# Patient Record
Sex: Male | Born: 1950 | Race: White | Hispanic: No | Smoking: Never smoker
Health system: Southern US, Community
[De-identification: ages and names within clinical notes are randomized; demographics above are authoritative.]

## PROBLEM LIST (undated history)

## (undated) DIAGNOSIS — F329 Major depressive disorder, single episode, unspecified: Secondary | ICD-10-CM

## (undated) DIAGNOSIS — F419 Anxiety disorder, unspecified: Secondary | ICD-10-CM

## (undated) DIAGNOSIS — F32A Depression, unspecified: Secondary | ICD-10-CM

## (undated) DIAGNOSIS — F29 Unspecified psychosis not due to a substance or known physiological condition: Secondary | ICD-10-CM

## (undated) DIAGNOSIS — I10 Essential (primary) hypertension: Secondary | ICD-10-CM

## (undated) DIAGNOSIS — K219 Gastro-esophageal reflux disease without esophagitis: Secondary | ICD-10-CM

## (undated) DIAGNOSIS — M6281 Muscle weakness (generalized): Secondary | ICD-10-CM

## (undated) DIAGNOSIS — E669 Obesity, unspecified: Secondary | ICD-10-CM

## (undated) DIAGNOSIS — G20A1 Parkinson's disease without dyskinesia, without mention of fluctuations: Secondary | ICD-10-CM

## (undated) DIAGNOSIS — Z87442 Personal history of urinary calculi: Secondary | ICD-10-CM

## (undated) DIAGNOSIS — G8929 Other chronic pain: Secondary | ICD-10-CM

## (undated) DIAGNOSIS — M1990 Unspecified osteoarthritis, unspecified site: Secondary | ICD-10-CM

## (undated) DIAGNOSIS — G4730 Sleep apnea, unspecified: Secondary | ICD-10-CM

## (undated) DIAGNOSIS — G20 Parkinson's disease: Secondary | ICD-10-CM

## (undated) DIAGNOSIS — R7303 Prediabetes: Secondary | ICD-10-CM

## (undated) HISTORY — PX: CYSTOSCOPY: SHX5120

## (undated) HISTORY — PX: HERNIA REPAIR: SHX51

---

## 1980-04-04 HISTORY — PX: APPENDECTOMY: SHX54

## 1990-04-04 DIAGNOSIS — Z87442 Personal history of urinary calculi: Secondary | ICD-10-CM

## 1990-04-04 HISTORY — DX: Personal history of urinary calculi: Z87.442

## 2010-11-29 ENCOUNTER — Encounter (HOSPITAL_COMMUNITY)
Admission: RE | Admit: 2010-11-29 | Discharge: 2010-11-29 | Disposition: A | Discharge status: Home / Self Care | Payer: Medicare Other | Source: Ambulatory Visit | Attending: Ophthalmology | Admitting: Ophthalmology

## 2010-11-29 ENCOUNTER — Encounter (HOSPITAL_COMMUNITY): Payer: Self-pay

## 2010-11-29 HISTORY — DX: Anxiety disorder, unspecified: F41.9

## 2010-11-29 HISTORY — DX: Unspecified osteoarthritis, unspecified site: M19.90

## 2010-11-29 LAB — BASIC METABOLIC PANEL
BUN: 14 mg/dL (ref 6–23)
Chloride: 101 mEq/L (ref 96–112)
Creatinine, Ser: 0.83 mg/dL (ref 0.50–1.35)
GFR calc Af Amer: >60 mL/min (ref >60)
Glucose, Bld: 75 mg/dL (ref 70–99)

## 2010-11-29 LAB — CBC
HCT: 43.7 % (ref 39.0–52.0)
MCHC: 33.4 g/dL (ref 30.0–36.0)
MCV: 94.4 fL (ref 78.0–100.0)
RDW: 13.5 % (ref 11.5–15.5)
WBC: 8.3 10*3/uL (ref 4.0–10.5)

## 2010-11-29 NOTE — Patient Instructions (Addendum)
19 Joe [name]  11/29/2010   Your procedure is scheduled on:  12/09/2010  Report to [facility] at 1130  AM.  Call this number if you have problems the morning of surgery: [phone]   Remember:   Do not eat food:After Midnight.  Do not drink clear liquids: After Midnight.  Take these medicines the morning of surgery with A SIP OF WATER:none   Do not wear jewelry, make-up or nail polish.  Do not wear lotions, powders, or perfumes. You may wear deodorant.  Do not shave 48 hours prior to surgery.  Do not bring valuables to the hospital.  Contacts, dentures or bridgework may not be worn into surgery.  Leave suitcase in the car. After surgery it may be brought to your room.  For patients admitted to the hospital, checkout time is 11:00 AM the day of discharge.   Patients discharged the day of surgery will not be allowed to drive home.  Name and phone number of your driver: family  Special Instructions: N/A   Please read over the following fact sheets that you were given: Pain Booklet, Surgical Site Infection Prevention, Anesthesia Post-op Instructions and Care and Recovery After Surgery PATIENT INSTRUCTIONS POST-ANESTHESIA  IMMEDIATELY FOLLOWING SURGERY:  Do not drive or operate machinery for the first twenty four hours after surgery.  Do not make any important decisions for twenty four hours after surgery or while taking narcotic pain medications or sedatives.  If you develop intractable nausea and vomiting or a severe headache please notify your doctor immediately.  FOLLOW-UP:  Please make an appointment with your surgeon as instructed. You do not need to follow up with anesthesia unless specifically instructed to do so.  WOUND CARE INSTRUCTIONS (if applicable):  Keep a dry clean dressing on the anesthesia/puncture wound site if there is drainage.  Once the wound has quit draining you may leave it open to air.  Generally you should leave the bandage intact for twenty four hours unless there  is drainage.  If the epidural site drains for more than 36-48 hours please call the anesthesia department.  QUESTIONS?:  Please feel free to call your physician or the hospital operator if you have any questions, and they will be happy to assist you.     [facility] Anesthesia Department [address] [place] [phone]

## 2010-12-03 ENCOUNTER — Emergency Department (HOSPITAL_COMMUNITY)
Admission: EM | Admit: 2010-12-03 | Discharge: 2010-12-03 | Disposition: A | Discharge status: Home / Self Care | Payer: Medicare Other | Attending: Emergency Medicine | Admitting: Emergency Medicine

## 2010-12-03 ENCOUNTER — Encounter (HOSPITAL_COMMUNITY): Payer: Self-pay | Admitting: *Deleted

## 2010-12-03 DIAGNOSIS — F3289 Other specified depressive episodes: Secondary | ICD-10-CM | POA: Insufficient documentation

## 2010-12-03 DIAGNOSIS — F329 Major depressive disorder, single episode, unspecified: Secondary | ICD-10-CM | POA: Insufficient documentation

## 2010-12-03 NOTE — ED Provider Notes (Signed)
History     CSN: [id number] Arrival date & time: 12/03/2010  7:48 PM  Chief Complaint  Patient presents with  . Depression   HPI Comments: Patient admits to having chronic depression for the last 20 years since his wife left him. He endorses ongoing depression related to the fact that he does not see his children very often. He denies suicidal ideation, hallucinations, overdose, other self-harm. He denies any physical abnormalities either. Symptoms are constant, nothing makes her worse. Currently taking piroxicam which she states does help. He is not scheduled for a followup with a psychiatrist.  The history is provided by the patient.    Past Medical History  Diagnosis Date  . Anxiety   . Arthritis     osteoarthritis  . DJD (degenerative joint disease)     Past Surgical History  Procedure Date  . Cystoscopy     with kidney stone removal-mmh  . Appendectomy 81     mmh  . Hernia repair     umbilical hernia repair-mmh    Family History  Problem Relation Age of Onset  . Anesthesia problems Neg Hx   . Hypotension Neg Hx   . Malignant hyperthermia Neg Hx   . Pseudochol deficiency Neg Hx     History  Substance Use Topics  . Smoking status: Never Smoker   . Smokeless tobacco: Not on file  . Alcohol Use: No      Review of Systems  All other systems reviewed and are negative.    Physical Exam  BP 142/78  Pulse 79  Temp(Src) 98.1 F (36.7 C) (Oral)  Resp 20  Ht 5\' 7"  (1.702 m)  Wt 250 lb (113.399 kg)  BMI 39.16 kg/m2  SpO2 94%  Physical Exam  Nursing note and vitals reviewed. Constitutional: He appears well-developed and well-nourished. No distress.  HENT:  Head: Normocephalic and atraumatic.  Mouth/Throat: Oropharynx is clear and moist. No oropharyngeal exudate.  Eyes: Conjunctivae and EOM are normal. Pupils are equal, round, and reactive to light. Right eye exhibits no discharge. Left eye exhibits no discharge. No scleral icterus.  Neck: Normal range of  motion. Neck supple. No JVD present. No thyromegaly present.  Cardiovascular: Normal rate, regular rhythm, normal heart sounds and intact distal pulses.  Exam reveals no gallop and no friction rub.   No murmur heard. Pulmonary/Chest: Effort normal and breath sounds normal. No respiratory distress. He has no wheezes. He has no rales.  Abdominal: Soft. Bowel sounds are normal. He exhibits no distension and no mass. There is no tenderness.  Musculoskeletal: Normal range of motion. He exhibits no edema and no tenderness.  Lymphadenopathy:    He has no cervical adenopathy.  Neurological: He is alert. Coordination normal.  Skin: Skin is warm and dry. No rash noted. No erythema.  Psychiatric: His behavior is normal. Thought content normal.       Mild depression, no suicidal thoughts, no hallucinations.    ED Course  Procedures  MDM Patient overall is well appearing though he has ongoing mild depression.  I have asked the behavioral health consult service to evaluate him and they agree that he can followup as an outpatient. He agrees to followup on Tuesday with a psychiatric followup surface. There is no risk for suicide at this time.      Vida Roller, MD 12/03/10 2118

## 2010-12-03 NOTE — ED Notes (Signed)
PT SELF AMBULATED OUT WITH A STEADY GAITA STATING NO NEEDS

## 2010-12-03 NOTE — ED Notes (Signed)
C/o depression and feeling nauseated

## 2010-12-09 ENCOUNTER — Encounter (HOSPITAL_COMMUNITY): Payer: Self-pay | Admitting: *Deleted

## 2010-12-09 ENCOUNTER — Encounter (HOSPITAL_COMMUNITY): Payer: Self-pay | Admitting: Anesthesiology

## 2010-12-09 ENCOUNTER — Ambulatory Visit (HOSPITAL_COMMUNITY)
Admission: RE | Admit: 2010-12-09 | Discharge: 2010-12-09 | Disposition: A | Discharge status: Home / Self Care | Payer: Medicare Other | Source: Ambulatory Visit | Attending: Ophthalmology | Admitting: Ophthalmology

## 2010-12-09 ENCOUNTER — Ambulatory Visit (HOSPITAL_COMMUNITY): Payer: Medicare Other | Admitting: Anesthesiology

## 2010-12-09 ENCOUNTER — Encounter (HOSPITAL_COMMUNITY)
Admission: RE | Disposition: A | Discharge status: Home / Self Care | Payer: Self-pay | Source: Ambulatory Visit | Attending: Ophthalmology

## 2010-12-09 DIAGNOSIS — Z01810 Encounter for preprocedural cardiovascular examination: Secondary | ICD-10-CM | POA: Insufficient documentation

## 2010-12-09 DIAGNOSIS — Z01812 Encounter for preprocedural laboratory examination: Secondary | ICD-10-CM | POA: Insufficient documentation

## 2010-12-09 DIAGNOSIS — IMO0002 Reserved for concepts with insufficient information to code with codable children: Secondary | ICD-10-CM | POA: Insufficient documentation

## 2010-12-09 HISTORY — PX: CATARACT EXTRACTION W/PHACO: SHX586

## 2010-12-09 SURGERY — PHACOEMULSIFICATION, CATARACT, WITH IOL INSERTION
Anesthesia: Monitor Anesthesia Care | Site: Eye | Laterality: Right | Wound class: Clean

## 2010-12-09 MED ORDER — MIDAZOLAM HCL 2 MG/2ML IJ SOLN
1.0000 mg | INTRAMUSCULAR | Status: DC | PRN
  Administered 2010-12-09 (×2): 2 mg via INTRAVENOUS

## 2010-12-09 MED ORDER — LIDOCAINE HCL (PF) 1 % IJ SOLN
INTRAMUSCULAR | Status: DC | PRN
  Administered 2010-12-09: .5 mL

## 2010-12-09 MED ORDER — LACTATED RINGERS IV SOLN
INTRAVENOUS | Status: DC
  Administered 2010-12-09: 1000 mL via INTRAVENOUS

## 2010-12-09 MED ORDER — POVIDONE-IODINE 5 % OP SOLN
OPHTHALMIC | Status: DC | PRN
  Administered 2010-12-09: 1 via OPHTHALMIC

## 2010-12-09 MED ORDER — EPINEPHRINE HCL 1 MG/ML IJ SOLN
INTRAOCULAR | Status: DC | PRN
  Administered 2010-12-09: 13:00:00

## 2010-12-09 MED ORDER — PHENYLEPHRINE HCL 2.5 % OP SOLN
1.0000 [drp] | OPHTHALMIC | Status: AC
  Administered 2010-12-09 (×3): 1 [drp] via OPHTHALMIC

## 2010-12-09 MED ORDER — NEOMYCIN-POLYMYXIN-DEXAMETH 0.1 % OP OINT
TOPICAL_OINTMENT | OPHTHALMIC | Status: DC | PRN
  Administered 2010-12-09: 1 via OPHTHALMIC

## 2010-12-09 MED ORDER — FENTANYL CITRATE 0.05 MG/ML IJ SOLN
INTRAMUSCULAR | Status: AC
  Filled 2010-12-09: qty 2

## 2010-12-09 MED ORDER — MIDAZOLAM HCL 5 MG/5ML IJ SOLN
INTRAMUSCULAR | Status: AC
  Administered 2010-12-09: 2 mg via INTRAVENOUS
  Filled 2010-12-09: qty 5

## 2010-12-09 MED ORDER — CYCLOPENTOLATE-PHENYLEPHRINE 0.2-1 % OP SOLN
OPHTHALMIC | Status: AC
  Administered 2010-12-09: 1 [drp] via OPHTHALMIC
  Filled 2010-12-09: qty 2

## 2010-12-09 MED ORDER — LACTATED RINGERS IV SOLN: INTRAVENOUS | Status: DC

## 2010-12-09 MED ORDER — PHENYLEPHRINE HCL 2.5 % OP SOLN
OPHTHALMIC | Status: AC
  Administered 2010-12-09: 1 [drp] via OPHTHALMIC
  Filled 2010-12-09: qty 2

## 2010-12-09 MED ORDER — LIDOCAINE HCL (PF) 1 % IJ SOLN
INTRAMUSCULAR | Status: AC
  Filled 2010-12-09: qty 2

## 2010-12-09 MED ORDER — BSS IO SOLN
INTRAOCULAR | Status: DC | PRN
  Administered 2010-12-09: 15 mL via OPHTHALMIC

## 2010-12-09 MED ORDER — TETRACAINE HCL 0.5 % OP SOLN
1.0000 [drp] | OPHTHALMIC | Status: AC
  Administered 2010-12-09 (×3): 1 [drp] via OPHTHALMIC

## 2010-12-09 MED ORDER — FENTANYL CITRATE 0.05 MG/ML IJ SOLN
INTRAMUSCULAR | Status: DC | PRN
  Administered 2010-12-09 (×2): 50 ug via INTRAVENOUS

## 2010-12-09 MED ORDER — LIDOCAINE HCL 3.5 % OP GEL
OPHTHALMIC | Status: AC
  Filled 2010-12-09: qty 5

## 2010-12-09 MED ORDER — LIDOCAINE HCL 3.5 % OP GEL
OPHTHALMIC | Status: DC | PRN
  Administered 2010-12-09: 1 via OPHTHALMIC

## 2010-12-09 MED ORDER — FENTANYL CITRATE 0.05 MG/ML IJ SOLN: 25.0000 ug | INTRAMUSCULAR | Status: DC | PRN

## 2010-12-09 MED ORDER — TRYPAN BLUE 0.06 % OP SOLN
OPHTHALMIC | Status: DC | PRN
  Administered 2010-12-09: 1 via INTRAOCULAR

## 2010-12-09 MED ORDER — TETRACAINE HCL 0.5 % OP SOLN
OPHTHALMIC | Status: AC
  Administered 2010-12-09: 1 [drp] via OPHTHALMIC
  Filled 2010-12-09: qty 2

## 2010-12-09 MED ORDER — NEOMYCIN-POLYMYXIN-DEXAMETH 3.5-10000-0.1 OP OINT
TOPICAL_OINTMENT | OPHTHALMIC | Status: AC
  Filled 2010-12-09: qty 3.5

## 2010-12-09 MED ORDER — ONDANSETRON HCL 4 MG/2ML IJ SOLN: 4.0000 mg | Freq: Once | INTRAMUSCULAR | Status: DC | PRN

## 2010-12-09 MED ORDER — PROVISC 10 MG/ML IO SOLN
INTRAOCULAR | Status: DC | PRN
  Administered 2010-12-09: 8.5 mg via INTRAOCULAR

## 2010-12-09 MED ORDER — TRYPAN BLUE 0.06 % OP SOLN
OPHTHALMIC | Status: AC
  Filled 2010-12-09: qty 0.5

## 2010-12-09 MED ORDER — EPINEPHRINE HCL 1 MG/ML IJ SOLN
INTRAMUSCULAR | Status: AC
  Filled 2010-12-09: qty 1

## 2010-12-09 MED ORDER — CYCLOPENTOLATE-PHENYLEPHRINE 0.2-1 % OP SOLN
1.0000 [drp] | OPHTHALMIC | Status: AC
  Administered 2010-12-09 (×3): 1 [drp] via OPHTHALMIC

## 2010-12-09 MED ORDER — LIDOCAINE HCL 3.5 % OP GEL: 1.0000 "application " | Freq: Once | OPHTHALMIC | Status: DC

## 2010-12-09 SURGICAL SUPPLY — 34 items
CAPSULAR TENSION RING-AMO (OPHTHALMIC RELATED) IMPLANT
CLOTH BEACON ORANGE TIMEOUT ST (SAFETY) ×1 IMPLANT
DUOVISC SYSTEM (INTRAOCULAR LENS)
EYE SHIELD UNIVERSAL CLEAR (GAUZE/BANDAGES/DRESSINGS) ×1 IMPLANT
GLOVE BIO SURGEON STRL SZ 6.5 (GLOVE) IMPLANT
GLOVE BIOGEL PI IND STRL 6.5 (GLOVE) IMPLANT
GLOVE BIOGEL PI IND STRL 7.0 (GLOVE) IMPLANT
GLOVE BIOGEL PI IND STRL 7.5 (GLOVE) IMPLANT
GLOVE BIOGEL PI INDICATOR 6.5 (GLOVE)
GLOVE BIOGEL PI INDICATOR 7.0 (GLOVE)
GLOVE BIOGEL PI INDICATOR 7.5 (GLOVE) ×1
GLOVE ECLIPSE 6.5 STRL STRAW (GLOVE) IMPLANT
GLOVE ECLIPSE 7.0 STRL STRAW (GLOVE) ×1 IMPLANT
GLOVE ECLIPSE 7.5 STRL STRAW (GLOVE) IMPLANT
GLOVE EXAM NITRILE LRG STRL (GLOVE) ×1 IMPLANT
GLOVE EXAM NITRILE MD LF STRL (GLOVE) IMPLANT
GLOVE SKINSENSE NS SZ6.5 (GLOVE)
GLOVE SKINSENSE NS SZ7.0 (GLOVE)
GLOVE SKINSENSE STRL SZ6.5 (GLOVE) IMPLANT
GLOVE SKINSENSE STRL SZ7.0 (GLOVE) IMPLANT
KIT VITRECTOMY (OPHTHALMIC RELATED) IMPLANT
PAD ARMBOARD 7.5X6 YLW CONV (MISCELLANEOUS) ×1 IMPLANT
PROC W NO LENS (INTRAOCULAR LENS)
PROC W SPEC LENS (INTRAOCULAR LENS)
PROCESS W NO LENS (INTRAOCULAR LENS) IMPLANT
PROCESS W SPEC LENS (INTRAOCULAR LENS) IMPLANT
RING MALYGIN (MISCELLANEOUS) IMPLANT
SIGHTPATH CAT PROC W REG LENS (Ophthalmic Related) ×2 IMPLANT
SYR TB 1ML LL NO SAFETY (SYRINGE) ×1 IMPLANT
SYSTEM DUOVISC (INTRAOCULAR LENS) IMPLANT
TAPE SURG TRANSPORE 1 IN (GAUZE/BANDAGES/DRESSINGS) IMPLANT
TAPE SURGICAL TRANSPORE 1 IN (GAUZE/BANDAGES/DRESSINGS) ×1
VISCOELASTIC ADDITIONAL (OPHTHALMIC RELATED) IMPLANT
WATER STERILE IRR 250ML POUR (IV SOLUTION) ×1 IMPLANT

## 2010-12-09 NOTE — Anesthesia Postprocedure Evaluation (Signed)
  Anesthesia Post-op Note  Patient: Joe [name]  Procedure(s) Performed:  CATARACT EXTRACTION PHACO AND INTRAOCULAR LENS PLACEMENT (IOC) - CDE: 36.21  Patient Location:  Short Stay  Anesthesia Type: MAC  Level of Consciousness: awake  Airway and Oxygen Therapy: Patient Spontanous Breathing  Post-op Pain: none  Post-op Assessment: Post-op Vital signs reviewed, Patient's Cardiovascular Status Stable, Respiratory Function Stable, Patent Airway, No signs of Nausea or vomiting and Pain level controlled  Post-op Vital Signs: Reviewed and stable  Complications: No apparent anesthesia complications

## 2010-12-09 NOTE — H&P (Signed)
I have evaluated the patient preoperatively, and have identified no interval changes in medical condition and plan of care since the history and physical of record 

## 2010-12-09 NOTE — Brief Op Note (Signed)
Pre-Op Dx: Mature Cataract OD Post-Op Dx: Mature Cataract OD Surgeon: Gemma Payor Anesthesia: Topical with MAC Implant: Lenstec, Model Softec HD Blood Loss: None Specimen: None Complications: None

## 2010-12-09 NOTE — Anesthesia Procedure Notes (Signed)
Procedure Name: MAC Date/Time: 12/09/2010 12:31 PM Performed by: Minerva Areola Pre-anesthesia Checklist: Suction available, Emergency Drugs available, Patient identified, Patient being monitored and Timeout performed Oxygen Delivery Method: Nasal Cannula

## 2010-12-09 NOTE — Transfer of Care (Signed)
Immediate Anesthesia Transfer of Care Note  Patient: Joe [name]  Procedure(s) Performed:  CATARACT EXTRACTION PHACO AND INTRAOCULAR LENS PLACEMENT (IOC) - CDE: 36.21  Patient Location: Shortstay  Anesthesia Type: MAC  Level of Consciousness: awake  Airway & Oxygen Therapy: Patient Spontanous Breathing   Post-op Assessment: Report given to PACU RN, Post -op Vital signs reviewed and stable and Patient moving all extremities  Post vital signs: Reviewed and stable  Complications: No apparent anesthesia complications

## 2010-12-09 NOTE — Op Note (Signed)
NAME:  Joe [name], Joe [name] NO.:  [id number]  MEDICAL RECORD NO.:  [id number]  LOCATION:  [facility]                          FACILITY:  [facility]  PHYSICIAN:  Susanne Greenhouse, MD       DATE OF BIRTH:  [birth year]  DATE OF PROCEDURE:  12/09/2010 DATE OF DISCHARGE:  12/09/2010                              OPERATIVE REPORT   PREOPERATIVE DIAGNOSIS:  Mature cataract, right eye.  POSTOPERATIVE DIAGNOSIS:  Mature cataract, right eye.  DIAGNOSIS CODE:  366.17.  SURGEON:  Susanne Greenhouse, MD.  ANESTHESIA:  Topical with monitored anesthesia care.  DESCRIPTION OF THE OPERATION:  In the preoperative holding area, dilating drops and viscous lidocaine were placed into the right eye. The patient was then brought to the operating room where he was prepped and draped.  Beginning with 75 blade, a paracentesis port was made at the surgeon's 2 o'clock position.  The anterior chamber was then filled with a 1% nonpreserved lidocaine solution.  This was followed by small air bubble and vision blue.  The vision blue was then displaced from the anterior chamber with balanced salt solution.  The anterior chamber was then filled with Provisc.  A 2.4-mm keratome blade was then used to make a clear corneal incision at the temporal limbus.  A bent cystotome needle and Utrata forceps were used to create a continuous tear capsulotomy.  The lens nucleus was then removed using phacoemulsification and a phaco flip technique.  After removal of the nucleus, there really was no cortex to be removed and so the capsular bag and anterior chamber were refilled with Provisc.  The posterior chamber interocular lens was placed into the capsular bag without difficulty using a lens injecting system.  The Provisc was then removed from the capsular bag and anterior chamber with irrigation and aspiration.  Stromal hydration of the main incision and paracentesis ports was performed with balanced salt solution on a fine cannula.   The wounds were tested for leak which were negative.  The patient tolerated the procedure well.  There were no operative complications and he was returned to the recovery area in satisfactory condition.  No surgical specimens.  Prosthetic device used is a Lenstec posterior chamber lens, model Softec HD, power of 18.0, serial number is [id number].         ______________________________ Susanne Greenhouse, MD    KEH/MEDQ  D:  12/09/2010  T:  12/09/2010  Job:  [id number]

## 2010-12-09 NOTE — Anesthesia Preprocedure Evaluation (Addendum)
Anesthesia Evaluation  Name, MR# and DOB Patient awake  General Assessment Comment  Reviewed: Allergy & Precautions, H&P , NPO status , Patient's Chart, lab work & pertinent test results  Airway Mallampati: III    Mouth opening: Limited Mouth Opening  Dental  (+) Edentulous Upper and Edentulous Lower   Pulmonary      Cardiovascular Regular Normal    Neuro/Psych    (+) Anxiety,    GI/Hepatic/Renal        GERD    Renal disease (stones)  Endo/Other    Abdominal   Musculoskeletal  (+) Arthritis -, Osteoarthritis,    Hematology   Peds  Reproductive/Obstetrics    Anesthesia Other Findings             Anesthesia Physical Anesthesia Plan  ASA: III  Anesthesia Plan: MAC   Post-op Pain Management:    Induction: Intravenous  Airway Management Planned: Nasal Cannula  Additional Equipment:   Intra-op Plan:   Post-operative Plan:   Informed Consent: I have reviewed the patients History and Physical, chart, labs and discussed the procedure including the risks, benefits and alternatives for the proposed anesthesia with the patient or authorized representative who has indicated his/her understanding and acceptance.     Plan Discussed with:   Anesthesia Plan Comments:         Anesthesia Quick Evaluation

## 2010-12-10 NOTE — Progress Notes (Signed)
No answer at provided number.

## 2010-12-15 ENCOUNTER — Encounter (HOSPITAL_COMMUNITY): Payer: Self-pay | Admitting: Ophthalmology

## 2011-08-19 ENCOUNTER — Ambulatory Visit (HOSPITAL_COMMUNITY)
Admission: RE | Admit: 2011-08-19 | Discharge: 2011-08-19 | Disposition: A | Discharge status: Home / Self Care | Payer: Medicare Other | Source: Ambulatory Visit | Attending: Physician Assistant | Admitting: Physician Assistant

## 2011-08-19 ENCOUNTER — Other Ambulatory Visit (HOSPITAL_COMMUNITY): Payer: Self-pay | Admitting: Physician Assistant

## 2011-08-19 DIAGNOSIS — M159 Polyosteoarthritis, unspecified: Secondary | ICD-10-CM

## 2011-08-19 DIAGNOSIS — M25569 Pain in unspecified knee: Secondary | ICD-10-CM | POA: Insufficient documentation

## 2011-08-19 DIAGNOSIS — M25469 Effusion, unspecified knee: Secondary | ICD-10-CM | POA: Insufficient documentation

## 2011-08-25 NOTE — H&P (Signed)
  NTS SOAP Note  Vital Signs:  Vitals as of: 08/25/2011: Systolic 134: Diastolic 80: Heart Rate 89: Temp 98.78F: Height 88ft 7in: Weight 273Lbs 0 Ounces: BMI 43  BMI : 42.76 kg/m2  Subjective: This 61 Years 70 Months old Male presents for screening TCS.  Denies any GI complaints.  No family h/o colon cancer.  Never has had a TCS.   Review of Symptoms:  Constitutional:unremarkable   Head:unremarkable    Eyes:unremarkable   Nose/Mouth/Throat:unremarkable Cardiovascular:  unremarkable   Respiratory:unremarkable   Gastrointestinal:  unremarkable   Genitourinary:unremarkable       joint and back pain Skin:unremarkable Hematolgic/Lymphatic:unremarkable     Allergic/Immunologic:unremarkable     Past Medical History:    Reviewed   Past Medical History  Surgical History: appy, herniorrhaphy Psychiatric History:  Anxiety, Depression Allergies: nkda Medications: xanax, paxil, abilify, abumetone   Social History:Reviewed  Social History  Preferred Language: English ([place]) Race:  White Ethnicity: Not Hispanic / Latino Age: 61 Years 0 Months Marital Status:  S Alcohol:  No Recreational drug(s):  No   Smoking Status: Never smoker reviewed on 08/25/2011  Family History:  Reviewed   Family History  Is there a family history of:No family h/o colon cancer             Mother:  Cancer-breast             Sibling:  Cancer-breast    Objective Information: General:  Well appearing, well nourished in no distress. Head:Atraumatic; no masses; no abnormalities Neck:  Supple without lymphadenopathy.  Heart:  RRR, no murmur Lungs:    CTA bilaterally, no wheezes, rhonchi, rales.  Breathing unlabored. Abdomen:Soft, NT/ND, no HSM, no masses.   deferred to procedure  Assessment:Need for screening TCS  Diagnosis &amp; Procedure: DiagnosisCode: V76.51, ProcedureCode: 40981,    Plan:Scheduled for TCS  on 09/20/11.   Patient Education:Alternative treatments to surgery were discussed with patient (and family).  Risks and benefits  of procedure were fully explained to the patient (and family) who gave informed consent. Patient/family questions were addressed.  Follow-up:Pending Surgery

## 2011-09-13 ENCOUNTER — Encounter (HOSPITAL_COMMUNITY): Payer: Self-pay

## 2011-09-19 MED ORDER — SODIUM CHLORIDE 0.45 % IV SOLN
Freq: Once | INTRAVENOUS | Status: AC
  Administered 2011-09-20: 08:00:00 via INTRAVENOUS

## 2011-09-20 ENCOUNTER — Encounter (HOSPITAL_COMMUNITY): Payer: Self-pay | Admitting: *Deleted

## 2011-09-20 ENCOUNTER — Ambulatory Visit (HOSPITAL_COMMUNITY)
Admission: RE | Admit: 2011-09-20 | Discharge: 2011-09-20 | Disposition: A | Discharge status: Home / Self Care | Payer: Medicare Other | Source: Ambulatory Visit | Attending: General Surgery | Admitting: General Surgery

## 2011-09-20 ENCOUNTER — Encounter (HOSPITAL_COMMUNITY)
Admission: RE | Disposition: A | Discharge status: Home / Self Care | Payer: Self-pay | Source: Ambulatory Visit | Attending: General Surgery

## 2011-09-20 DIAGNOSIS — D126 Benign neoplasm of colon, unspecified: Secondary | ICD-10-CM | POA: Insufficient documentation

## 2011-09-20 DIAGNOSIS — Z1211 Encounter for screening for malignant neoplasm of colon: Secondary | ICD-10-CM | POA: Insufficient documentation

## 2011-09-20 HISTORY — PX: COLONOSCOPY: SHX5424

## 2011-09-20 SURGERY — COLONOSCOPY
Anesthesia: Moderate Sedation

## 2011-09-20 MED ORDER — STERILE WATER FOR IRRIGATION IR SOLN
Status: DC | PRN
  Administered 2011-09-20: 09:00:00

## 2011-09-20 MED ORDER — MIDAZOLAM HCL 5 MG/5ML IJ SOLN
INTRAMUSCULAR | Status: AC
  Filled 2011-09-20: qty 10

## 2011-09-20 MED ORDER — MEPERIDINE HCL 25 MG/ML IJ SOLN
INTRAMUSCULAR | Status: DC | PRN
  Administered 2011-09-20: 50 mg via INTRAVENOUS

## 2011-09-20 MED ORDER — MIDAZOLAM HCL 5 MG/5ML IJ SOLN
INTRAMUSCULAR | Status: DC | PRN
  Administered 2011-09-20: 4 mg via INTRAVENOUS
  Administered 2011-09-20: 1 mg via INTRAVENOUS

## 2011-09-20 MED ORDER — MEPERIDINE HCL 100 MG/ML IJ SOLN
INTRAMUSCULAR | Status: AC
  Filled 2011-09-20: qty 1

## 2011-09-20 NOTE — Discharge Instructions (Signed)
Colonoscopy Care After Read the instructions outlined below and refer to this sheet in the next few weeks. These discharge instructions provide you with general information on caring for yourself after you leave the hospital. Your doctor may also give you specific instructions. While your treatment has been planned according to the most current medical practices available, unavoidable complications occasionally occur. If you have any problems or questions after discharge, call your doctor. HOME CARE INSTRUCTIONS ACTIVITY:  You may resume your regular activity, but move at a slower pace for the next 24 hours.   Take frequent rest periods for the next 24 hours.   Walking will help get rid of the air and reduce the bloated feeling in your belly (abdomen).   No driving for 24 hours (because of the medicine (anesthesia) used during the test).   You may shower.   Do not sign any important legal documents or operate any machinery for 24 hours (because of the anesthesia used during the test).  NUTRITION:  Drink plenty of fluids.   You may resume your normal diet as instructed by your doctor.   Begin with a light meal and progress to your normal diet. Heavy or fried foods are harder to digest and may make you feel sick to your stomach (nauseated).   Avoid alcoholic beverages for 24 hours or as instructed.  MEDICATIONS:  You may resume your normal medications unless your doctor tells you otherwise.  WHAT TO EXPECT TODAY:  Some feelings of bloating in the abdomen.   Passage of more gas than usual.   Spotting of blood in your stool or on the toilet paper.  IF YOU HAD POLYPS REMOVED DURING THE COLONOSCOPY:  No aspirin products for 7 days or as instructed.   No alcohol for 7 days or as instructed.   Eat a soft diet for the next 24 hours.  FINDING OUT THE RESULTS OF YOUR TEST Not all test results are available during your visit. If your test results are not back during the visit, make an  appointment with your caregiver to find out the results. Do not assume everything is normal if you have not heard from your caregiver or the medical facility. It is important for you to follow up on all of your test results.  SEEK IMMEDIATE MEDICAL CARE IF:  You have more than a spotting of blood in your stool.   Your belly is swollen (abdominal distention).   You are nauseated or vomiting.   You have a fever.   You have abdominal pain or discomfort that is severe or gets worse throughout the day.  Document Released: 11/03/2003 Document Revised: 03/10/2011 Document Reviewed: 11/01/2007 Exit[employer] Patient Information 2012 [employer]. 

## 2011-09-20 NOTE — Interval H&P Note (Signed)
History and Physical Interval Note:  09/20/2011 9:00 AM  Joe [name]  has presented today for surgery, with the diagnosis of Special screening for malignant neoplasms, colon  The various methods of treatment have been discussed with the patient and family. After consideration of risks, benefits and other options for treatment, the patient has consented to  Procedure(s) (LRB): COLONOSCOPY (N/A) as a surgical intervention .  The patient's history has been reviewed, patient examined, no change in status, stable for surgery.  I have reviewed the patients' chart and labs.  Questions were answered to the patient's satisfaction.     Franky Macho A

## 2011-09-20 NOTE — Op Note (Signed)
[facility] [address] [place], [place]  [address]  COLONOSCOPY PROCEDURE REPORT  PATIENT:  Joe [name], Joe [name]  MR#:  [id number] BIRTHDATE:  14-Jun-1950, 61 yrs. old  GENDER:  male ENDOSCOPIST:  Franky Macho, MD REF. BY:  Karleen Hampshire, M.D. PROCEDURE DATE:  09/20/2011 PROCEDURE:  Colonoscopy with snare polypectomy ASA CLASS:  Class II INDICATIONS:  Screening MEDICATIONS:   Versed 5 mg IV, demerol 50 mg IV  DESCRIPTION OF PROCEDURE:   After the risks benefits and alternatives of the procedure were thoroughly explained, informed consent was obtained.  Digital rectal exam was performed and revealed no abnormalities.   The EC-[id number] ([id number]) endoscope was introduced through the anus and advanced to the cecum, which was identified by both the appendix and ileocecal valve, without limitations.  The quality of the prep was adequate..  The instrument was then slowly withdrawn as the colon was fully examined. <<PROCEDUREIMAGES>>  FINDINGS:  A sessile polyp was found in the distal transverse colon (see image001 and image002).   Retroflexed views in the rectum revealed no abnormalities.   The scope was then withdrawn from the cecum and the procedure completed. COMPLICATIONS:  None ENDOSCOPIC IMPRESSION: 1) Sessile polyp in the distal transverse colon 2) Normal colonoscopy otherwise RECOMMENDATIONS: 1) Await pathology results REPEAT EXAM:  In 3 year(s) for Colonoscopy.  ______________________________ Franky Macho, MD  CC:  Karleen Hampshire, MD  n. Rosalie DoctorFranky Macho at 09/20/2011 09:21 AM  Edward Qualia, [phone]

## 2011-09-26 ENCOUNTER — Encounter (HOSPITAL_COMMUNITY): Payer: Self-pay | Admitting: General Surgery

## 2011-10-17 ENCOUNTER — Encounter (HOSPITAL_COMMUNITY): Payer: Self-pay | Admitting: Pharmacy Technician

## 2011-10-19 ENCOUNTER — Other Ambulatory Visit: Payer: Self-pay

## 2011-10-19 ENCOUNTER — Encounter (HOSPITAL_COMMUNITY): Payer: Self-pay

## 2011-10-19 ENCOUNTER — Encounter (HOSPITAL_COMMUNITY)
Admission: RE | Admit: 2011-10-19 | Discharge: 2011-10-19 | Disposition: A | Discharge status: Home / Self Care | Payer: Medicare Other | Source: Ambulatory Visit | Attending: Ophthalmology | Admitting: Ophthalmology

## 2011-10-19 HISTORY — DX: Obesity, unspecified: E66.9

## 2011-10-19 HISTORY — DX: Sleep apnea, unspecified: G47.30

## 2011-10-19 HISTORY — DX: Essential (primary) hypertension: I10

## 2011-10-19 HISTORY — DX: Personal history of urinary calculi: Z87.442

## 2011-10-19 HISTORY — DX: Prediabetes: R73.03

## 2011-10-19 LAB — BASIC METABOLIC PANEL
BUN: 13 mg/dL (ref 6–23)
CO2: 24 mEq/L (ref 19–32)
Chloride: 106 mEq/L (ref 96–112)
GFR calc non Af Amer: 77 mL/min — ABNORMAL LOW (ref >90)
Glucose, Bld: 111 mg/dL — ABNORMAL HIGH (ref 70–99)
Potassium: 4.1 mEq/L (ref 3.5–5.1)

## 2011-10-19 NOTE — Progress Notes (Signed)
10/19/11 1110  OBSTRUCTIVE SLEEP APNEA  Have you ever been diagnosed with sleep apnea through a sleep study? No  Do you snore loudly (loud enough to be heard through closed doors)?  1  Do you often feel tired, fatigued, or sleepy during the daytime? 1  Has anyone observed you stop breathing during your sleep? 0  Do you have, or are you being treated for high blood pressure? 1  BMI more than 35 kg/m2? 1  Age over 62 years old? 1  Neck circumference greater than 40 cm/18 inches? 1  Gender: 1  Obstructive Sleep Apnea Score 7   Score 4 or greater  Updated health history;Results sent to PCP

## 2011-10-19 NOTE — Patient Instructions (Signed)
Your procedure is scheduled on:  Monday, 10/24/11  Report to [facility] at   0930     AM.  Call this number if you have problems the morning of surgery: [phone]   Remember:   Do not eat or drink   After Midnight.  Take these medicines the morning of surgery with A SIP OF WATER: paxil, abilify, and xanax if needed   Do not wear jewelry, make-up or nail polish.  Do not wear lotions, powders, or perfumes. You may wear deodorant.  Do not bring valuables to the hospital.  Contacts, dentures or bridgework may not be worn into surgery.     Patients discharged the day of surgery will not be allowed to drive home.  Name and phone number of your driver: family  Special Instructions: Use eye drops as directed.   Please read over the following fact sheets that you were given: Pain Booklet, Anesthesia Post-op Instructions and Care and Recovery After Surgery    Cataract Surgery  A cataract is a clouding of the lens of the eye. When a lens becomes cloudy, vision is reduced based on the degree and nature of the clouding. Surgery may be needed to improve vision. Surgery removes the cloudy lens and usually replaces it with a substitute lens (intraocular lens, IOL). LET YOUR EYE DOCTOR KNOW ABOUT:  Allergies to food or medicine.   Medicines taken including herbs, eyedrops, over-the-counter medicines, and creams.   Use of steroids (by mouth or creams).   Previous problems with anesthetics or numbing medicine.   History of bleeding problems or blood clots.   Previous surgery.   Other health problems, including diabetes and kidney problems.   Possibility of pregnancy, if this applies.  RISKS AND COMPLICATIONS  Infection.   Inflammation of the eyeball (endophthalmitis) that can spread to both eyes (sympathetic ophthalmia).   Poor wound healing.   If an IOL is inserted, it can later fall out of proper position. This is very uncommon.   Clouding of the part of your eye that holds an IOL in  place. This is called an "after-cataract." These are uncommon, but easily treated.  BEFORE THE PROCEDURE  Do not eat or drink anything except small amounts of water for 8 to 12 before your surgery, or as directed by your caregiver.   Unless you are told otherwise, continue any eyedrops you have been prescribed.   Talk to your primary caregiver about all other medicines that you take (both prescription and non-prescription). In some cases, you may need to stop or change medicines near the time of your surgery. This is most important if you are taking blood-thinning medicine.Do not stop medicines unless you are told to do so.   Arrange for someone to drive you to and from the procedure.   Do not put contact lenses in either eye on the day of your surgery.  PROCEDURE There is more than one method for safely removing a cataract. Your doctor can explain the differences and help determine which is best for you. Phacoemulsification surgery is the most common form of cataract surgery.  An injection is given behind the eye or eyedrops are given to make this a painless procedure.   A small cut (incision) is made on the edge of the clear, dome-shaped surface that covers the front of the eye (cornea).   A tiny probe is painlessly inserted into the eye. This device gives off ultrasound waves that soften and break up the cloudy center of  the lens. This makes it easier for the cloudy lens to be removed by suction.   An IOL may be implanted.   The normal lens of the eye is covered by a clear capsule. Part of that capsule is intentionally left in the eye to support the IOL.   Your surgeon may or may not use stitches to close the incision.  There are other forms of cataract surgery that require a larger incision and stiches to close the eye. This approach is taken in cases where the doctor feels that the cataract cannot be easily removed using phacoemulsification. AFTER THE PROCEDURE  When an IOL is  implanted, it does not need care. It becomes a permanent part of your eye and cannot be seen or felt.   Your doctor will schedule follow-up exams to check on your progress.   Review your other medicines with your doctor to see which can be resumed after surgery.   Use eyedrops or take medicine as prescribed by your doctor.  Document Released: 03/10/2011 Document Reviewed: 03/07/2011 [facility] Patient Information 2012 [place], [place].  PATIENT INSTRUCTIONS POST-ANESTHESIA  IMMEDIATELY FOLLOWING SURGERY:  Do not drive or operate machinery for the first twenty four hours after surgery.  Do not make any important decisions for twenty four hours after surgery or while taking narcotic pain medications or sedatives.  If you develop intractable nausea and vomiting or a severe headache please notify your doctor immediately.  FOLLOW-UP:  Please make an appointment with your surgeon as instructed. You do not need to follow up with anesthesia unless specifically instructed to do so.  WOUND CARE INSTRUCTIONS (if applicable):  Keep a dry clean dressing on the anesthesia/puncture wound site if there is drainage.  Once the wound has quit draining you may leave it open to air.  Generally you should leave the bandage intact for twenty four hours unless there is drainage.  If the epidural site drains for more than 36-48 hours please call the anesthesia department.  QUESTIONS?:  Please feel free to call your physician or the hospital operator if you have any questions, and they will be happy to assist you.

## 2011-10-21 MED ORDER — PHENYLEPHRINE HCL 2.5 % OP SOLN
OPHTHALMIC | Status: AC
  Filled 2011-10-21: qty 2

## 2011-10-21 MED ORDER — LIDOCAINE HCL (PF) 1 % IJ SOLN
INTRAMUSCULAR | Status: AC
  Filled 2011-10-21: qty 2

## 2011-10-21 MED ORDER — NEOMYCIN-POLYMYXIN-DEXAMETH 3.5-10000-0.1 OP OINT
TOPICAL_OINTMENT | OPHTHALMIC | Status: AC
  Filled 2011-10-21: qty 3.5

## 2011-10-21 MED ORDER — CYCLOPENTOLATE-PHENYLEPHRINE 0.2-1 % OP SOLN
OPHTHALMIC | Status: AC
  Filled 2011-10-21: qty 2

## 2011-10-21 MED ORDER — LIDOCAINE HCL 3.5 % OP GEL
OPHTHALMIC | Status: AC
  Filled 2011-10-21: qty 5

## 2011-10-21 MED ORDER — TETRACAINE HCL 0.5 % OP SOLN
OPHTHALMIC | Status: AC
  Filled 2011-10-21: qty 2

## 2011-10-24 ENCOUNTER — Ambulatory Visit (HOSPITAL_COMMUNITY)
Admission: RE | Admit: 2011-10-24 | Discharge: 2011-10-24 | Disposition: A | Discharge status: Home / Self Care | Payer: Medicare Other | Source: Ambulatory Visit | Attending: Ophthalmology | Admitting: Ophthalmology

## 2011-10-24 ENCOUNTER — Ambulatory Visit (HOSPITAL_COMMUNITY): Payer: Medicare Other | Admitting: Anesthesiology

## 2011-10-24 ENCOUNTER — Encounter (HOSPITAL_COMMUNITY)
Admission: RE | Disposition: A | Discharge status: Home / Self Care | Payer: Self-pay | Source: Ambulatory Visit | Attending: Ophthalmology

## 2011-10-24 ENCOUNTER — Encounter (HOSPITAL_COMMUNITY): Payer: Self-pay | Admitting: Anesthesiology

## 2011-10-24 ENCOUNTER — Encounter (HOSPITAL_COMMUNITY): Payer: Self-pay | Admitting: *Deleted

## 2011-10-24 DIAGNOSIS — Z01810 Encounter for preprocedural cardiovascular examination: Secondary | ICD-10-CM | POA: Insufficient documentation

## 2011-10-24 DIAGNOSIS — H2589 Other age-related cataract: Secondary | ICD-10-CM | POA: Insufficient documentation

## 2011-10-24 DIAGNOSIS — Z01812 Encounter for preprocedural laboratory examination: Secondary | ICD-10-CM | POA: Insufficient documentation

## 2011-10-24 DIAGNOSIS — I10 Essential (primary) hypertension: Secondary | ICD-10-CM | POA: Insufficient documentation

## 2011-10-24 DIAGNOSIS — Z79899 Other long term (current) drug therapy: Secondary | ICD-10-CM | POA: Insufficient documentation

## 2011-10-24 HISTORY — PX: CATARACT EXTRACTION W/PHACO: SHX586

## 2011-10-24 SURGERY — PHACOEMULSIFICATION, CATARACT, WITH IOL INSERTION
Anesthesia: Monitor Anesthesia Care | Site: Eye | Laterality: Left | Wound class: Clean

## 2011-10-24 MED ORDER — BSS IO SOLN
INTRAOCULAR | Status: DC | PRN
  Administered 2011-10-24: 15 mL via INTRAOCULAR

## 2011-10-24 MED ORDER — LACTATED RINGERS IV SOLN
INTRAVENOUS | Status: DC
  Administered 2011-10-24: 1000 mL via INTRAVENOUS

## 2011-10-24 MED ORDER — MIDAZOLAM HCL 2 MG/2ML IJ SOLN
1.0000 mg | INTRAMUSCULAR | Status: DC | PRN
  Administered 2011-10-24: 2 mg via INTRAVENOUS

## 2011-10-24 MED ORDER — POVIDONE-IODINE 5 % OP SOLN
OPHTHALMIC | Status: DC | PRN
  Administered 2011-10-24: 1 via OPHTHALMIC

## 2011-10-24 MED ORDER — LIDOCAINE 3.5 % OP GEL OPTIME - NO CHARGE
OPHTHALMIC | Status: DC | PRN
  Administered 2011-10-24: 2 [drp] via OPHTHALMIC

## 2011-10-24 MED ORDER — NEOMYCIN-POLYMYXIN-DEXAMETH 0.1 % OP OINT
TOPICAL_OINTMENT | OPHTHALMIC | Status: DC | PRN
  Administered 2011-10-24: 1 via OPHTHALMIC

## 2011-10-24 MED ORDER — TETRACAINE HCL 0.5 % OP SOLN
1.0000 [drp] | OPHTHALMIC | Status: AC
  Administered 2011-10-24 (×3): 1 [drp] via OPHTHALMIC

## 2011-10-24 MED ORDER — PROVISC 10 MG/ML IO SOLN
INTRAOCULAR | Status: DC | PRN
  Administered 2011-10-24: 8.5 mg via INTRAOCULAR

## 2011-10-24 MED ORDER — LIDOCAINE HCL 3.5 % OP GEL
1.0000 "application " | Freq: Once | OPHTHALMIC | Status: AC
  Administered 2011-10-24: 1 via OPHTHALMIC

## 2011-10-24 MED ORDER — EPINEPHRINE HCL 1 MG/ML IJ SOLN
INTRAOCULAR | Status: DC | PRN
  Administered 2011-10-24: 11:00:00

## 2011-10-24 MED ORDER — PHENYLEPHRINE HCL 2.5 % OP SOLN
1.0000 [drp] | OPHTHALMIC | Status: AC
  Administered 2011-10-24 (×3): 1 [drp] via OPHTHALMIC

## 2011-10-24 MED ORDER — MIDAZOLAM HCL 2 MG/2ML IJ SOLN
INTRAMUSCULAR | Status: AC
  Filled 2011-10-24: qty 2

## 2011-10-24 MED ORDER — LIDOCAINE HCL (PF) 1 % IJ SOLN
INTRAMUSCULAR | Status: DC | PRN
  Administered 2011-10-24: .6 mL

## 2011-10-24 MED ORDER — FENTANYL CITRATE 0.05 MG/ML IJ SOLN: 25.0000 ug | INTRAMUSCULAR | Status: DC | PRN

## 2011-10-24 MED ORDER — CYCLOPENTOLATE-PHENYLEPHRINE 0.2-1 % OP SOLN
1.0000 [drp] | OPHTHALMIC | Status: AC
  Administered 2011-10-24 (×3): 1 [drp] via OPHTHALMIC

## 2011-10-24 MED ORDER — ONDANSETRON HCL 4 MG/2ML IJ SOLN: 4.0000 mg | Freq: Once | INTRAMUSCULAR | Status: DC | PRN

## 2011-10-24 SURGICAL SUPPLY — 32 items

## 2011-10-24 NOTE — Anesthesia Procedure Notes (Signed)
Procedure Name: MAC Date/Time: 10/24/2011 10:39 AM Performed by: Franco Nones Pre-anesthesia Checklist: Patient identified, Emergency Drugs available, Suction available, Timeout performed and Patient being monitored Patient Re-evaluated:Patient Re-evaluated prior to inductionOxygen Delivery Method: Nasal Cannula

## 2011-10-24 NOTE — H&P (Signed)
I have reviewed the H&P, the patient was re-examined, and I have identified no interval changes in medical condition and plan of care since the history and physical of record  

## 2011-10-24 NOTE — Op Note (Signed)
[name], Joe [name] NO.:  [id number]  MEDICAL RECORD NO.:  [id number]  LOCATION:  [facility]                          FACILITY:  [facility]  PHYSICIAN:  Susanne Greenhouse, MD       DATE OF BIRTH:  Jul 21, 1950  DATE OF PROCEDURE:  10/24/2011 DATE OF DISCHARGE:  10/24/2011                              OPERATIVE REPORT   PREOPERATIVE DIAGNOSIS:  Combined cataract, left eye.  POSTOPERATIVE DIAGNOSIS:  Combined cataract, left eye.  DIAGNOSIS CODE:  366.19.  OPERATION PERFORMED:  Phacoemulsification with posterior chamber intraocular lens implantation, left eye.  SURGEON:  Bonne Dolores. Ednamae Schiano, MD.  OPERATIVE SUMMARY:  In the preoperative area, dilating drops were placed into the left eye.  The patient was then brought into the operating room where he was placed under general anesthesia.  The eye was then prepped and draped.  Beginning with a 75 blade, a paracentesis port was made at the surgeon's 2 o'clock position.  The anterior chamber was then filled with a 1% nonpreserved lidocaine solution with epinephrine.  This was followed by Viscoat to deepen the chamber.  A small fornix-based peritomy was performed superiorly.  Next, a single iris hook was placed through the limbus superiorly.  A 2.4-mm keratome blade was then used to make a clear corneal incision over the iris hook.  A bent cystotome needle and Utrata forceps were used to create a continuous tear capsulotomy.  Hydrodissection was performed using balanced salt solution on a fine cannula.  The lens nucleus was then removed using phacoemulsification in a quadrant cracking technique.  The cortical material was then removed with irrigation and aspiration.  The capsular bag and anterior chamber were refilled with Provisc.  The wound was widened to approximately 3 mm and a posterior chamber intraocular lens was placed into the capsular bag without difficulty using an [employer] lens injecting system.  A single 10-0 nylon  suture was then used to close the incision as well as stromal hydration.  The Provisc was removed from the anterior chamber and capsular bag with irrigation and aspiration.  At this point, the wounds were tested for leak, which were negative.  The anterior chamber remained deep and stable.  The patient tolerated the procedure well.  There were no operative complications, and he awoke from general anesthesia without problem.  No surgical specimens.  Prosthetic device used is a Lenstec posterior chamber lens, model Softec HD, power of 18, serial number is [id number].          ______________________________ Susanne Greenhouse, MD     KEH/MEDQ  D:  10/24/2011  T:  10/24/2011  Job:  [id number]

## 2011-10-24 NOTE — Anesthesia Preprocedure Evaluation (Signed)
Anesthesia Evaluation  Patient identified by MRN, date of birth, ID band Patient awake    Reviewed: Allergy & Precautions, H&P , NPO status , Patient's Chart, lab work & pertinent test results  History of Anesthesia Complications Negative for: history of anesthetic complications  Airway Mallampati: III TM Distance: >3 FB Neck ROM: Full  Mouth opening: Limited Mouth Opening  Dental  (+) Edentulous Upper and Edentulous Lower   Pulmonary  breath sounds clear to auscultation        Cardiovascular hypertension, Pt. on medications Rhythm:Regular     Neuro/Psych PSYCHIATRIC DISORDERS Anxiety    GI/Hepatic GERD-  ,  Endo/Other    Renal/GU Renal disease (stones)     Musculoskeletal   Abdominal   Peds  Hematology   Anesthesia Other Findings   Reproductive/Obstetrics                           Anesthesia Physical Anesthesia Plan  ASA: III  Anesthesia Plan: MAC   Post-op Pain Management:    Induction: Intravenous  Airway Management Planned: Nasal Cannula  Additional Equipment:   Intra-op Plan:   Post-operative Plan:   Informed Consent: I have reviewed the patients History and Physical, chart, labs and discussed the procedure including the risks, benefits and alternatives for the proposed anesthesia with the patient or authorized representative who has indicated his/her understanding and acceptance.     Plan Discussed with:   Anesthesia Plan Comments:         Anesthesia Quick Evaluation

## 2011-10-24 NOTE — Transfer of Care (Signed)
Immediate Anesthesia Transfer of Care Note  Patient: Joe [name]  Procedure(s) Performed: Procedure(s) (LRB): CATARACT EXTRACTION PHACO AND INTRAOCULAR LENS PLACEMENT (IOC) (Left)  Patient Location: Shortstay  Anesthesia Type: MAC  Level of Consciousness: awake  Airway & Oxygen Therapy: Patient Spontanous Breathing   Post-op Assessment: Report given to PACU RN, Post -op Vital signs reviewed and stable and Patient moving all extremities  Post vital signs: Reviewed and stable  Complications: No apparent anesthesia complications

## 2011-10-24 NOTE — Brief Op Note (Signed)
Pre-Op Dx: Cataract OS Post-Op Dx: Cataract OS Surgeon: Tannisha Kennington Anesthesia: Topical with MAC Surgery: Cataract Extraction with Intraocular lens Implant OS Implant: Lenstec, Model Softec HD Specimen: None Complications: None 

## 2011-10-24 NOTE — Anesthesia Postprocedure Evaluation (Signed)
  Anesthesia Post-op Note  Patient: Joe [name]  Procedure(s) Performed: Procedure(s) (LRB): CATARACT EXTRACTION PHACO AND INTRAOCULAR LENS PLACEMENT (IOC) (Left)  Patient Location:  Short Stay  Anesthesia Type: MAC  Level of Consciousness: awake  Airway and Oxygen Therapy: Patient Spontanous Breathing  Post-op Pain: none  Post-op Assessment: Post-op Vital signs reviewed, Patient's Cardiovascular Status Stable, Respiratory Function Stable, Patent Airway, No signs of Nausea or vomiting and Pain level controlled  Post-op Vital Signs: Reviewed and stable  Complications: No apparent anesthesia complications

## 2011-10-27 ENCOUNTER — Encounter (HOSPITAL_COMMUNITY): Payer: Self-pay | Admitting: Ophthalmology

## 2011-12-29 ENCOUNTER — Ambulatory Visit (HOSPITAL_COMMUNITY)
Admission: RE | Admit: 2011-12-29 | Discharge: 2011-12-29 | Disposition: A | Discharge status: Home / Self Care | Payer: Medicare Other | Source: Ambulatory Visit | Attending: Physician Assistant | Admitting: Physician Assistant

## 2011-12-29 ENCOUNTER — Other Ambulatory Visit (HOSPITAL_COMMUNITY): Payer: Self-pay | Admitting: Physician Assistant

## 2011-12-29 DIAGNOSIS — M79609 Pain in unspecified limb: Secondary | ICD-10-CM | POA: Insufficient documentation

## 2011-12-29 DIAGNOSIS — M25849 Other specified joint disorders, unspecified hand: Secondary | ICD-10-CM

## 2013-11-28 ENCOUNTER — Encounter: Payer: Self-pay | Admitting: Neurology

## 2013-11-29 ENCOUNTER — Encounter: Payer: Self-pay | Admitting: Neurology

## 2013-11-29 ENCOUNTER — Ambulatory Visit (INDEPENDENT_AMBULATORY_CARE_PROVIDER_SITE_OTHER): Payer: Medicare Other | Admitting: Neurology

## 2013-11-29 VITALS — BP 135/76 | HR 100 | Ht 65.0 in | Wt 243.0 lb

## 2013-11-29 DIAGNOSIS — G20A1 Parkinson's disease without dyskinesia, without mention of fluctuations: Secondary | ICD-10-CM

## 2013-11-29 DIAGNOSIS — G20 Parkinson's disease: Secondary | ICD-10-CM

## 2013-11-29 MED ORDER — CARBIDOPA-LEVODOPA 25-100 MG PO TABS: 1.0000 | ORAL_TABLET | Freq: Three times a day (TID) | ORAL | Status: DC

## 2013-11-29 NOTE — Patient Instructions (Signed)
Overall you are doing fairly well but I do want to suggest a few things today:   Remember to drink plenty of fluid, eat healthy meals and do not skip any meals. Try to eat protein with a every meal and eat a healthy snack such as fruit or nuts in between meals. Try to keep a regular sleep-wake schedule and try to exercise daily, particularly in the form of walking, 20-30 minutes a day, if you can.   As far as your medications are concerned, I would like to suggest you increase the Sinemet to 25-100 one tablet three times a day. Please take at 8am, noon and 4pm.   Please continue to work with physical therapy at your rehab center.   Please follow up in 4 months with Dr. Star Age. Please call us with any interim questions, concerns, problems, updates or refill requests.   My clinical assistant and will answer any of your questions and relay your messages to me and also relay most of my messages to you.   Our phone number is [phone]. We also have an after hours call service for urgent matters and there is a physician on-call for urgent questions. For any emergencies you know to call 911 or go to the nearest emergency room

## 2013-11-29 NOTE — Progress Notes (Signed)
[facility]    Provider:  Dr Janann Colonel Referring Provider: Elsie Lincoln, MD Primary Care Physician:  Leonides Grills, MD  CC:  Gait instability  HPI:  Joe [name] is a 63 y.o. male here as a referral from Dr. Orson Ape for question of possible parkinsonism  Currently in rehab after having a fall, states he slipped on the floor. Unsure when symptoms started. His sister thinks his hands started shaking a few months ago. Hand shaking described as both a rest and postural/action tremor. Notes difficulty with fine motor tasks. Has more difficulty walking, it has slowed down, feels very unsteady on his feet. Patient and sister note that his walking started getting bad around 1 year ago. Has had multiple falls. No muscle aching or cramps. Has some difficulty with sleep, unsure if any REM behavior disorder. Sister notes his voice has gotten softer. They deny any difficulty with memory. Notes micrographia and messy handwriting. Notes normal sense of smell.   Has been on Abilify in the past for severe depression. He notes being on this recently, he thinks that the symptoms started after he took abilify but he is not sure.  He stopped it around 2-3 weeks ago. He was recently started on Sinemet (unsure of dose). States he is taking it twice a day. States he feels better with this medication.   No family history of parkinsonism.   Review of Systems: Out of a complete 14 system review, the patient complains of only the following symptoms, and all other reviewed systems are negative. + tremor, fatigue, snoring, anxiety, racing thoughts  History   Social History  . Marital Status: Divorced    Spouse Name: N/A    Number of Children: 2  . Years of Education: 12   Occupational History  .    [employer] Disabled     Social History Main Topics  . Smoking status: Never Smoker   . Smokeless tobacco: Never Used  . Alcohol Use: No  . Drug Use: No  . Sexual Activity: Not on file   Other  Topics Concern  . Not on file   Social History Narrative   Patient lives at a nursing home.    Patient is disabled.    Patient has a high school education.    Patient is divorced.    Patient has 2 children.    Patient is right handed.    Family History  Problem Relation Age of Onset  . Anesthesia problems Neg Hx   . Hypotension Neg Hx   . Malignant hyperthermia Neg Hx   . Pseudochol deficiency Neg Hx   . Heart disease    . Cancer    . Hypertension      Past Medical History  Diagnosis Date  . Anxiety   . Arthritis     osteoarthritis  . DJD (degenerative joint disease)   . Hypertension   . Obesity   . Borderline diabetes   . H/O renal calculi 1992  . Sleep apnea     STOP BANG; 6    Past Surgical History  Procedure Laterality Date  . Cystoscopy      with kidney stone removal-mmh  . Appendectomy  81     mmh  . Hernia repair      umbilical hernia repair-mmh  . Cataract extraction w/phaco  12/09/2010    Procedure: CATARACT EXTRACTION PHACO AND INTRAOCULAR LENS PLACEMENT (IOC);  Surgeon: Tonny Branch;  Location: [facility];  Service: Ophthalmology;  Laterality: Right;  CDE: 36.21  . Colonoscopy  09/20/2011    Procedure: COLONOSCOPY;  Surgeon: Jamesetta So, MD;  Location: [facility];  Service: Gastroenterology;  Laterality: N/A;  . Cataract extraction w/phaco  10/24/2011    Procedure: CATARACT EXTRACTION PHACO AND INTRAOCULAR LENS PLACEMENT (IOC);  Surgeon: Tonny Branch, MD;  Location: [facility];  Service: Ophthalmology;  Laterality: Left;  CDE 10.35    Current Outpatient Prescriptions  Medication Sig Dispense Refill  . ALPRAZolam (XANAX) 1 MG tablet Take 1 mg by mouth 3 (three) times daily as needed. For anxiety      . Carbidopa-Levodopa (SINEMET PO) Take by mouth.      Marland Kitchen FLUoxetine (PROZAC) 20 MG capsule Take 20 mg by mouth daily.      Marland Kitchen lisinopril (PRINIVIL,ZESTRIL) 5 MG tablet Take 5 mg by mouth daily.       No current facility-administered medications for this visit.     Allergies as of 11/29/2013  . (No Known Allergies)    Vitals: BP 135/76  Pulse 100  Ht 5\' 5"  (1.651 m)  Wt 243 lb (110.224 kg)  BMI 40.44 kg/m2 Last Weight:  Wt Readings from Last 1 Encounters:  11/29/13 243 lb (110.224 kg)   Last Height:   Ht Readings from Last 1 Encounters:  11/29/13 5\' 5"  (1.651 m)     Physical exam: Exam: Gen: NAD, conversant Eyes: anicteric sclerae, moist conjunctivae, decreased blink rate HENT: Atraumatic, oropharynx clear Neck: Trachea midline; supple,  Lungs: CTA, no wheezing, rales, rhonic                          CV: RRR, no MRG Abdomen: Soft, non-tender;  Extremities: No peripheral edema  Skin: Normal temperature, no rash,  Psych: Appropriate affect, pleasant  Neuro: MS: AA&Ox3, appropriately interactive, normal affect   Speech: fluent w/o paraphasic error  Memory: good recent and remote recall  CN: Mild masked facies,PERRL, EOMI no nystagmus, no ptosis, sensation intact to LT V1-V3 bilat, face symmetric, no weakness, hearing grossly intact, palate elevates symmetrically, shoulder shrug 5/5 bilat,  tongue protrudes midline, no fasiculations noted.  Motor: normal bulk and tone Strength: 5/5  In all extremities  Coord: mild rest tremor bilat R>L, more pronounced with distraction. Bilateral postural and intention tremor L>R. Mild to moderate bradykinesia with finger taps, hand opening, RAM and foot tapping. Bilateral cogwheel rigidity  Small irregular handwriting. Copies small irregular Archimedes spiral  Reflexes: symmetrical, bilat downgoing toes  Sens: LT intact in all extremities  Gait: stands slowly pushing off without assistance. Slightly stooped small steps, decreased arm swing bilaterally, Re-emergent hand tremor on the left   Assessment:  After physical and neurologic examination, review of laboratory studies, imaging, neurophysiology testing and pre-existing records, assessment will be reviewed on the problem  list.  Plan:  Treatment plan and additional workup will be reviewed under Problem List.  1)Parkinsonism  63y/o gentleman presenting for initial evaluation of tremor and gait instability. History and exam is most consistent with a diagnosis of parkinsonism though he does appear to have some features of essential tremor on exam. 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 He does feel that he has gotten a good benefit from L-dopa. Will plan to increase Sinemet to 25-100 TID at 8am, noon and 4pm. In the future can consider addition of dopamine agonist or Azilect. Follow up in 4 months with Dr Rexene Alberts.  Jim Like, DO  [facility] [address] [place], [place] [address]  Phone [phone] Fax [phone]

## 2014-03-26 ENCOUNTER — Ambulatory Visit: Payer: Medicare Other | Admitting: Neurology

## 2014-03-26 ENCOUNTER — Telehealth: Payer: Self-pay | Admitting: Neurology

## 2014-03-26 NOTE — Telephone Encounter (Signed)
Patient is a no show for today's appointment 03/26/14

## 2014-04-01 ENCOUNTER — Ambulatory Visit: Payer: Medicare Other | Admitting: Neurology

## 2014-04-01 ENCOUNTER — Encounter: Payer: Self-pay | Admitting: Neurology

## 2014-04-09 DIAGNOSIS — Z6839 Body mass index (BMI) 39.0-39.9, adult: Secondary | ICD-10-CM | POA: Diagnosis not present

## 2014-04-09 DIAGNOSIS — R05 Cough: Secondary | ICD-10-CM | POA: Diagnosis not present

## 2014-04-09 DIAGNOSIS — J189 Pneumonia, unspecified organism: Secondary | ICD-10-CM | POA: Diagnosis not present

## 2014-05-26 ENCOUNTER — Ambulatory Visit: Payer: Medicare Other | Admitting: Neurology

## 2014-06-06 ENCOUNTER — Ambulatory Visit (INDEPENDENT_AMBULATORY_CARE_PROVIDER_SITE_OTHER): Payer: 59 | Admitting: Neurology

## 2014-06-06 ENCOUNTER — Encounter: Payer: Self-pay | Admitting: Neurology

## 2014-06-06 VITALS — BP 135/82 | HR 82 | Temp 98.5°F | Resp 22 | Ht 65.0 in | Wt 275.0 lb

## 2014-06-06 DIAGNOSIS — E669 Obesity, unspecified: Secondary | ICD-10-CM | POA: Diagnosis not present

## 2014-06-06 DIAGNOSIS — G20 Parkinson's disease: Secondary | ICD-10-CM | POA: Diagnosis not present

## 2014-06-06 DIAGNOSIS — R351 Nocturia: Secondary | ICD-10-CM

## 2014-06-06 DIAGNOSIS — R0683 Snoring: Secondary | ICD-10-CM

## 2014-06-06 DIAGNOSIS — G479 Sleep disorder, unspecified: Secondary | ICD-10-CM | POA: Diagnosis not present

## 2014-06-06 NOTE — Patient Instructions (Signed)
Based on your symptoms and your exam I believe you are at risk for obstructive sleep apnea or OSA, and I think we should proceed with a sleep study to determine whether you do or do not have OSA and how severe it is. If you have more than mild OSA, I want you to consider treatment with CPAP. Please remember, the risks and ramifications of moderate to severe obstructive sleep apnea or OSA are: Cardiovascular disease, including congestive heart failure, stroke, difficult to control hypertension, arrhythmias, and even type 2 diabetes has been linked to untreated OSA. Sleep apnea causes disruption of sleep and sleep deprivation in most cases, which, in turn, can cause recurrent headaches, problems with memory, mood, concentration, focus, and vigilance. Most people with untreated sleep apnea report excessive daytime sleepiness, which can affect their ability to drive. Please do not drive if you feel sleepy.  I will see you back after your sleep study to go over the test results and where to go from there. We will call you after your sleep study and to set up an appointment at the time.   We will keep your medication the same.   I will see you back in 6 months.

## 2014-06-06 NOTE — Progress Notes (Signed)
Subjective:    Patient ID: Joe [name] is a 64 y.o. male.  HPI     Interim history:   Joe [name] is a 64 year old right-handed gentleman with an underlying medical history of degenerative joint disease, hypertension, obesity, anxiety, arthritis, borderline diabetes and renal calculi, who presents for follow-up consultation of his gait disorder and parkinsonism. The patient is unaccompanied today and came via transportation service from [facility], where he resides in assisted living. This is his first visit with me and he previously had seen Dr. Jim Like and was evaluated by him on 11/29/2013, at which time Dr. 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 The patient was on Sinemet treatment and had benefited from it and Dr. Janann Colonel continued his Sinemet 25-100 milligrams strength one pill 3 times a day.   Today, the patient reports feeling stable. He participates in exercise at his assisted living facility. He has not fallen recently. He tries to drink enough water. He has no lower extremity swelling and denies hallucinations. He is significantly overweight and has gained weight. In the past he was able to lose weight and was down to 170 pounds he says. He is reported to snore. His ex-wife had told him that he snores. He has trouble maintaining sleep at night and takes Xanax 1 mg 3 times a day as well as Ambien at night. His other medications include fluoxetine 20 mg each morning, lisinopril 2.5 mg each morning, meloxicam 15 mg daily, vitamin D, Ultracet twice daily, carbidopa-levodopa 25-100 milligrams strength one tablet 3 times a day, at 8, 12, and 4 PM. Symptoms go back to last year when he started falling. He was admitted to [facility]. He was diagnosed with parkinsonism during the hospital stay as I understand. He's not able to give a concise history. He had noted tremor in his hands. He felt  Sinemet was helpful. He has no side effects from it. He denies lightheadedness. One time in the past he fell out of bed because he had a vivid dream. He has 2 daughters, one is an Therapist, sports at [employer] and the other is an Therapist, sports in [place].  His Past Medical History Is Significant For: Past Medical History  Diagnosis Date  . Anxiety   . Arthritis     osteoarthritis  . DJD (degenerative joint disease)   . Hypertension   . Obesity   . Borderline diabetes   . H/O renal calculi 1992  . Sleep apnea     STOP BANG; 7    Her Past Surgical History Is Significant For: Past Surgical History  Procedure Laterality Date  . Cystoscopy      with kidney stone removal-mmh  . Appendectomy  82     mmh  . Hernia repair      umbilical hernia repair-mmh  . Cataract extraction w/phaco  12/09/2010    Procedure: CATARACT EXTRACTION PHACO AND INTRAOCULAR LENS PLACEMENT (IOC);  Surgeon: Tonny Branch;  Location: [facility];  Service: Ophthalmology;  Laterality: Right;  CDE: 36.21  . Colonoscopy  09/20/2011    Procedure: COLONOSCOPY;  Surgeon: Jamesetta So, MD;  Location: [facility];  Service: Gastroenterology;  Laterality: N/A;  . Cataract extraction w/phaco  10/24/2011    Procedure: CATARACT EXTRACTION PHACO AND INTRAOCULAR LENS PLACEMENT (IOC);  Surgeon: Tonny Branch, MD;  Location: [facility];  Service: Ophthalmology;  Laterality: Left;  CDE 10.35    His Family  History Is Significant For: Family History  Problem Relation Age of Onset  . Anesthesia problems Neg Hx   . Hypotension Neg Hx   . Malignant hyperthermia Neg Hx   . Pseudochol deficiency Neg Hx   . Heart disease    . Cancer    . Hypertension      His Social History Is Significant For: History   Social History  . Marital Status: Divorced    Spouse Name: N/A  . Number of Children: 2  . Years of Education: 12   Occupational History  .    [employer] Disabled     Social History Main Topics  . Smoking status: Never Smoker   . Smokeless tobacco: Never  Used  . Alcohol Use: No  . Drug Use: No  . Sexual Activity: Not on file   Other Topics Concern  . None   Social History Narrative   Patient lives at [facility].     Patient is disabled.    Patient has a high school education.    Patient is divorced.    Patient has 2 children.    Patient is right handed.    His Allergies Are:  No Known Allergies:   His Current Medications Are:  Outpatient Encounter Prescriptions as of 06/06/2014  Medication Sig  . ALPRAZolam (XANAX) 1 MG tablet Take 1 mg by mouth 3 (three) times daily as needed. For anxiety  . carbidopa-levodopa (SINEMET IR) 25-100 MG per tablet Take 1 tablet by mouth 3 (three) times daily.  . cholecalciferol (VITAMIN D) 400 UNITS TABS tablet Take 400 Units by mouth.  Marland Kitchen FLUoxetine (PROZAC) 20 MG capsule Take 20 mg by mouth daily.  Marland Kitchen lisinopril (PRINIVIL,ZESTRIL) 5 MG tablet Take 5 mg by mouth daily.  . meloxicam (MOBIC) 15 MG tablet Take 15 mg by mouth daily.  . traMADol-acetaminophen (ULTRACET) 37.5-325 MG per tablet Take 1 tablet by mouth 2 (two) times daily.  Marland Kitchen zolpidem (AMBIEN) 10 MG tablet Take 10 mg by mouth at bedtime as needed for sleep.  :  Review of Systems:  Out of a complete 14 point review of systems, all are reviewed and negative with the exception of these symptoms as listed below:    Review of Systems  All other systems reviewed and are negative.   Objective:  Neurologic Exam  Physical Exam Physical Examination:   Filed Vitals:   06/06/14 1129  BP: 135/82  Pulse: 82  Temp: 98.5 F (36.9 C)  Resp: 22    General Examination: The patient is a very pleasant 64 y.o. male in no acute distress.  HEENT: Normocephalic, atraumatic, pupils are equal, round and reactive to light and accommodation. Funduscopic exam is normal with sharp disc margins noted. Extraocular tracking shows mild saccadic breakdown without nystagmus noted. There is no limitation to his gaze. There is minimal decrease in eye blink  rate. Hearing is intact. Face is symmetric with minimal facial masking and normal facial sensation. There is no lip, neck or jaw tremor. Neck is very mildly rigid with intact passive ROM. There are no carotid bruits on auscultation. Oropharynx exam reveals mild mouth dryness, moderate airway crowding secondary to narrow airway entry and thicker soft palate. Tonsils are 1+. Neck size is enlarged at 22. He has no oropharyngeal dyskinesias and no sialorrhea.  Chest: is clear to auscultation without wheezing, rhonchi or crackles noted.  Heart: sounds are regular and normal without murmurs, rubs or gallops noted.   Abdomen: is soft, non-tender and non-distended  with normal bowel sounds appreciated on auscultation.  Extremities: There is no pitting edema in the distal lower extremities bilaterally. Pedal pulses are intact.   Skin: is warm and dry with no trophic changes noted. Age-related changes are noted on the skin.   Musculoskeletal: exam reveals no obvious joint deformities, tenderness, joint swelling or erythema.  Neurologically:  Mental status: The patient is awake and alert, paying good  attention. He is able to provide the history, but not well. He is oriented to: person, place, time/date, situation, day of week, month of year and year. His memory, attention, language and knowledge are fair. There is no aphasia, agnosia, apraxia or anomia. There is a mild degree of bradyphrenia. Speech is mildly hypophonic with no dysarthria noted. Mood is congruent and affect is constricted.   Cranial nerves are as described above under HEENT exam. In addition, shoulder shrug is normal with equal shoulder height noted.  Motor exam: Normal bulk, and strength for age is noted. There are no dyskinesias noted.  Tone is mildly rigid with absence of cogwheeling. There is overall mild bradykinesia. There is no drift or rebound.  There is there is an intermittent right upper extremity resting tremor. He has a  postural and action tremor in both upper extremities, right more than left. Fine motor skills are mildly impaired in the right upper extremity and minimally impaired in the left upper extremity. Foot agility and foot taps are mild to moderately impaired bilaterally. He stands up with mild difficulty and has to push himself up. Tandem walk is not tested. Romberg is negative except for mild sway. He walks with decreased arm swing on the right. He turns in 3 steps. Balance seems otherwise mildly impaired. He does not have a walking aid. Sensory exam is intact to light touch, pinprick, vibration, temperature sense and proprioception in the upper and lower extremities. Reflexes are 2+ in the upper extremities and trace in the lower extremities. Toes are downgoing. Finger to nose testing is unremarkable and heel to shin is difficult bilaterally.  Assessment and Plan:   In summary, Joe [name] is a very pleasant 64 y.o.-year old male with an underlying medical history of degenerative joint disease, hypertension, obesity, anxiety, arthritis, borderline diabetes and renal calculi, who presents for follow-up consultation of his gait disorder and parkinsonism. On examination he has mild parkinsonism, he has been responsive to Sinemet. 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 It is difficult to tease out at this moment. Nevertheless, he has had improvement with Sinemet and his gait and parkinsonism both have improved from last year with when he was hospitalized for recurrent falls as I understand. He has a history and physical exam concerning for underlying obstructive sleep apnea. He has a rather large neck size and a tight appearing airway. He is told that he snores and he has trouble maintaining sleep. I would like to proceed with sleep study. He is agreeable. I talked to him about OSA and treatment options including using CPAP. He would be willing to try it if the need arises. To that end, we will  schedule him for sleep study. I would like for him to continue with Sinemet at the current dose. I am somewhat worried about his dose of Xanax which is 1 mg 3 times a day and in addition he also takes a minute at night. These may be chronic medications. He indicates that he has been on Xanax for years. He has a long-standing history of anxiety and  depression. At this juncture, I did not suggest any new medications and I will see him routinely in 6 months, sooner if need be. We will be in touch after he has had his sleep study as well.

## 2014-06-26 ENCOUNTER — Telehealth: Payer: Self-pay | Admitting: *Deleted

## 2014-06-26 NOTE — Telephone Encounter (Signed)
I will print sleep study order and sign and request [facility] in [place], can you fax there? And request report to be faxed to me?

## 2014-06-26 NOTE — Telephone Encounter (Signed)
This patient's sister called our office requesting that this patient's sleep study needs to be canceled for the 31st.  He is in an assisted living facility at [facility].  She states he has no transportation to and from study.  Gilmore Laroche please remove him from schedule.  Dr. Rexene Alberts you have already had a consult with the patient.  They have requested his sleep study to be performed at [facility] in [place].  Please advise.

## 2014-06-30 ENCOUNTER — Other Ambulatory Visit (HOSPITAL_COMMUNITY): Payer: Self-pay | Admitting: Radiology

## 2014-06-30 DIAGNOSIS — G4730 Sleep apnea, unspecified: Secondary | ICD-10-CM

## 2014-07-16 ENCOUNTER — Ambulatory Visit: Payer: Medicare Other | Attending: Neurology | Admitting: Sleep Medicine

## 2014-07-16 DIAGNOSIS — G4730 Sleep apnea, unspecified: Secondary | ICD-10-CM | POA: Insufficient documentation

## 2014-10-15 ENCOUNTER — Telehealth: Payer: Self-pay | Admitting: Neurology

## 2014-10-15 NOTE — Telephone Encounter (Signed)
I called number below, I was put on hold and then cut off. I tried calling back but call went to vm. Patient's sleep study with [place] was cancelled and rescheduled at [facility] in [place]. We have never received records from that, patient or family will have to request those records to be sent to [place] if they want Dr. Rexene Alberts to be ordering provider.

## 2014-10-15 NOTE — Telephone Encounter (Signed)
Joe [name] with [facility] is calling stating patient is states he should have CPAP machine per sleep study in April 2016. Please call and advise. When calling Nanine Means may ask for Macky Lower or Santiago Glad at [phone].

## 2014-10-24 NOTE — Telephone Encounter (Signed)
Left message at [facility] about patient. Left on vm the information below. I advised them if he wants CPAP treatment, [facility] will have to fax the sleep study to [place]. I left my call back number and fax number.

## 2014-12-10 ENCOUNTER — Ambulatory Visit: Payer: 59 | Admitting: Neurology

## 2014-12-25 ENCOUNTER — Ambulatory Visit (INDEPENDENT_AMBULATORY_CARE_PROVIDER_SITE_OTHER): Payer: Medicare Other | Admitting: Neurology

## 2014-12-25 ENCOUNTER — Encounter: Payer: Self-pay | Admitting: Neurology

## 2014-12-25 VITALS — BP 132/78 | HR 70 | Resp 18 | Ht 66.0 in | Wt 270.0 lb

## 2014-12-25 DIAGNOSIS — G20 Parkinson's disease: Secondary | ICD-10-CM | POA: Diagnosis not present

## 2014-12-25 DIAGNOSIS — E669 Obesity, unspecified: Secondary | ICD-10-CM

## 2014-12-25 DIAGNOSIS — F39 Unspecified mood [affective] disorder: Secondary | ICD-10-CM

## 2014-12-25 NOTE — Progress Notes (Signed)
Subjective:    Patient ID: Joe [name] is a 64 y.o. male.  HPI     Interim history:   Joe [name] is a 64 year old right-handed gentleman with an underlying medical history of degenerative joint disease, hypertension, obesity, anxiety, arthritis, borderline diabetes and renal calculi, who presents for follow-up consultation of Joe gait disorder and parkinsonism. The patient is unaccompanied today and was dropped off by a friend, he states, as they do not provide transportation any longer. He resides at [facility], in assisted living. I first met him on 06/06/2014, at which time he reported feeling stable. He was participating in exercise at the assisted living facility. He had no recent falls. He was trying to drink enough water. He denied any hallucinations or lower extremity swelling. He had gained weight. He reported snoring. I suggested we proceed with a sleep study. He canceled the sleep study in the interim. He was taking: Xanax 1 mg 3 times a day as well as Ambien at night. Joe other medications include fluoxetine 20 mg each morning, lisinopril 2.5 mg each morning, meloxicam 15 mg daily, vitamin D, Ultracet twice daily, carbidopa-levodopa 25-100 milligrams strength one tablet 3 times a day, at 8, 12, and 4 PM. I did not change any medications. I did worry about Joe Xanax dose. It was not clear how long he had been on it. He indicated taking it for years.  Today, 12/25/2014: He reports doing ok. He has not fallen, he likes to read, fiction and non-fiction. He goes to exercise class about 5 times a week. He tries to drink enough water. He has a remote Hx of kidney stones. He had a sleep study at [facility] he states. He was told he needs CPAP, but has not received it. We will request study results.    Previously:  06/06/2014: This is Joe first visit with me and he previously had seen Dr. Jim Like and was evaluated by him on 11/29/2013, at which time Dr. 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 The patient was on Sinemet treatment and had benefited from it and Dr. Janann Colonel continued Joe Sinemet 25-100 milligrams strength one pill 3 times a day.    Symptoms go back to last year when he started falling. He was admitted to [facility]. He was diagnosed with parkinsonism during the hospital stay as I understand. He's not able to give a concise history. He had noted tremor in Joe hands. He felt Sinemet was helpful. He has no side effects from it. He denies lightheadedness. One time in the past he fell out of bed because he had a vivid dream. He has 2 daughters, one is an Therapist, sports at [employer] and the other is an Therapist, sports in [place].  Joe Past Medical History Is Significant For: Past Medical History  Diagnosis Date  . Anxiety   . Arthritis     osteoarthritis  . DJD (degenerative joint disease)   . Hypertension   . Obesity   . Borderline diabetes   . H/O renal calculi 1992  . Sleep apnea     STOP BANG; 7    Joe Past Surgical History Is Significant For: Past Surgical History  Procedure Laterality Date  . Cystoscopy      with kidney stone removal-mmh  . Appendectomy  81     mmh  . Hernia repair      umbilical hernia repair-mmh  .  Cataract extraction w/phaco  12/09/2010    Procedure: CATARACT EXTRACTION PHACO AND INTRAOCULAR LENS PLACEMENT (IOC);  Surgeon: Tonny Branch;  Location: [facility];  Service: Ophthalmology;  Laterality: Right;  CDE: 36.21  . Colonoscopy  09/20/2011    Procedure: COLONOSCOPY;  Surgeon: Jamesetta So, MD;  Location: [facility];  Service: Gastroenterology;  Laterality: N/A;  . Cataract extraction w/phaco  10/24/2011    Procedure: CATARACT EXTRACTION PHACO AND INTRAOCULAR LENS PLACEMENT (IOC);  Surgeon: Tonny Branch, MD;  Location: [facility];  Service: Ophthalmology;  Laterality: Left;  CDE 10.35    Joe Family History Is Significant For: Family History  Problem  Relation Age of Onset  . Anesthesia problems Neg Hx   . Hypotension Neg Hx   . Malignant hyperthermia Neg Hx   . Pseudochol deficiency Neg Hx   . Heart disease    . Cancer    . Hypertension      Joe Social History Is Significant For: Social History   Social History  . Marital Status: Divorced    Spouse Name: N/A  . Number of Children: 2  . Years of Education: 12   Occupational History  .    [employer] Disabled     Social History Main Topics  . Smoking status: Never Smoker   . Smokeless tobacco: Never Used  . Alcohol Use: No  . Drug Use: No  . Sexual Activity: Not Asked   Other Topics Concern  . None   Social History Narrative   Patient lives at [facility].     Patient is disabled.    Patient has a high school education.    Patient is divorced.    Patient has 2 children.    Patient is right handed.    Joe Allergies Are:  No Known Allergies:   Joe Current Medications Are:  Outpatient Encounter Prescriptions as of 12/25/2014  Medication Sig  . ALPRAZolam (XANAX) 1 MG tablet Take 1 mg by mouth 3 (three) times daily as needed. For anxiety  . carbidopa-levodopa (SINEMET IR) 25-100 MG per tablet Take 1 tablet by mouth 3 (three) times daily.  . cholecalciferol (VITAMIN D) 400 UNITS TABS tablet Take 400 Units by mouth.  Marland Kitchen FLUoxetine (PROZAC) 20 MG capsule Take 20 mg by mouth daily.  Marland Kitchen lisinopril (PRINIVIL,ZESTRIL) 2.5 MG tablet   . lisinopril (PRINIVIL,ZESTRIL) 5 MG tablet Take 5 mg by mouth daily.  . meloxicam (MOBIC) 15 MG tablet Take 15 mg by mouth daily.  [place] (OYSTER CALCIUM) 500 MG TABS tablet Take 500 mg of elemental calcium by mouth 2 (two) times daily.  . traMADol-acetaminophen (ULTRACET) 37.5-325 MG per tablet Take 1 tablet by mouth 2 (two) times daily.  . traZODone (DESYREL) 50 MG tablet   . [DISCONTINUED] zolpidem (AMBIEN) 10 MG tablet Take 10 mg by mouth at bedtime as needed for sleep.   No facility-administered encounter medications on file as of  12/25/2014.  :  Review of Systems:  Out of a complete 14 point review of systems, all are reviewed and negative with the exception of these symptoms as listed below:   Review of Systems  Neurological:       Patient states that he only sleeps a couple of hours a night. He reports having sleep study done at [facility] and we will get those records sent to [place].     Objective:  Neurologic Exam  Physical Exam Physical Examination:   Filed Vitals:   12/25/14 1405  BP:  132/78  Pulse: 70  Resp: 18    General Examination: The patient is a very pleasant 64 y.o. male in no acute distress.  HEENT: Normocephalic, atraumatic, pupils are equal, round and reactive to light and accommodation. Funduscopic exam is normal with sharp disc margins noted. Extraocular tracking shows mild saccadic breakdown without nystagmus noted. There is no limitation to Joe gaze. There is minimal decrease in eye blink rate. Hearing is intact. Face is symmetric with minimal facial masking and normal facial sensation. There is no lip, neck or jaw tremor. Neck is very mildly rigid with intact passive ROM. There are no carotid bruits on auscultation. Oropharynx exam reveals mild mouth dryness, moderate airway crowding secondary to narrow airway entry and thicker soft palate. Tonsils are 1+. He has no oropharyngeal dyskinesias and no sialorrhea.  Chest: is clear to auscultation without wheezing, rhonchi or crackles noted.  Heart: sounds are regular and normal without murmurs, rubs or gallops noted.   Abdomen: is soft, non-tender and non-distended with normal bowel sounds appreciated on auscultation.  Extremities: There is trace edema in the distal lower extremities, around the ankles bilaterally. Pedal pulses are intact.   Skin: is warm and dry with no trophic changes noted. Age-related changes are noted on the skin.   Musculoskeletal: exam reveals no obvious joint deformities, tenderness, joint swelling or  erythema.  Neurologically:  Mental status: The patient is awake and alert, paying good  attention. He is able to provide the history, but not well. He is oriented to: person, place, time/date, situation, day of week, month of year and year. Joe memory, attention, language and knowledge are fair. There is no aphasia, agnosia, apraxia or anomia. There is a mild degree of bradyphrenia. Speech is mildly hypophonic with no dysarthria noted. Mood is congruent and affect is constricted.   Cranial nerves are as described above under HEENT exam. In addition, shoulder shrug is normal with equal shoulder height noted.  Motor exam: Normal bulk, and strength for age is noted. There are no dyskinesias noted.  Tone is mildly rigid with absence of cogwheeling. There is overall mild bradykinesia. There is no drift or rebound.  There is there is an intermittent right upper extremity resting tremor. He has a postural and action tremor in both upper extremities, equal. Fine motor skills are mildly impaired in the right upper extremity and minimally impaired in the left upper extremity. Foot agility and foot taps are mild to moderately impaired bilaterally. He stands up with mild difficulty and has to push himself up. Tandem walk is not tested. Romberg is negative except for mild sway. He walks with decreased arm swing on the right. He turns in 3 steps. Balance seems otherwise mildly impaired. He does not have a walking aid. Sensory exam is intact to light touch, vibration, temperature sense in the upper and lower extremities. Reflexes are 2+ in the upper extremities and trace in the lower extremities. Toes are downgoing. Finger to nose testing is unremarkable and heel to shin is difficult bilaterally.  Assessment and Plan:   In summary, HERSHEY KNAUER is a very pleasant 63 year old male with an underlying medical history of degenerative joint disease, hypertension, obesity, anxiety, arthritis, borderline diabetes and renal  calculi, who presents for follow-up consultation of Joe gait disorder and parkinsonism. On examination he has very mild parkinsonism and he has been responsive to Sinemet. It may be difficult to tease out if he has medication induced at this moment. Nevertheless, he has had improvement  with Sinemet and Joe gait and parkinsonism both have improved from last year, when he was hospitalized for recurrent falls as I understand. He had a sleep study at [facility]. We will try to get records. He was told he needs a CPAP machine but has not received 1. Joe [name] had called in March and canceled Joe sleep study here. I suggested that he continue with Sinemet at the current dose. I will look out for Joe sleep study results. I suggested a six-month follow-up. I answered all Joe questions today and he was in agreement. We will be in touch after I reviewed Joe sleep study results. I spent 20 minutes in total face-to-face time with the patient, more than 50% of which was spent in counseling and coordination of care, reviewing test results, reviewing medication and discussing or reviewing the diagnosis of parkinsonism, its prognosis and treatment options.

## 2014-12-25 NOTE — Patient Instructions (Signed)
You can continue with Sinemet 25/100 mg 1 pill 3 times a day.  We will try to get your sleep study results and I will review it.

## 2014-12-26 ENCOUNTER — Other Ambulatory Visit (HOSPITAL_COMMUNITY): Payer: Self-pay | Admitting: Respiratory Therapy

## 2014-12-31 ENCOUNTER — Telehealth: Payer: Self-pay

## 2014-12-31 ENCOUNTER — Other Ambulatory Visit (HOSPITAL_COMMUNITY): Payer: Self-pay | Admitting: Respiratory Therapy

## 2014-12-31 NOTE — Telephone Encounter (Signed)
Patient had sleep study done on 07/16/2014 at [place] [phone]). We finally received report via fax. The report appears to not have been read or interpreted by a physician. And on the report it says that Dr. Brett Fairy is the interpreting physician, which she does not read sleep studies there. I left a message for the manager to call me back. We need the report read ASAP and the reading doctor to be corrected.

## 2014-12-31 NOTE — Telephone Encounter (Signed)
I spoke to [place] again at [facility] to check on status. She states that the sleep manager is out of the office today. She has sent this information to the sleep manager via email. I will call back again tomorrow.

## 2015-01-01 NOTE — Telephone Encounter (Signed)
Called [place] again. No answer and was unable to leave a message

## 2015-01-07 ENCOUNTER — Telehealth: Payer: Self-pay

## 2015-01-07 NOTE — Telephone Encounter (Signed)
Opened in error

## 2015-01-07 NOTE — Telephone Encounter (Signed)
I called back to the sleep lab at [facility]. I spoke again to [place] in the office. I reminded her of my phone call last week and reported that the manager has not called me back yet. Sharyn Lull checked to see if the manager [facility]) was in the office and she was not. Sharyn Lull offered to let me leave a vm.   I left vm for Patty stating that she needs to call me or Shirlean Mylar Orthoptist of sleep lab) back ASAP. I stated that we had referred this patient for sleep study back in April. The report that was faxed to [place] has not been read by a physician. I asked that (1) to make sure the right report was faxed to [place]. If a physician has read it, we need that faxed to [place] ASAP. (2) If the sleep study has not been read by a physician, this needs to be done ASAP. This should have been done in April. (3) The report that was sent to [place] had Dr. Asencion Partridge Dohmeier as the interpreting physician which she it not. And this needs to be corrected.   I asked that she call me or Robin back as soon as possible.

## 2015-01-08 NOTE — Telephone Encounter (Signed)
Spoke with the sleep Management consultant at [facility]. Tech faxed report under the assumption Dr. Brett Fairy read sleep studies from other facilites. She sent Dr. Merlene Laughter a message to read study ASAP. She will fax this report to me at my attention. I will get this to Dr. Rexene Alberts. She sent her apology for the delay.

## 2015-03-24 ENCOUNTER — Telehealth: Payer: Self-pay

## 2015-03-24 NOTE — Telephone Encounter (Signed)
Noni Saupe RN from [facility] called to follow up for this patient. She states that she is new to the facility and is trying to get the patient taken care of. He has not received his CPAP yet. I advised her that his sister cancelled sleep study with [place] and had it done at [facility]. Sleep study was never read by Dr. Merlene Laughter and we have been in contact with the sleep center manager about getting this completed. Stacy advised [place] that she will help the patient f/u with Dr. Merlene Laughter about getting on CPAP.

## 2015-05-08 DIAGNOSIS — R7309 Other abnormal glucose: Secondary | ICD-10-CM | POA: Diagnosis not present

## 2015-05-08 DIAGNOSIS — M25561 Pain in right knee: Secondary | ICD-10-CM | POA: Diagnosis not present

## 2015-05-08 DIAGNOSIS — Z1389 Encounter for screening for other disorder: Secondary | ICD-10-CM | POA: Diagnosis not present

## 2015-05-08 DIAGNOSIS — Z6841 Body Mass Index (BMI) 40.0 and over, adult: Secondary | ICD-10-CM | POA: Diagnosis not present

## 2015-05-08 DIAGNOSIS — M7041 Prepatellar bursitis, right knee: Secondary | ICD-10-CM | POA: Diagnosis not present

## 2015-05-13 DIAGNOSIS — F419 Anxiety disorder, unspecified: Secondary | ICD-10-CM | POA: Diagnosis not present

## 2015-05-13 DIAGNOSIS — M25561 Pain in right knee: Secondary | ICD-10-CM | POA: Diagnosis not present

## 2015-05-13 DIAGNOSIS — I10 Essential (primary) hypertension: Secondary | ICD-10-CM | POA: Diagnosis not present

## 2015-05-13 DIAGNOSIS — G20 Parkinson's disease: Secondary | ICD-10-CM | POA: Diagnosis not present

## 2015-05-13 DIAGNOSIS — M6281 Muscle weakness (generalized): Secondary | ICD-10-CM | POA: Diagnosis not present

## 2015-05-13 DIAGNOSIS — F329 Major depressive disorder, single episode, unspecified: Secondary | ICD-10-CM | POA: Diagnosis not present

## 2015-05-13 DIAGNOSIS — Z9181 History of falling: Secondary | ICD-10-CM | POA: Diagnosis not present

## 2015-05-13 DIAGNOSIS — M5136 Other intervertebral disc degeneration, lumbar region: Secondary | ICD-10-CM | POA: Diagnosis not present

## 2015-05-13 DIAGNOSIS — R2689 Other abnormalities of gait and mobility: Secondary | ICD-10-CM | POA: Diagnosis not present

## 2015-05-18 DIAGNOSIS — R2689 Other abnormalities of gait and mobility: Secondary | ICD-10-CM | POA: Diagnosis not present

## 2015-05-18 DIAGNOSIS — G20 Parkinson's disease: Secondary | ICD-10-CM | POA: Diagnosis not present

## 2015-05-18 DIAGNOSIS — M5136 Other intervertebral disc degeneration, lumbar region: Secondary | ICD-10-CM | POA: Diagnosis not present

## 2015-05-18 DIAGNOSIS — M25561 Pain in right knee: Secondary | ICD-10-CM | POA: Diagnosis not present

## 2015-05-18 DIAGNOSIS — F329 Major depressive disorder, single episode, unspecified: Secondary | ICD-10-CM | POA: Diagnosis not present

## 2015-05-18 DIAGNOSIS — M6281 Muscle weakness (generalized): Secondary | ICD-10-CM | POA: Diagnosis not present

## 2015-05-20 DIAGNOSIS — M5136 Other intervertebral disc degeneration, lumbar region: Secondary | ICD-10-CM | POA: Diagnosis not present

## 2015-05-20 DIAGNOSIS — M25561 Pain in right knee: Secondary | ICD-10-CM | POA: Diagnosis not present

## 2015-05-20 DIAGNOSIS — G20 Parkinson's disease: Secondary | ICD-10-CM | POA: Diagnosis not present

## 2015-05-20 DIAGNOSIS — F329 Major depressive disorder, single episode, unspecified: Secondary | ICD-10-CM | POA: Diagnosis not present

## 2015-05-20 DIAGNOSIS — M6281 Muscle weakness (generalized): Secondary | ICD-10-CM | POA: Diagnosis not present

## 2015-05-20 DIAGNOSIS — R2689 Other abnormalities of gait and mobility: Secondary | ICD-10-CM | POA: Diagnosis not present

## 2015-05-26 DIAGNOSIS — M5136 Other intervertebral disc degeneration, lumbar region: Secondary | ICD-10-CM | POA: Diagnosis not present

## 2015-05-26 DIAGNOSIS — M6281 Muscle weakness (generalized): Secondary | ICD-10-CM | POA: Diagnosis not present

## 2015-05-26 DIAGNOSIS — R2689 Other abnormalities of gait and mobility: Secondary | ICD-10-CM | POA: Diagnosis not present

## 2015-05-26 DIAGNOSIS — G20 Parkinson's disease: Secondary | ICD-10-CM | POA: Diagnosis not present

## 2015-05-26 DIAGNOSIS — M25561 Pain in right knee: Secondary | ICD-10-CM | POA: Diagnosis not present

## 2015-05-26 DIAGNOSIS — F329 Major depressive disorder, single episode, unspecified: Secondary | ICD-10-CM | POA: Diagnosis not present

## 2015-05-28 DIAGNOSIS — M6281 Muscle weakness (generalized): Secondary | ICD-10-CM | POA: Diagnosis not present

## 2015-05-28 DIAGNOSIS — R2689 Other abnormalities of gait and mobility: Secondary | ICD-10-CM | POA: Diagnosis not present

## 2015-05-28 DIAGNOSIS — M5136 Other intervertebral disc degeneration, lumbar region: Secondary | ICD-10-CM | POA: Diagnosis not present

## 2015-05-28 DIAGNOSIS — M25561 Pain in right knee: Secondary | ICD-10-CM | POA: Diagnosis not present

## 2015-05-28 DIAGNOSIS — F329 Major depressive disorder, single episode, unspecified: Secondary | ICD-10-CM | POA: Diagnosis not present

## 2015-05-28 DIAGNOSIS — G20 Parkinson's disease: Secondary | ICD-10-CM | POA: Diagnosis not present

## 2015-06-01 DIAGNOSIS — M6281 Muscle weakness (generalized): Secondary | ICD-10-CM | POA: Diagnosis not present

## 2015-06-01 DIAGNOSIS — H26492 Other secondary cataract, left eye: Secondary | ICD-10-CM | POA: Diagnosis not present

## 2015-06-01 DIAGNOSIS — G20 Parkinson's disease: Secondary | ICD-10-CM | POA: Diagnosis not present

## 2015-06-01 DIAGNOSIS — R2689 Other abnormalities of gait and mobility: Secondary | ICD-10-CM | POA: Diagnosis not present

## 2015-06-01 DIAGNOSIS — M25561 Pain in right knee: Secondary | ICD-10-CM | POA: Diagnosis not present

## 2015-06-01 DIAGNOSIS — F329 Major depressive disorder, single episode, unspecified: Secondary | ICD-10-CM | POA: Diagnosis not present

## 2015-06-01 DIAGNOSIS — M5136 Other intervertebral disc degeneration, lumbar region: Secondary | ICD-10-CM | POA: Diagnosis not present

## 2015-06-03 DIAGNOSIS — B351 Tinea unguium: Secondary | ICD-10-CM | POA: Diagnosis not present

## 2015-06-03 DIAGNOSIS — M79675 Pain in left toe(s): Secondary | ICD-10-CM | POA: Diagnosis not present

## 2015-06-03 DIAGNOSIS — M79674 Pain in right toe(s): Secondary | ICD-10-CM | POA: Diagnosis not present

## 2015-06-04 DIAGNOSIS — G20 Parkinson's disease: Secondary | ICD-10-CM | POA: Diagnosis not present

## 2015-06-04 DIAGNOSIS — M5136 Other intervertebral disc degeneration, lumbar region: Secondary | ICD-10-CM | POA: Diagnosis not present

## 2015-06-04 DIAGNOSIS — M25561 Pain in right knee: Secondary | ICD-10-CM | POA: Diagnosis not present

## 2015-06-04 DIAGNOSIS — F329 Major depressive disorder, single episode, unspecified: Secondary | ICD-10-CM | POA: Diagnosis not present

## 2015-06-04 DIAGNOSIS — M6281 Muscle weakness (generalized): Secondary | ICD-10-CM | POA: Diagnosis not present

## 2015-06-04 DIAGNOSIS — R2689 Other abnormalities of gait and mobility: Secondary | ICD-10-CM | POA: Diagnosis not present

## 2015-06-09 DIAGNOSIS — R2689 Other abnormalities of gait and mobility: Secondary | ICD-10-CM | POA: Diagnosis not present

## 2015-06-09 DIAGNOSIS — M5136 Other intervertebral disc degeneration, lumbar region: Secondary | ICD-10-CM | POA: Diagnosis not present

## 2015-06-09 DIAGNOSIS — M25561 Pain in right knee: Secondary | ICD-10-CM | POA: Diagnosis not present

## 2015-06-09 DIAGNOSIS — M6281 Muscle weakness (generalized): Secondary | ICD-10-CM | POA: Diagnosis not present

## 2015-06-09 DIAGNOSIS — G20 Parkinson's disease: Secondary | ICD-10-CM | POA: Diagnosis not present

## 2015-06-09 DIAGNOSIS — F329 Major depressive disorder, single episode, unspecified: Secondary | ICD-10-CM | POA: Diagnosis not present

## 2015-06-11 DIAGNOSIS — R2689 Other abnormalities of gait and mobility: Secondary | ICD-10-CM | POA: Diagnosis not present

## 2015-06-11 DIAGNOSIS — F329 Major depressive disorder, single episode, unspecified: Secondary | ICD-10-CM | POA: Diagnosis not present

## 2015-06-11 DIAGNOSIS — M6281 Muscle weakness (generalized): Secondary | ICD-10-CM | POA: Diagnosis not present

## 2015-06-11 DIAGNOSIS — G20 Parkinson's disease: Secondary | ICD-10-CM | POA: Diagnosis not present

## 2015-06-11 DIAGNOSIS — M5136 Other intervertebral disc degeneration, lumbar region: Secondary | ICD-10-CM | POA: Diagnosis not present

## 2015-06-11 DIAGNOSIS — M25561 Pain in right knee: Secondary | ICD-10-CM | POA: Diagnosis not present

## 2015-06-15 DIAGNOSIS — Z79899 Other long term (current) drug therapy: Secondary | ICD-10-CM | POA: Diagnosis not present

## 2015-06-15 DIAGNOSIS — F419 Anxiety disorder, unspecified: Secondary | ICD-10-CM | POA: Diagnosis not present

## 2015-06-15 DIAGNOSIS — F338 Other recurrent depressive disorders: Secondary | ICD-10-CM | POA: Diagnosis not present

## 2015-06-15 DIAGNOSIS — F23 Brief psychotic disorder: Secondary | ICD-10-CM | POA: Diagnosis not present

## 2015-06-15 DIAGNOSIS — I10 Essential (primary) hypertension: Secondary | ICD-10-CM | POA: Diagnosis not present

## 2015-06-15 DIAGNOSIS — F339 Major depressive disorder, recurrent, unspecified: Secondary | ICD-10-CM | POA: Diagnosis not present

## 2015-06-15 DIAGNOSIS — G20 Parkinson's disease: Secondary | ICD-10-CM | POA: Diagnosis not present

## 2015-06-15 DIAGNOSIS — N644 Mastodynia: Secondary | ICD-10-CM | POA: Diagnosis not present

## 2015-06-15 DIAGNOSIS — Z87442 Personal history of urinary calculi: Secondary | ICD-10-CM | POA: Diagnosis not present

## 2015-06-16 DIAGNOSIS — G20 Parkinson's disease: Secondary | ICD-10-CM | POA: Diagnosis not present

## 2015-06-16 DIAGNOSIS — R2689 Other abnormalities of gait and mobility: Secondary | ICD-10-CM | POA: Diagnosis not present

## 2015-06-16 DIAGNOSIS — M5136 Other intervertebral disc degeneration, lumbar region: Secondary | ICD-10-CM | POA: Diagnosis not present

## 2015-06-16 DIAGNOSIS — M6281 Muscle weakness (generalized): Secondary | ICD-10-CM | POA: Diagnosis not present

## 2015-06-16 DIAGNOSIS — M25561 Pain in right knee: Secondary | ICD-10-CM | POA: Diagnosis not present

## 2015-06-16 DIAGNOSIS — F329 Major depressive disorder, single episode, unspecified: Secondary | ICD-10-CM | POA: Diagnosis not present

## 2015-06-18 DIAGNOSIS — Z6841 Body Mass Index (BMI) 40.0 and over, adult: Secondary | ICD-10-CM | POA: Diagnosis not present

## 2015-06-18 DIAGNOSIS — Z1389 Encounter for screening for other disorder: Secondary | ICD-10-CM | POA: Diagnosis not present

## 2015-06-18 DIAGNOSIS — M5136 Other intervertebral disc degeneration, lumbar region: Secondary | ICD-10-CM | POA: Diagnosis not present

## 2015-06-18 DIAGNOSIS — F329 Major depressive disorder, single episode, unspecified: Secondary | ICD-10-CM | POA: Diagnosis not present

## 2015-06-18 DIAGNOSIS — Z888 Allergy status to other drugs, medicaments and biological substances status: Secondary | ICD-10-CM | POA: Diagnosis not present

## 2015-06-18 DIAGNOSIS — G20 Parkinson's disease: Secondary | ICD-10-CM | POA: Diagnosis not present

## 2015-06-18 DIAGNOSIS — R2689 Other abnormalities of gait and mobility: Secondary | ICD-10-CM | POA: Diagnosis not present

## 2015-06-18 DIAGNOSIS — M25561 Pain in right knee: Secondary | ICD-10-CM | POA: Diagnosis not present

## 2015-06-18 DIAGNOSIS — M6281 Muscle weakness (generalized): Secondary | ICD-10-CM | POA: Diagnosis not present

## 2015-06-18 DIAGNOSIS — G894 Chronic pain syndrome: Secondary | ICD-10-CM | POA: Diagnosis not present

## 2015-06-22 DIAGNOSIS — M5136 Other intervertebral disc degeneration, lumbar region: Secondary | ICD-10-CM | POA: Diagnosis not present

## 2015-06-22 DIAGNOSIS — M6281 Muscle weakness (generalized): Secondary | ICD-10-CM | POA: Diagnosis not present

## 2015-06-22 DIAGNOSIS — R2689 Other abnormalities of gait and mobility: Secondary | ICD-10-CM | POA: Diagnosis not present

## 2015-06-22 DIAGNOSIS — G20 Parkinson's disease: Secondary | ICD-10-CM | POA: Diagnosis not present

## 2015-06-22 DIAGNOSIS — F329 Major depressive disorder, single episode, unspecified: Secondary | ICD-10-CM | POA: Diagnosis not present

## 2015-06-22 DIAGNOSIS — M25561 Pain in right knee: Secondary | ICD-10-CM | POA: Diagnosis not present

## 2015-06-24 ENCOUNTER — Encounter: Payer: Self-pay | Admitting: Neurology

## 2015-06-24 ENCOUNTER — Ambulatory Visit (INDEPENDENT_AMBULATORY_CARE_PROVIDER_SITE_OTHER): Payer: Medicare Other | Admitting: Neurology

## 2015-06-24 VITALS — BP 122/78 | HR 76 | Resp 22 | Ht 66.0 in | Wt 284.0 lb

## 2015-06-24 DIAGNOSIS — G20 Parkinson's disease: Secondary | ICD-10-CM

## 2015-06-24 DIAGNOSIS — E669 Obesity, unspecified: Secondary | ICD-10-CM

## 2015-06-24 NOTE — Progress Notes (Signed)
Subjective:    Patient ID: Joe [name] is a 65 y.o. male.  HPI     Interim history:   Mr. Joe [name] is a 65 year old right-handed gentleman with an underlying medical history of degenerative joint disease, hypertension, obesity, anxiety, arthritis, borderline diabetes and renal calculi, who presents for follow-up consultation of his gait disorder and parkinsonism. The patient is unaccompanied today and was Brought by transportation. He resides at [facility], in assisted living. I last saw him on 12/25/2014, at which time he reported doing ok. He had not fallen, was going to exercise class about 5 times a week. He was trying to drink enough water. He has a remote Hx of kidney stones. He had a sleep study at [facility], but was not on CPAP. On examination, he had stable and mild parkinsonism. I suggested he continue with Sinemet 1 pill 3 times a day.  Today, 06/24/2015: He reports doing okay, no new complaints, no recent falls, no reports from Assisted Living facility as to any interim problems, still has some problems sleeping at night, he tries to participate in exercise classes. He tries to drink enough water.     Previously:  I first met him on 06/06/2014, at which time he reported feeling stable. He was participating in exercise at the assisted living facility. He had no recent falls. He was trying to drink enough water. He denied any hallucinations or lower extremity swelling. He had gained weight. He reported snoring. I suggested we proceed with a sleep study. He canceled the sleep study in the interim. He was taking: Xanax 1 mg 3 times a day as well as Ambien at night. His other medications include fluoxetine 20 mg each morning, lisinopril 2.5 mg each morning, meloxicam 15 mg daily, vitamin D, Ultracet twice daily, carbidopa-levodopa 25-100 milligrams strength one tablet 3 times a day, at 8, 12, and 4 PM. I did not change any medications. I did worry about his Xanax dose. It was not  clear how long he had been on it. He indicated taking it for years.  06/06/2014: This is his first visit with me and he previously had seen Dr. Jim Like and was evaluated by him on 11/29/2013, at which time Dr. 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 The patient was on Sinemet treatment and had benefited from it and Dr. Janann Colonel continued his Sinemet 25-100 milligrams strength one pill 3 times a day.    Symptoms go back to last year when he started falling. He was admitted to [facility]. He was diagnosed with parkinsonism during the hospital stay as I understand. He's not able to give a concise history. He had noted tremor in his hands. He felt Sinemet was helpful. He has no side effects from it. He denies lightheadedness. One time in the past he fell out of bed because he had a vivid dream. He has 2 daughters, one is an Therapist, sports at [employer] and the other is an Therapist, sports in [place].  His Past Medical History Is Significant For: Past Medical History  Diagnosis Date  . Anxiety   . Arthritis     osteoarthritis  . DJD (degenerative joint disease)   . Hypertension   . Obesity   . Borderline diabetes   . H/O renal calculi 1992  . Sleep apnea     STOP BANG; 7    His Past Surgical History Is Significant For: Past Surgical  History  Procedure Laterality Date  . Cystoscopy      with kidney stone removal-mmh  . Appendectomy  81     mmh  . Hernia repair      umbilical hernia repair-mmh  . Cataract extraction w/phaco  12/09/2010    Procedure: CATARACT EXTRACTION PHACO AND INTRAOCULAR LENS PLACEMENT (IOC);  Surgeon: Tonny Branch;  Location: [facility] ORS;  Service: Ophthalmology;  Laterality: Right;  CDE: 36.21  . Colonoscopy  09/20/2011    Procedure: COLONOSCOPY;  Surgeon: Jamesetta So, MD;  Location: [facility] ENDO SUITE;  Service: Gastroenterology;  Laterality: N/A;  . Cataract extraction w/phaco  10/24/2011    Procedure:  CATARACT EXTRACTION PHACO AND INTRAOCULAR LENS PLACEMENT (IOC);  Surgeon: Tonny Branch, MD;  Location: [facility] ORS;  Service: Ophthalmology;  Laterality: Left;  CDE 10.35    His Family History Is Significant For: Family History  Problem Relation Age of Onset  . Anesthesia problems Neg Hx   . Hypotension Neg Hx   . Malignant hyperthermia Neg Hx   . Pseudochol deficiency Neg Hx   . Heart disease    . Cancer    . Hypertension      His Social History Is Significant For: Social History   Social History  . Marital Status: Divorced    Spouse Name: N/A  . Number of Children: 2  . Years of Education: 12   Occupational History  .    [employer] Disabled     Social History Main Topics  . Smoking status: Never Smoker   . Smokeless tobacco: Never Used  . Alcohol Use: No  . Drug Use: No  . Sexual Activity: Not Asked   Other Topics Concern  . None   Social History Narrative   Patient lives at [facility].     Patient is disabled.    Patient has a high school education.    Patient is divorced.    Patient has 2 children.    Patient is right handed.    His Allergies Are:  No Known Allergies:   His Current Medications Are:  Outpatient Encounter Prescriptions as of 06/24/2015  Medication Sig  . ALPRAZolam (XANAX) 1 MG tablet Take 1 mg by mouth 3 (three) times daily as needed. For anxiety  . amitriptyline (ELAVIL) 25 MG tablet Take 25 mg by mouth at bedtime.  . carbidopa-levodopa (SINEMET IR) 25-100 MG per tablet Take 1 tablet by mouth 3 (three) times daily.  . cholecalciferol (VITAMIN D) 400 UNITS TABS tablet Take 400 Units by mouth.  . cyclobenzaprine (FLEXERIL) 10 MG tablet Take 10 mg by mouth every 8 (eight) hours as needed for muscle spasms.  Marland Kitchen FLUoxetine (PROZAC) 20 MG capsule Take 20 mg by mouth daily.  Marland Kitchen HYDROcodone-acetaminophen (NORCO/VICODIN) 5-325 MG tablet Take 1 tablet by mouth every 4 (four) hours as needed for moderate pain.  Marland Kitchen lisinopril (PRINIVIL,ZESTRIL) 2.5 MG tablet    . Multiple Vitamin (MULTIVITAMIN) capsule Take 1 capsule by mouth daily.  [place] (OYSTER CALCIUM) 500 MG TABS tablet Take 500 mg of elemental calcium by mouth 2 (two) times daily.  . ranitidine (ZANTAC) 150 MG tablet Take 150 mg by mouth 2 (two) times daily.  . traMADol-acetaminophen (ULTRACET) 37.5-325 MG per tablet Take 1 tablet by mouth 2 (two) times daily.  . traZODone (DESYREL) 50 MG tablet   . [DISCONTINUED] lisinopril (PRINIVIL,ZESTRIL) 5 MG tablet Take 5 mg by mouth daily.  . [DISCONTINUED] meloxicam (MOBIC) 15 MG tablet Take 15 mg by mouth  daily.   No facility-administered encounter medications on file as of 06/24/2015.  :  Review of Systems:  Out of a complete 14 point review of systems, all are reviewed and negative with the exception of these symptoms as listed below:   Review of Systems  Neurological:       No new problems per patient. States that C/L really helps his tremors.     Objective:  Neurologic Exam  Physical Exam Physical Examination:   Filed Vitals:   06/24/15 1408  BP: 122/78  Pulse: 76  Resp: 22    General Examination: The patient is a very pleasant 65 y.o. male in no acute distress.  HEENT: Normocephalic, atraumatic, pupils are equal, round and reactive to light and accommodation. Extraocular tracking shows mild saccadic breakdown without nystagmus noted. There is no limitation to his gaze. There is minimal decrease in eye blink rate. Hearing is intact. Face is symmetric with minimal facial masking and normal facial sensation. There is no lip, neck or jaw tremor. Neck is very mildly rigid with intact passive ROM. There are no carotid bruits on auscultation. Oropharynx exam reveals moderate mouth dryness, moderate airway crowding secondary to narrow airway entry and thicker soft palate. Tonsils are 1+. He has no oropharyngeal dyskinesias and no sialorrhea.  Chest: is clear to auscultation without wheezing, rhonchi or crackles noted.  Heart:  sounds are regular and normal without murmurs, rubs or gallops noted.   Abdomen: is soft, non-tender and non-distended with normal bowel sounds appreciated on auscultation.  Extremities: There is trace edema in the distal lower extremities, around the ankles bilaterally. Pedal pulses are intact.   Skin: is warm and dry with no trophic changes noted. Age-related changes are noted on the skin.   Musculoskeletal: exam reveals no obvious joint deformities, tenderness, joint swelling or erythema.  Neurologically:  Mental status: The patient is awake and alert, paying good  attention. He is able to provide the history, but not well. He is oriented to: person, place, time/date, situation, day of week, month of year and year. His memory, attention, language and knowledge are fair. There is no aphasia, agnosia, apraxia or anomia. There is a mild degree of bradyphrenia. Speech is mildly hypophonic with no dysarthria noted. Mood is congruent and affect is normal.   Cranial nerves are as described above under HEENT exam. In addition, shoulder shrug is normal with equal shoulder height noted.  Motor exam: Normal bulk, and strength for age is noted. There are no dyskinesias noted.  Tone is mildly rigid with absence of cogwheeling. There is overall mild bradykinesia. There is no drift or rebound.  There is there is no resting. He has a very mild postural and action tremor in both upper extremities, equal. Fine motor skills are mildly impaired in the right upper extremity and minimally impaired in the left upper extremity. Foot agility and foot taps are mild to moderately impaired bilaterally. He stands up with mild difficulty and has to push himself up. Tandem walk is not possible. Romberg shows mild sway. He walks with decreased arm swing bilaterally, more noticeable on the right. He turns in 3 steps. Balance is mildly impaired. He does not have a walking aid. Sensory exam is intact to light touch in the upper  and lower extremities. Reflexes are 2+ in the upper extremities and trace in the lower extremities. Finger to nose testing is unremarkable and heel to shin is difficult bilaterally.  Assessment and Plan:   In summary, Jabarie W  Bona is a very pleasant 65 year old male with an underlying medical history of degenerative joint disease, hypertension, obesity, anxiety, arthritis, borderline diabetes and renal calculi, who presents for follow-up consultation of his parkinsonism. He has been on Sinemet 1 pill 3 times a day and reported improvement in his tremor and is walking. On examination, he has stable mild parkinsonism. Per history, he had improvement with Sinemet with respect to his gait and mobility when he was hospitalized after recurrent falls and placed on Sinemet. He also was diagnosed with obstructive sleep apnea but has not had any follow-up with Dr. Merlene Laughter for this as I understand. I suggested that he continue with Sinemet at the current dose. I suggested a follow-up in 6-8 months, sooner if needed. I answered all his questions today and he was in agreement.  I spent 20 minutes in total face-to-face time with the patient, more than 50% of which was spent in counseling and coordination of care, reviewing test results, reviewing medication and discussing or reviewing the diagnosis of parkinsonism, its prognosis and treatment options.

## 2015-06-24 NOTE — Patient Instructions (Signed)
Your exam is stable. We will continue with Sinemet 1 pill 3 times a day, follow up in 6-8 months

## 2015-06-25 DIAGNOSIS — M25561 Pain in right knee: Secondary | ICD-10-CM | POA: Diagnosis not present

## 2015-06-25 DIAGNOSIS — M6281 Muscle weakness (generalized): Secondary | ICD-10-CM | POA: Diagnosis not present

## 2015-06-25 DIAGNOSIS — F329 Major depressive disorder, single episode, unspecified: Secondary | ICD-10-CM | POA: Diagnosis not present

## 2015-06-25 DIAGNOSIS — R2689 Other abnormalities of gait and mobility: Secondary | ICD-10-CM | POA: Diagnosis not present

## 2015-06-25 DIAGNOSIS — M5136 Other intervertebral disc degeneration, lumbar region: Secondary | ICD-10-CM | POA: Diagnosis not present

## 2015-06-25 DIAGNOSIS — G20 Parkinson's disease: Secondary | ICD-10-CM | POA: Diagnosis not present

## 2015-07-01 DIAGNOSIS — M5136 Other intervertebral disc degeneration, lumbar region: Secondary | ICD-10-CM | POA: Diagnosis not present

## 2015-07-01 DIAGNOSIS — R2689 Other abnormalities of gait and mobility: Secondary | ICD-10-CM | POA: Diagnosis not present

## 2015-07-01 DIAGNOSIS — F329 Major depressive disorder, single episode, unspecified: Secondary | ICD-10-CM | POA: Diagnosis not present

## 2015-07-01 DIAGNOSIS — G20 Parkinson's disease: Secondary | ICD-10-CM | POA: Diagnosis not present

## 2015-07-01 DIAGNOSIS — M25561 Pain in right knee: Secondary | ICD-10-CM | POA: Diagnosis not present

## 2015-07-01 DIAGNOSIS — M6281 Muscle weakness (generalized): Secondary | ICD-10-CM | POA: Diagnosis not present

## 2015-07-16 DIAGNOSIS — Z1389 Encounter for screening for other disorder: Secondary | ICD-10-CM | POA: Diagnosis not present

## 2015-07-16 DIAGNOSIS — G20 Parkinson's disease: Secondary | ICD-10-CM | POA: Diagnosis not present

## 2015-07-16 DIAGNOSIS — F330 Major depressive disorder, recurrent, mild: Secondary | ICD-10-CM | POA: Diagnosis not present

## 2015-07-16 DIAGNOSIS — R109 Unspecified abdominal pain: Secondary | ICD-10-CM | POA: Diagnosis not present

## 2015-07-16 DIAGNOSIS — Z6841 Body Mass Index (BMI) 40.0 and over, adult: Secondary | ICD-10-CM | POA: Diagnosis not present

## 2015-07-16 DIAGNOSIS — E669 Obesity, unspecified: Secondary | ICD-10-CM | POA: Diagnosis not present

## 2015-07-28 DIAGNOSIS — Z6839 Body mass index (BMI) 39.0-39.9, adult: Secondary | ICD-10-CM | POA: Diagnosis not present

## 2015-07-28 DIAGNOSIS — Z1389 Encounter for screening for other disorder: Secondary | ICD-10-CM | POA: Diagnosis not present

## 2015-07-28 DIAGNOSIS — M7732 Calcaneal spur, left foot: Secondary | ICD-10-CM | POA: Diagnosis not present

## 2015-07-28 DIAGNOSIS — M79622 Pain in left upper arm: Secondary | ICD-10-CM | POA: Diagnosis not present

## 2015-08-19 DIAGNOSIS — M79674 Pain in right toe(s): Secondary | ICD-10-CM | POA: Diagnosis not present

## 2015-08-19 DIAGNOSIS — B351 Tinea unguium: Secondary | ICD-10-CM | POA: Diagnosis not present

## 2015-08-19 DIAGNOSIS — M79675 Pain in left toe(s): Secondary | ICD-10-CM | POA: Diagnosis not present

## 2015-09-03 DIAGNOSIS — K432 Incisional hernia without obstruction or gangrene: Secondary | ICD-10-CM | POA: Diagnosis not present

## 2015-09-04 NOTE — H&P (Signed)
  NTS SOAP Note  Vital Signs:  Vitals as of: [id number]: Systolic [id number]: Diastolic 65: Heart Rate 78: Temp 16F (Temporal): Height 65ft 7in: Weight 239Lbs 0 Ounces: Pain Level 4: BMI 37.43   BMI : 37.43 kg/m2  Subjective: This 65 year old male presents for of periumbilical pain.  Has been present for some time, but is increasing in size and causing him discomfort.  Made worse with coughing or straining.  Resolves when lying down.  No nausea, vomiting.  s/p umbilical herniorrhaphy in remote past.  Review of Symptoms:  Constitutional:negative tremors Eyes:pain bilateral Nose/Mouth/Throat:negative Cardiovascular:negative Respiratory:negative Gastrointestinnegative Genitourinary:negative back pain Skin:negative Hematolgic/Lymphatic:negative Allergic/Immunologic:negative   Past Medical History:Reviewed  Past Medical History  Surgical History: umbilical herniorrhaphy in remote past, appendectomy Medical Problems: Parkinson's, HTN Allergies: nkda Medications: fluoxetine, lisinopril, sinemet, trazodone, vicodin, ultracet, zantac   Social History:Reviewed  Social History  Preferred Language: English Race:  Other Ethnicity: Not Hispanic / Latino Age: 45 year Marital Status:  S Alcohol: no   Smoking Status: Never smoker reviewed on 09/03/2015 Functional Status reviewed on 09/03/2015 ------------------------------------------------ Bathing: Normal Cooking: Normal Dressing: Normal Driving: Normal Eating: Normal Managing Meds: Normal Oral Care: Normal Shopping: Normal Toileting: Normal Transferring: Normal Walking: Normal Cognitive Status reviewed on 09/03/2015 ------------------------------------------------ Attention: Normal Decision Making: Normal Language: Normal Memory: Normal Motor: Normal Perception: Normal Problem Solving: Normal Visual and Spatial: Normal   Family History:Reviewed  Family Health History Mother, Deceased; Cancer  unspecified;  Father, Deceased; History Unknown    Objective Information: General:Well appearing, well nourished in no distress. Skin:no rash or prominent lesions Head:Atraumatic; no masses; no abnormalities Neck:Supple without lymphadenopathy.  Heart:RRR, no murmur or gallop.  Normal S1, S2.  No S3, S4.  Lungs:CTA bilaterally, no wheezes, rhonchi, rales.  Breathing unlabored. Abdomen:Soft, NT/ND, normal bowel sounds, no HSM, no masses.  No peritoneal signs.  Reducible incisional hernia at umbilicus, beneath surgical scar.  Diastasis recti present.  Assessment:Incisional hernia  Diagnoses: 553.21  K43.2 Incisional hernia (Incisional hernia without obstruction or gangrene)  Procedures: [id number] - OFFICE OUTPATIENT NEW 30 MINUTES    Plan:  Scheduled for incisional herniorrhaphy with mesh on 09/16/15.   Patient Education:Alternative treatments to surgery were discussed with patient (and family).Risks and benefits  of procedure including bleeding, infection, mesh use, and recurrence of the hernia were fully explained to the patient (and family) who gave informed consent. Patient/family questions were addressed.  Follow-up:Pending Surgery

## 2015-09-11 NOTE — Patient Instructions (Signed)
Joe [name]  09/11/2015     @PREFPERIOPPHARMACY @   Your procedure is scheduled on 09/16/2015.  Report to [facility] at 7:00 A.M.  Call this number if you have problems the morning of surgery:  [phone]   Remember:  Do not eat food or drink liquids after midnight.  Take these medicines the morning of surgery with A SIP OF WATER Xanax, Sinemet, Flexeril, Prozac, Lisinopril, Phenergan, Zantac, Tramadol if needed   Do not wear jewelry, make-up or nail polish.  Do not wear lotions, powders, or perfumes.  You may wear deodorant.  Do not shave 48 hours prior to surgery.  Men may shave face and neck.  Do not bring valuables to the hospital.  [facility] is not responsible for any belongings or valuables.  Contacts, dentures or bridgework may not be worn into surgery.  Leave your suitcase in the car.  After surgery it may be brought to your room.  For patients admitted to the hospital, discharge time will be determined by your treatment team.  Patients discharged the day of surgery will not be allowed to drive home.    Please read over the following fact sheets that you were given. Surgical Site Infection Prevention and Anesthesia Post-op Instructions     PATIENT INSTRUCTIONS POST-ANESTHESIA  IMMEDIATELY FOLLOWING SURGERY:  Do not drive or operate machinery for the first twenty four hours after surgery.  Do not make any important decisions for twenty four hours after surgery or while taking narcotic pain medications or sedatives.  If you develop intractable nausea and vomiting or a severe headache please notify your doctor immediately.  FOLLOW-UP:  Please make an appointment with your surgeon as instructed. You do not need to follow up with anesthesia unless specifically instructed to do so.  WOUND CARE INSTRUCTIONS (if applicable):  Keep a dry clean dressing on the anesthesia/puncture wound site if there is drainage.  Once the wound has quit draining you may leave it open to  air.  Generally you should leave the bandage intact for twenty four hours unless there is drainage.  If the epidural site drains for more than 36-48 hours please call the anesthesia department.  QUESTIONS?:  Please feel free to call your physician or the hospital operator if you have any questions, and they will be happy to assist you.      Hernia, Adult A hernia is the bulging of an organ or tissue through a weak spot in the muscles of the abdomen (abdominal wall). Hernias develop most often near the navel or groin. There are many kinds of hernias. Common kinds include:  Femoral hernia. This kind of hernia develops under the groin in the upper thigh area.  Inguinal hernia. This kind of hernia develops in the groin or scrotum.  Umbilical hernia. This kind of hernia develops near the navel.  Hiatal hernia. This kind of hernia causes part of the stomach to be pushed up into the chest.  Incisional hernia. This kind of hernia bulges through a scar from an abdominal surgery. CAUSES This condition may be caused by:  Heavy lifting.  Coughing over a long period of time.  Straining to have a bowel movement.  An incision made during an abdominal surgery.  A birth defect (congenital defect).  Excess weight or obesity.  Smoking.  Poor nutrition.  Cystic fibrosis.  Excess fluid in the abdomen.  Undescended testicles. SYMPTOMS Symptoms of a hernia include:  A lump on the abdomen. This is the first  sign of a hernia. The lump may become more obvious with standing, straining, or coughing. It may get bigger over time if it is not treated or if the condition causing it is not treated.  Pain. A hernia is usually painless, but it may become painful over time if treatment is delayed. The pain is usually dull and may get worse with standing or lifting heavy objects. Sometimes a hernia gets tightly squeezed in the weak spot (strangulated) or stuck there (incarcerated) and causes additional  symptoms. These symptoms may include:  Vomiting.  Nausea.  Constipation.  Irritability. DIAGNOSIS A hernia may be diagnosed with:  A physical exam. During the exam your health care provider may ask you to cough or to make a specific movement, because a hernia is usually more visible when you move.  Imaging tests. These can include:  X-rays.  Ultrasound.  CT scan. TREATMENT A hernia that is small and painless may not need to be treated. A hernia that is large or painful may be treated with surgery. Inguinal hernias may be treated with surgery to prevent incarceration or strangulation. Strangulated hernias are always treated with surgery, because lack of blood to the trapped organ or tissue can cause it to die. Surgery to treat a hernia involves pushing the bulge back into place and repairing the weak part of the abdomen. HOME CARE INSTRUCTIONS  Avoid straining.  Do not lift anything heavier than 10 lb (4.5 kg).  Lift with your leg muscles, not your back muscles. This helps avoid strain.  When coughing, try to cough gently.  Prevent constipation. Constipation leads to straining with bowel movements, which can make a hernia worse or cause a hernia repair to break down. You can prevent constipation by:  Eating a high-fiber diet that includes plenty of fruits and vegetables.  Drinking enough fluids to keep your urine clear or pale yellow. Aim to drink 6-8 glasses of water per day.  Using a stool softener as directed by your health care provider.  Lose weight, if you are overweight.  Do not use any tobacco products, including cigarettes, chewing tobacco, or electronic cigarettes. If you need help quitting, ask your health care provider.  Keep all follow-up visits as directed by your health care provider. This is important. Your health care provider may need to monitor your condition. SEEK MEDICAL CARE IF:  You have swelling, redness, and pain in the affected area.  Your  bowel habits change. SEEK IMMEDIATE MEDICAL CARE IF:  You have a fever.  You have abdominal pain that is getting worse.  You feel nauseous or you vomit.  You cannot push the hernia back in place by gently pressing on it while you are lying down.  The hernia:  Changes in shape or size.  Is stuck outside the abdomen.  Becomes discolored.  Feels hard or tender.   This information is not intended to replace advice given to you by your health care provider. Make sure you discuss any questions you have with your health care provider.   Document Released: 03/21/2005 Document Revised: 04/11/2014 Document Reviewed: 01/29/2014 Elsevier Interactive Patient Education [employer].

## 2015-09-14 ENCOUNTER — Encounter (HOSPITAL_COMMUNITY): Payer: Self-pay

## 2015-09-14 ENCOUNTER — Encounter (HOSPITAL_COMMUNITY)
Admission: RE | Admit: 2015-09-14 | Discharge: 2015-09-14 | Disposition: A | Discharge status: Home / Self Care | Payer: Medicare Other | Source: Ambulatory Visit | Attending: General Surgery | Admitting: General Surgery

## 2015-09-14 ENCOUNTER — Other Ambulatory Visit: Payer: Self-pay

## 2015-09-14 DIAGNOSIS — F419 Anxiety disorder, unspecified: Secondary | ICD-10-CM | POA: Diagnosis not present

## 2015-09-14 DIAGNOSIS — Z79899 Other long term (current) drug therapy: Secondary | ICD-10-CM | POA: Diagnosis not present

## 2015-09-14 DIAGNOSIS — G4730 Sleep apnea, unspecified: Secondary | ICD-10-CM | POA: Diagnosis not present

## 2015-09-14 DIAGNOSIS — G20 Parkinson's disease: Secondary | ICD-10-CM | POA: Diagnosis not present

## 2015-09-14 DIAGNOSIS — K219 Gastro-esophageal reflux disease without esophagitis: Secondary | ICD-10-CM | POA: Diagnosis not present

## 2015-09-14 DIAGNOSIS — I10 Essential (primary) hypertension: Secondary | ICD-10-CM | POA: Diagnosis not present

## 2015-09-14 DIAGNOSIS — K432 Incisional hernia without obstruction or gangrene: Secondary | ICD-10-CM | POA: Diagnosis not present

## 2015-09-14 DIAGNOSIS — Z01810 Encounter for preprocedural cardiovascular examination: Secondary | ICD-10-CM | POA: Diagnosis not present

## 2015-09-14 DIAGNOSIS — Z01812 Encounter for preprocedural laboratory examination: Secondary | ICD-10-CM | POA: Diagnosis not present

## 2015-09-14 HISTORY — DX: Gastro-esophageal reflux disease without esophagitis: K21.9

## 2015-09-14 HISTORY — DX: Parkinson's disease without dyskinesia, without mention of fluctuations: G20.A1

## 2015-09-14 HISTORY — DX: Parkinson's disease: G20

## 2015-09-14 LAB — CBC WITH DIFFERENTIAL/PLATELET
Basophils Absolute: 0 10*3/uL (ref 0.0–0.1)
Basophils Relative: 0 %
EOS PCT: 2 %
Eosinophils Absolute: 0.1 10*3/uL (ref 0.0–0.7)
HEMATOCRIT: 42.6 % (ref 39.0–52.0)
HEMOGLOBIN: 14.2 g/dL (ref 13.0–17.0)
LYMPHS ABS: 1.7 10*3/uL (ref 0.7–4.0)
LYMPHS PCT: 25 %
MCH: 32 pg (ref 26.0–34.0)
MCHC: 33.3 g/dL (ref 30.0–36.0)
MCV: 95.9 fL (ref 78.0–100.0)
MONO ABS: 0.7 10*3/uL (ref 0.1–1.0)
MONOS PCT: 10 %
Neutro Abs: 4.4 10*3/uL (ref 1.7–7.7)
Neutrophils Relative %: 63 %
Platelets: 175 10*3/uL (ref 150–400)
RBC: 4.44 MIL/uL (ref 4.22–5.81)
RDW: 13.3 % (ref 11.5–15.5)
WBC: 7 10*3/uL (ref 4.0–10.5)

## 2015-09-14 LAB — BASIC METABOLIC PANEL
Anion gap: 5 (ref 5–15)
BUN: 17 mg/dL (ref 6–20)
CHLORIDE: 103 mmol/L (ref 101–111)
CO2: 28 mmol/L (ref 22–32)
CREATININE: 0.93 mg/dL (ref 0.61–1.24)
Calcium: 9.2 mg/dL (ref 8.9–10.3)
GFR calc Af Amer: >60 mL/min (ref >60)
GFR calc non Af Amer: >60 mL/min (ref >60)
GLUCOSE: 97 mg/dL (ref 65–99)
POTASSIUM: 4.3 mmol/L (ref 3.5–5.1)
Sodium: 136 mmol/L (ref 135–145)

## 2015-09-14 NOTE — Pre-Procedure Instructions (Signed)
Patient given information to sign up for my chart at home. 

## 2015-09-14 NOTE — Pre-Procedure Instructions (Signed)
Patient in for PAT. He is a resident at [facility] in [place]. States that he will ride [facility] here and back to [place]. Instructed him that he would have to have a responsible adult with him to go back to [place] after his surgery. I called [facility] and spoke with Neoma Laming and told her this information. She said she would talk to her manager and call me back about who would be with him. Gave patient two copies of preop instructions and went over them verbally with him. He verbalizes understanding of instructions and will give the second copy to his nurse.

## 2015-09-16 ENCOUNTER — Ambulatory Visit (HOSPITAL_COMMUNITY)
Admission: RE | Admit: 2015-09-16 | Discharge: 2015-09-16 | Disposition: A | Discharge status: Home / Self Care | Payer: Medicare Other | Source: Ambulatory Visit | Attending: General Surgery | Admitting: General Surgery

## 2015-09-16 ENCOUNTER — Ambulatory Visit (HOSPITAL_COMMUNITY): Payer: Medicare Other | Admitting: Anesthesiology

## 2015-09-16 ENCOUNTER — Encounter (HOSPITAL_COMMUNITY)
Admission: RE | Disposition: A | Discharge status: Home / Self Care | Payer: Self-pay | Source: Ambulatory Visit | Attending: General Surgery

## 2015-09-16 ENCOUNTER — Encounter (HOSPITAL_COMMUNITY): Payer: Self-pay | Admitting: Anesthesiology

## 2015-09-16 DIAGNOSIS — K432 Incisional hernia without obstruction or gangrene: Secondary | ICD-10-CM | POA: Diagnosis not present

## 2015-09-16 DIAGNOSIS — Z01810 Encounter for preprocedural cardiovascular examination: Secondary | ICD-10-CM | POA: Diagnosis not present

## 2015-09-16 DIAGNOSIS — I10 Essential (primary) hypertension: Secondary | ICD-10-CM | POA: Insufficient documentation

## 2015-09-16 DIAGNOSIS — F419 Anxiety disorder, unspecified: Secondary | ICD-10-CM | POA: Insufficient documentation

## 2015-09-16 DIAGNOSIS — G20 Parkinson's disease: Secondary | ICD-10-CM | POA: Insufficient documentation

## 2015-09-16 DIAGNOSIS — Z79899 Other long term (current) drug therapy: Secondary | ICD-10-CM | POA: Insufficient documentation

## 2015-09-16 DIAGNOSIS — G4730 Sleep apnea, unspecified: Secondary | ICD-10-CM | POA: Insufficient documentation

## 2015-09-16 DIAGNOSIS — K219 Gastro-esophageal reflux disease without esophagitis: Secondary | ICD-10-CM | POA: Insufficient documentation

## 2015-09-16 DIAGNOSIS — Z01812 Encounter for preprocedural laboratory examination: Secondary | ICD-10-CM | POA: Insufficient documentation

## 2015-09-16 HISTORY — PX: INCISIONAL HERNIA REPAIR: SHX193

## 2015-09-16 HISTORY — PX: INSERTION OF MESH: SHX5868

## 2015-09-16 SURGERY — REPAIR, HERNIA, INCISIONAL
Anesthesia: General

## 2015-09-16 MED ORDER — PROPOFOL 10 MG/ML IV BOLUS
INTRAVENOUS | Status: AC
  Filled 2015-09-16: qty 20

## 2015-09-16 MED ORDER — SUCCINYLCHOLINE CHLORIDE 20 MG/ML IJ SOLN
INTRAMUSCULAR | Status: AC
  Filled 2015-09-16: qty 1

## 2015-09-16 MED ORDER — TRAMADOL-ACETAMINOPHEN 37.5-325 MG PO TABS: 1.0000 | ORAL_TABLET | Freq: Four times a day (QID) | ORAL | Status: DC | PRN

## 2015-09-16 MED ORDER — CHLORHEXIDINE GLUCONATE 4 % EX LIQD: 1.0000 "application " | Freq: Once | CUTANEOUS | Status: DC

## 2015-09-16 MED ORDER — EPHEDRINE SULFATE 50 MG/ML IJ SOLN
INTRAMUSCULAR | Status: AC
  Filled 2015-09-16: qty 1

## 2015-09-16 MED ORDER — FENTANYL CITRATE (PF) 100 MCG/2ML IJ SOLN
INTRAMUSCULAR | Status: AC
  Filled 2015-09-16: qty 2

## 2015-09-16 MED ORDER — MIDAZOLAM HCL 2 MG/2ML IJ SOLN
1.0000 mg | INTRAMUSCULAR | Status: DC | PRN
Start: 2015-09-16 — End: 2015-09-16
  Administered 2015-09-16: 1 mg via INTRAVENOUS

## 2015-09-16 MED ORDER — KETOROLAC TROMETHAMINE 30 MG/ML IJ SOLN
INTRAMUSCULAR | Status: AC
  Filled 2015-09-16: qty 1

## 2015-09-16 MED ORDER — BUPIVACAINE HCL (PF) 0.5 % IJ SOLN
INTRAMUSCULAR | Status: AC
  Filled 2015-09-16: qty 30

## 2015-09-16 MED ORDER — KETOROLAC TROMETHAMINE 30 MG/ML IJ SOLN
30.0000 mg | Freq: Once | INTRAMUSCULAR | Status: AC
  Administered 2015-09-16: 30 mg via INTRAVENOUS

## 2015-09-16 MED ORDER — MIDAZOLAM HCL 2 MG/2ML IJ SOLN
INTRAMUSCULAR | Status: AC
  Filled 2015-09-16: qty 2

## 2015-09-16 MED ORDER — POVIDONE-IODINE 10 % OINT PACKET
TOPICAL_OINTMENT | CUTANEOUS | Status: DC | PRN
  Administered 2015-09-16: 1 via TOPICAL

## 2015-09-16 MED ORDER — PROPOFOL 10 MG/ML IV BOLUS
INTRAVENOUS | Status: AC
Start: 2015-09-16 — End: 2015-09-16
  Filled 2015-09-16: qty 20

## 2015-09-16 MED ORDER — FENTANYL CITRATE (PF) 100 MCG/2ML IJ SOLN
INTRAMUSCULAR | Status: DC | PRN
  Administered 2015-09-16: 50 ug via INTRAVENOUS
  Administered 2015-09-16 (×2): 25 ug via INTRAVENOUS

## 2015-09-16 MED ORDER — BUPIVACAINE HCL (PF) 0.5 % IJ SOLN
INTRAMUSCULAR | Status: DC | PRN
  Administered 2015-09-16: 10 mL

## 2015-09-16 MED ORDER — FENTANYL CITRATE (PF) 250 MCG/5ML IJ SOLN
INTRAMUSCULAR | Status: AC
  Filled 2015-09-16: qty 5

## 2015-09-16 MED ORDER — CEFAZOLIN SODIUM-DEXTROSE 2-4 GM/100ML-% IV SOLN
2.0000 g | INTRAVENOUS | Status: AC
  Administered 2015-09-16: 2 g via INTRAVENOUS
  Filled 2015-09-16: qty 100

## 2015-09-16 MED ORDER — FENTANYL CITRATE (PF) 100 MCG/2ML IJ SOLN
25.0000 ug | INTRAMUSCULAR | Status: AC
  Administered 2015-09-16: 25 ug via INTRAVENOUS

## 2015-09-16 MED ORDER — FENTANYL CITRATE (PF) 100 MCG/2ML IJ SOLN
25.0000 ug | INTRAMUSCULAR | Status: DC | PRN
  Administered 2015-09-16 (×4): 50 ug via INTRAVENOUS
  Filled 2015-09-16 (×2): qty 2

## 2015-09-16 MED ORDER — LACTATED RINGERS IV SOLN
INTRAVENOUS | Status: DC
  Administered 2015-09-16: 08:00:00 via INTRAVENOUS

## 2015-09-16 MED ORDER — POVIDONE-IODINE 10 % EX OINT
TOPICAL_OINTMENT | CUTANEOUS | Status: AC
  Filled 2015-09-16: qty 1

## 2015-09-16 MED ORDER — PROPOFOL 10 MG/ML IV BOLUS
INTRAVENOUS | Status: DC | PRN
  Administered 2015-09-16: 120 mg via INTRAVENOUS

## 2015-09-16 MED ORDER — SODIUM CHLORIDE 0.9 % IJ SOLN
INTRAMUSCULAR | Status: AC
  Filled 2015-09-16: qty 10

## 2015-09-16 MED ORDER — ONDANSETRON HCL 4 MG/2ML IJ SOLN: 4.0000 mg | Freq: Once | INTRAMUSCULAR | Status: DC | PRN

## 2015-09-16 MED ORDER — LIDOCAINE HCL (PF) 1 % IJ SOLN
INTRAMUSCULAR | Status: AC
  Filled 2015-09-16: qty 5

## 2015-09-16 MED ORDER — LIDOCAINE HCL 1 % IJ SOLN
INTRAMUSCULAR | Status: DC | PRN
  Administered 2015-09-16: 30 mg via INTRADERMAL

## 2015-09-16 SURGICAL SUPPLY — 44 items
BAG HAMPER (MISCELLANEOUS) ×3 IMPLANT
BLADE SURG SZ11 CARB STEEL (BLADE) ×3 IMPLANT
CHLORAPREP W/TINT 26ML (MISCELLANEOUS) ×3 IMPLANT
CLOTH BEACON ORANGE TIMEOUT ST (SAFETY) ×3 IMPLANT
COVER LIGHT HANDLE STERIS (MISCELLANEOUS) ×6 IMPLANT
DECANTER SPIKE VIAL GLASS SM (MISCELLANEOUS) ×2 IMPLANT
ELECT REM PT RETURN 9FT ADLT (ELECTROSURGICAL) ×3
ELECTRODE REM PT RTRN 9FT ADLT (ELECTROSURGICAL) ×1 IMPLANT
GAUZE SPONGE 4X4 12PLY STRL (GAUZE/BANDAGES/DRESSINGS) ×3 IMPLANT
GLOVE BIOGEL PI IND STRL 7.0 (GLOVE) ×1 IMPLANT
GLOVE BIOGEL PI INDICATOR 7.0 (GLOVE) ×2
GLOVE SURG SS PI 7.5 STRL IVOR (GLOVE) ×3 IMPLANT
GOWN STRL REUS W/ TWL LRG LVL3 (GOWN DISPOSABLE) ×1 IMPLANT
GOWN STRL REUS W/ TWL XL LVL3 (GOWN DISPOSABLE) ×1 IMPLANT
GOWN STRL REUS W/TWL LRG LVL3 (GOWN DISPOSABLE) ×3
GOWN STRL REUS W/TWL XL LVL3 (GOWN DISPOSABLE) ×3
INST SET MAJOR GENERAL (KITS) IMPLANT
INST SET MINOR GENERAL (KITS) ×2 IMPLANT
KIT ROOM TURNOVER APOR (KITS) ×3 IMPLANT
LIGASURE IMPACT 36 18CM CVD LR (INSTRUMENTS) ×2 IMPLANT
MANIFOLD NEPTUNE II (INSTRUMENTS) ×3 IMPLANT
NDL HYPO 21X1.5 SAFETY (NEEDLE) ×1 IMPLANT
NEEDLE HYPO 21X1.5 SAFETY (NEEDLE) ×3 IMPLANT
NS IRRIG 1000ML POUR BTL (IV SOLUTION) ×3 IMPLANT
PACK ABDOMINAL MAJOR (CUSTOM PROCEDURE TRAY) IMPLANT
PACK MINOR (CUSTOM PROCEDURE TRAY) ×2 IMPLANT
PAD ARMBOARD 7.5X6 YLW CONV (MISCELLANEOUS) ×3 IMPLANT
SET BASIN LINEN APH (SET/KITS/TRAYS/PACK) ×3 IMPLANT
STAPLER VISISTAT (STAPLE) ×2 IMPLANT
SUT ETHIBOND 0 MO6 C/R (SUTURE) ×2 IMPLANT
SUT NOVA NAB GS-21 1 T12 (SUTURE) IMPLANT
SUT NOVA NAB GS-22 2 2-0 T-19 (SUTURE) IMPLANT
SUT NOVA NAB GS-26 0 60 (SUTURE) IMPLANT
SUT PROLENE 0 CT 1 CR/8 (SUTURE) IMPLANT
SUT SILK 2 0 (SUTURE)
SUT SILK 2-0 18XBRD TIE 12 (SUTURE) IMPLANT
SUT VIC AB 2-0 CT1 27 (SUTURE) ×3
SUT VIC AB 2-0 CT1 TAPERPNT 27 (SUTURE) ×1 IMPLANT
SUT VIC AB 3-0 SH 27 (SUTURE) ×3
SUT VIC AB 3-0 SH 27X BRD (SUTURE) ×1 IMPLANT
SUT VIC AB 4-0 PS2 27 (SUTURE) ×2 IMPLANT
SUT VICRYL AB 2 0 TIES (SUTURE) IMPLANT
SYR 20CC LL (SYRINGE) ×3 IMPLANT
TAPE HYPAFIX 4 X30'CHARGABLE (GAUZE/BANDAGES/DRESSINGS) ×2 IMPLANT

## 2015-09-16 NOTE — Anesthesia Preprocedure Evaluation (Signed)
Anesthesia Evaluation  Patient identified by MRN, date of birth, ID band Patient awake    Reviewed: Allergy & Precautions, H&P , NPO status , Patient's Chart, lab work & pertinent test results  History of Anesthesia Complications Negative for: history of anesthetic complications  Airway Mallampati: III  TM Distance: >3 FB Neck ROM: Full  Mouth opening: Limited Mouth Opening  Dental  (+) Edentulous Upper, Edentulous Lower   Pulmonary sleep apnea ,    breath sounds clear to auscultation       Cardiovascular hypertension, Pt. on medications  Rhythm:Regular Rate:Normal     Neuro/Psych PSYCHIATRIC DISORDERS Anxiety    GI/Hepatic GERD  ,  Endo/Other    Renal/GU Renal disease: stones.     Musculoskeletal   Abdominal   Peds  Hematology   Anesthesia Other Findings   Reproductive/Obstetrics                             Anesthesia Physical Anesthesia Plan  ASA: III  Anesthesia Plan: General   Post-op Pain Management:    Induction: Intravenous  Airway Management Planned: LMA  Additional Equipment:   Intra-op Plan:   Post-operative Plan: Extubation in OR  Informed Consent: I have reviewed the patients History and Physical, chart, labs and discussed the procedure including the risks, benefits and alternatives for the proposed anesthesia with the patient or authorized representative who has indicated his/her understanding and acceptance.     Plan Discussed with:   Anesthesia Plan Comments:         Anesthesia Quick Evaluation

## 2015-09-16 NOTE — Anesthesia Postprocedure Evaluation (Signed)
Anesthesia Post Note  Patient: Joe [name]  Procedure(s) Performed: Procedure(s) (LRB): HERNIA REPAIR INCISIONAL WITH MESH (N/A)  Patient location during evaluation: PACU Anesthesia Type: General Level of consciousness: awake, oriented and patient cooperative Pain management: pain level controlled Vital Signs Assessment: post-procedure vital signs reviewed and stable Respiratory status: spontaneous breathing, nonlabored ventilation and respiratory function stable Cardiovascular status: blood pressure returned to baseline Postop Assessment: no signs of nausea or vomiting Anesthetic complications: no    Last Vitals:  Filed Vitals:   09/16/15 0830 09/16/15 0951  BP: 110/67 142/70  Pulse:  90  Temp:  36.7 C  Resp: 0 11    Last Pain: There were no vitals filed for this visit.               Jesus Poplin J

## 2015-09-16 NOTE — Interval H&P Note (Signed)
History and Physical Interval Note:  09/16/2015 8:06 AM  Joe [name]  has presented today for surgery, with the diagnosis of incisional hernia  The various methods of treatment have been discussed with the patient and family. After consideration of risks, benefits and other options for treatment, the patient has consented to  Procedure(s): HERNIA REPAIR INCISIONAL WITH MESH (N/A) as a surgical intervention .  The patient's history has been reviewed, patient examined, no change in status, stable for surgery.  I have reviewed the patient's chart and labs.  Questions were answered to the patient's satisfaction.     Aviva Signs A

## 2015-09-16 NOTE — Progress Notes (Signed)
Resting quietly. Rates pain 6. More restful. Ginger-ale given to drink. Tolerated well.

## 2015-09-16 NOTE — Anesthesia Procedure Notes (Signed)
Procedure Name: LMA Insertion Date/Time: 09/16/2015 8:40 AM Performed by: Charmaine Downs Pre-anesthesia Checklist: Patient identified, Emergency Drugs available, Suction available and Patient being monitored Patient Re-evaluated:Patient Re-evaluated prior to inductionOxygen Delivery Method: Circle system utilized Preoxygenation: Pre-oxygenation with 100% oxygen Intubation Type: IV induction Ventilation: Mask ventilation without difficulty LMA: LMA inserted LMA Size: 4.0 Grade View: Grade III Number of attempts: 1 Placement Confirmation: breath sounds checked- equal and bilateral and positive ETCO2 Tube secured with: Tape Dental Injury: Teeth and Oropharynx as per pre-operative assessment

## 2015-09-16 NOTE — Transfer of Care (Signed)
Immediate Anesthesia Transfer of Care Note  Patient: Joe [name]  Procedure(s) Performed: Procedure(s): HERNIA REPAIR INCISIONAL WITH MESH (N/A)  Patient Location: PACU  Anesthesia Type:General  Level of Consciousness: awake and patient cooperative  Airway & Oxygen Therapy: Patient Spontanous Breathing and Patient connected to face mask oxygen  Post-op Assessment: Report given to RN, Post -op Vital signs reviewed and stable and Patient moving all extremities  Post vital signs: Reviewed and stable  Last Vitals:  Filed Vitals:   09/16/15 0810 09/16/15 0815  BP: 109/64 113/62  Pulse:    Temp:    Resp: 16 16    Last Pain: There were no vitals filed for this visit.       Complications: No apparent anesthesia complications

## 2015-09-16 NOTE — Op Note (Signed)
Patient:  Joe [name]  DOB:  07-Feb-1951  MRN:  [id number]   Preop Diagnosis:  Incisional hernia  Postop Diagnosis:  Same  Procedure:  Incisional herniorrhaphy with mesh  Surgeon:  Aviva Signs, M.D.  Anes:  Gen.  Indications:  Patient is a 65 year old white male who presents with an incisional hernia. He underwent an umbilical herniorrhaphy with mesh in the past at another facility. The risks and benefits of the procedure including bleeding, infection, mesh use, and the possibility of recurrence of the hernia were fully explained to the patient, who gave informed consent.  Procedure note:  The patient was placed the supine position. After general anesthesia was administered, the abdomen was prepped and draped using the usual sterile technique with ChloraPrep. Surgical site confirmation was performed.  An infraumbilical incision was made through the previous surgical scar. This was taken down to the fascia. The umbilicus was freed away from the underlying fascia. The hernia sac which also contained some mesh was identified. It appeared that the previously placed mesh was dislodged. This was excised along with the hernia sac. A portion of the falciform ligament was also excised. The ultimate defect measured approximately 3-4 cm in its greatest diameter. A large Bard 8 cm ventralax ST patch was then inserted and secured to the fascia using 0 Ethibond interrupted sutures. The overlying fascia was reapproximated transversely using 0 Ethibond interrupted sutures. The subcutaneous layer was reapproximated using 3-0 Vicryl interrupted sutures. 0.5% Sensorcaine was instilled into the surrounding wound. The incision was closed using staples. Betadine ointment and dry sterile dressings were applied.  All tape and needle counts were correct at the end of the procedure. Patient was awakened and transferred to PACU in stable condition.  Complications:  None  EBL:  Minimal  Specimen:  None

## 2015-09-16 NOTE — Progress Notes (Signed)
Awake. C/O postop abd pain. Rates pain 10. Restless. Yelling. Wants something for pain. Reassurance given. Med as noted.

## 2015-09-16 NOTE — Discharge Instructions (Signed)
Open Hernia Repair, Care After °Refer to this sheet in the next few weeks. These instructions provide you with information on caring for yourself after your procedure. Your health care provider may also give you more specific instructions. Your treatment has been planned according to current medical practices, but problems sometimes occur. Call your health care provider if you have any problems or questions after your procedure. °WHAT TO EXPECT AFTER THE PROCEDURE °After your procedure, it is typical to have the following: °· Pain in your abdomen, especially along your incision. You will be given pain medicines to control the pain. °· Constipation. You may be given a stool softener to help prevent this. °HOME CARE INSTRUCTIONS °· Only take over-the-counter or prescription medicines as directed by your health care provider. °· Keep the incision area dry and clean. You may wash the incision area gently with soap and water 48 hours after surgery. Gently blot or dab the incision area dry. Do not take baths, use swimming pools, or use hot tubs for 10 days or until your health care provider approves. °· Change bandages (dressings) as directed by your health care provider. °· Continue your normal diet as directed by your health care provider. Eat plenty of fruits and vegetables to help prevent constipation. °· Drink enough fluids to keep your urine clear or pale yellow. This also helps prevent constipation. °· Do not drive until your health care provider says it is okay. °· Do not lift anything heavier than 10 lb (4.5 kg) or play contact sports for 4 weeks or until your health care provider approves. °· Follow up with your health care provider as directed. Ask your health care provider when to make an appointment to have your stitches (sutures) or staples removed. °SEEK MEDICAL CARE IF: °· You have increased bleeding coming from the incision site. °· You have blood in your stool. °· You have increasing pain in the incision  area. °· You see redness or swelling in the incision area. °· You have fluid (pus) coming from the incision. °· You have a fever. °· You notice a bad smell coming from the incision area or dressing. °SEEK IMMEDIATE MEDICAL CARE IF: °· You develop a rash. °· You have chest pain or shortness of breath. °· You feel lightheaded or feel faint. °  °This information is not intended to replace advice given to you by your health care provider. Make sure you discuss any questions you have with your health care provider. °  °Document Released: 10/08/2004 Document Revised: 04/11/2014 Document Reviewed: 10/31/2012 °Elsevier Interactive Patient Education ©2016 Elsevier Inc. ° °

## 2015-09-17 MED ORDER — 0.9 % SODIUM CHLORIDE (POUR BTL) OPTIME
TOPICAL | Status: DC | PRN
  Administered 2015-09-16: 1000 mL

## 2015-09-18 ENCOUNTER — Encounter (HOSPITAL_COMMUNITY): Payer: Self-pay | Admitting: General Surgery

## 2015-09-25 DIAGNOSIS — I10 Essential (primary) hypertension: Secondary | ICD-10-CM | POA: Diagnosis not present

## 2015-09-25 DIAGNOSIS — E6609 Other obesity due to excess calories: Secondary | ICD-10-CM | POA: Diagnosis not present

## 2015-09-25 DIAGNOSIS — E039 Hypothyroidism, unspecified: Secondary | ICD-10-CM | POA: Diagnosis not present

## 2015-09-25 DIAGNOSIS — R7309 Other abnormal glucose: Secondary | ICD-10-CM | POA: Diagnosis not present

## 2015-09-25 DIAGNOSIS — Z6833 Body mass index (BMI) 33.0-33.9, adult: Secondary | ICD-10-CM | POA: Diagnosis not present

## 2015-09-25 DIAGNOSIS — Z125 Encounter for screening for malignant neoplasm of prostate: Secondary | ICD-10-CM | POA: Diagnosis not present

## 2015-09-25 DIAGNOSIS — Z1389 Encounter for screening for other disorder: Secondary | ICD-10-CM | POA: Diagnosis not present

## 2015-09-25 DIAGNOSIS — Z0001 Encounter for general adult medical examination with abnormal findings: Secondary | ICD-10-CM | POA: Diagnosis not present

## 2015-09-25 DIAGNOSIS — G894 Chronic pain syndrome: Secondary | ICD-10-CM | POA: Diagnosis not present

## 2015-09-25 DIAGNOSIS — E782 Mixed hyperlipidemia: Secondary | ICD-10-CM | POA: Diagnosis not present

## 2015-09-25 DIAGNOSIS — G20 Parkinson's disease: Secondary | ICD-10-CM | POA: Diagnosis not present

## 2015-10-12 DIAGNOSIS — N200 Calculus of kidney: Secondary | ICD-10-CM | POA: Diagnosis not present

## 2015-10-12 DIAGNOSIS — N135 Crossing vessel and stricture of ureter without hydronephrosis: Secondary | ICD-10-CM | POA: Diagnosis not present

## 2015-10-12 DIAGNOSIS — N201 Calculus of ureter: Secondary | ICD-10-CM | POA: Diagnosis not present

## 2015-10-12 DIAGNOSIS — R0789 Other chest pain: Secondary | ICD-10-CM | POA: Diagnosis not present

## 2015-10-12 DIAGNOSIS — K219 Gastro-esophageal reflux disease without esophagitis: Secondary | ICD-10-CM | POA: Diagnosis not present

## 2015-10-12 DIAGNOSIS — R079 Chest pain, unspecified: Secondary | ICD-10-CM | POA: Diagnosis not present

## 2015-10-12 DIAGNOSIS — R0602 Shortness of breath: Secondary | ICD-10-CM | POA: Diagnosis not present

## 2015-10-12 DIAGNOSIS — F329 Major depressive disorder, single episode, unspecified: Secondary | ICD-10-CM | POA: Diagnosis not present

## 2015-10-12 DIAGNOSIS — K8020 Calculus of gallbladder without cholecystitis without obstruction: Secondary | ICD-10-CM | POA: Diagnosis not present

## 2015-10-12 DIAGNOSIS — I10 Essential (primary) hypertension: Secondary | ICD-10-CM | POA: Diagnosis not present

## 2015-10-13 DIAGNOSIS — N201 Calculus of ureter: Secondary | ICD-10-CM | POA: Diagnosis not present

## 2015-10-13 DIAGNOSIS — I10 Essential (primary) hypertension: Secondary | ICD-10-CM | POA: Diagnosis not present

## 2015-10-13 DIAGNOSIS — N23 Unspecified renal colic: Secondary | ICD-10-CM | POA: Diagnosis not present

## 2015-10-13 DIAGNOSIS — K8020 Calculus of gallbladder without cholecystitis without obstruction: Secondary | ICD-10-CM | POA: Diagnosis not present

## 2015-10-13 DIAGNOSIS — N135 Crossing vessel and stricture of ureter without hydronephrosis: Secondary | ICD-10-CM | POA: Diagnosis not present

## 2015-10-13 DIAGNOSIS — K219 Gastro-esophageal reflux disease without esophagitis: Secondary | ICD-10-CM | POA: Diagnosis not present

## 2015-10-15 DIAGNOSIS — K8020 Calculus of gallbladder without cholecystitis without obstruction: Secondary | ICD-10-CM | POA: Diagnosis not present

## 2015-10-15 DIAGNOSIS — F419 Anxiety disorder, unspecified: Secondary | ICD-10-CM | POA: Diagnosis not present

## 2015-10-15 DIAGNOSIS — M545 Low back pain: Secondary | ICD-10-CM | POA: Diagnosis not present

## 2015-10-15 DIAGNOSIS — K219 Gastro-esophageal reflux disease without esophagitis: Secondary | ICD-10-CM | POA: Diagnosis not present

## 2015-10-15 DIAGNOSIS — F418 Other specified anxiety disorders: Secondary | ICD-10-CM | POA: Diagnosis not present

## 2015-10-15 DIAGNOSIS — N201 Calculus of ureter: Secondary | ICD-10-CM | POA: Diagnosis not present

## 2015-10-15 DIAGNOSIS — N135 Crossing vessel and stricture of ureter without hydronephrosis: Secondary | ICD-10-CM | POA: Diagnosis not present

## 2015-10-15 DIAGNOSIS — Z955 Presence of coronary angioplasty implant and graft: Secondary | ICD-10-CM | POA: Diagnosis not present

## 2015-10-15 DIAGNOSIS — I10 Essential (primary) hypertension: Secondary | ICD-10-CM | POA: Diagnosis not present

## 2015-10-15 DIAGNOSIS — F329 Major depressive disorder, single episode, unspecified: Secondary | ICD-10-CM | POA: Diagnosis not present

## 2015-10-15 DIAGNOSIS — G20 Parkinson's disease: Secondary | ICD-10-CM | POA: Diagnosis present

## 2015-10-22 DIAGNOSIS — F411 Generalized anxiety disorder: Secondary | ICD-10-CM | POA: Diagnosis not present

## 2015-10-22 DIAGNOSIS — I10 Essential (primary) hypertension: Secondary | ICD-10-CM | POA: Diagnosis not present

## 2015-10-22 DIAGNOSIS — Z466 Encounter for fitting and adjustment of urinary device: Secondary | ICD-10-CM | POA: Diagnosis not present

## 2015-10-22 DIAGNOSIS — Z8049 Family history of malignant neoplasm of other genital organs: Secondary | ICD-10-CM | POA: Diagnosis not present

## 2015-10-22 DIAGNOSIS — N359 Urethral stricture, unspecified: Secondary | ICD-10-CM | POA: Diagnosis not present

## 2015-10-22 DIAGNOSIS — F329 Major depressive disorder, single episode, unspecified: Secondary | ICD-10-CM | POA: Diagnosis not present

## 2015-10-22 DIAGNOSIS — Z8249 Family history of ischemic heart disease and other diseases of the circulatory system: Secondary | ICD-10-CM | POA: Diagnosis not present

## 2015-10-22 DIAGNOSIS — G20 Parkinson's disease: Secondary | ICD-10-CM | POA: Diagnosis not present

## 2015-10-22 DIAGNOSIS — Z960 Presence of urogenital implants: Secondary | ICD-10-CM | POA: Diagnosis not present

## 2015-10-22 DIAGNOSIS — Z87442 Personal history of urinary calculi: Secondary | ICD-10-CM | POA: Diagnosis not present

## 2015-10-22 DIAGNOSIS — N201 Calculus of ureter: Secondary | ICD-10-CM | POA: Diagnosis not present

## 2015-10-22 DIAGNOSIS — G8929 Other chronic pain: Secondary | ICD-10-CM | POA: Diagnosis not present

## 2015-10-22 DIAGNOSIS — K219 Gastro-esophageal reflux disease without esophagitis: Secondary | ICD-10-CM | POA: Diagnosis not present

## 2015-10-22 DIAGNOSIS — N200 Calculus of kidney: Secondary | ICD-10-CM | POA: Diagnosis not present

## 2015-10-22 DIAGNOSIS — Z79899 Other long term (current) drug therapy: Secondary | ICD-10-CM | POA: Diagnosis not present

## 2015-10-22 DIAGNOSIS — M545 Low back pain: Secondary | ICD-10-CM | POA: Diagnosis not present

## 2015-10-23 DIAGNOSIS — Z6835 Body mass index (BMI) 35.0-35.9, adult: Secondary | ICD-10-CM | POA: Diagnosis not present

## 2015-10-23 DIAGNOSIS — N200 Calculus of kidney: Secondary | ICD-10-CM | POA: Diagnosis not present

## 2015-11-11 DIAGNOSIS — B351 Tinea unguium: Secondary | ICD-10-CM | POA: Diagnosis not present

## 2015-11-11 DIAGNOSIS — M79675 Pain in left toe(s): Secondary | ICD-10-CM | POA: Diagnosis not present

## 2016-01-06 ENCOUNTER — Emergency Department (HOSPITAL_COMMUNITY)
Admission: EM | Admit: 2016-01-06 | Discharge: 2016-01-07 | Disposition: A | Discharge status: Home / Self Care | Payer: Medicare Other | Attending: Emergency Medicine | Admitting: Emergency Medicine

## 2016-01-06 ENCOUNTER — Encounter (HOSPITAL_COMMUNITY): Payer: Self-pay

## 2016-01-06 DIAGNOSIS — Z79899 Other long term (current) drug therapy: Secondary | ICD-10-CM | POA: Insufficient documentation

## 2016-01-06 DIAGNOSIS — R0682 Tachypnea, not elsewhere classified: Secondary | ICD-10-CM | POA: Diagnosis not present

## 2016-01-06 DIAGNOSIS — R0602 Shortness of breath: Secondary | ICD-10-CM | POA: Diagnosis present

## 2016-01-06 DIAGNOSIS — G20 Parkinson's disease: Secondary | ICD-10-CM | POA: Insufficient documentation

## 2016-01-06 DIAGNOSIS — I10 Essential (primary) hypertension: Secondary | ICD-10-CM | POA: Diagnosis not present

## 2016-01-06 DIAGNOSIS — G4730 Sleep apnea, unspecified: Secondary | ICD-10-CM

## 2016-01-06 DIAGNOSIS — R061 Stridor: Secondary | ICD-10-CM | POA: Diagnosis not present

## 2016-01-06 NOTE — ED Triage Notes (Signed)
Pt is resident of [facility] where his roommate called the staff to report pt had stopped breathing while he was asleep. Pt admits to sleep apnea but has not gotten the machine for it yet.

## 2016-01-07 NOTE — ED Provider Notes (Signed)
[facility] DEPT Provider Note   CSN: [id number] Arrival date & time: 01/06/16  2330  By signing my name below, I, Royce Macadamia, attest that this documentation has been prepared under the direction and in the presence of Rolland Porter, MD . Electronically Signed: Royce Macadamia, Scribe. 01/07/2016. 1:45 AM.  Time seen 12:00 AM  History   Chief Complaint Chief Complaint  Patient presents with  . Shortness of Breath   The history is provided by the patient and medical records. No language interpreter was used.     HPI Comments:  MARY GAVRILOV is a 65 y.o. male with a hx of sleep apnea who presents to the Emergency Department complaining that he stopped breathing while sleeping. Pt states he feels fine. His roommate woke him up tonight because he said he stopped breathing.  He has been his roommate for 2- 3 weeks.  Pt adds that his roommate claimed to wake him up 8 times last night, but the pt does not remember.  Pt had a sleep study performed last year that determined he stopped breathing 4-5 times a night and that he needs a CPAP machine, however, pt has never had or used a working machine. He states he has several parts of the machine but not a complete machine. He denies physical symptoms, chest pain, SOB, and feeling tired during the day.    PCP Dr Gerarda Fraction  Past Medical History:  Diagnosis Date  . Anxiety   . Arthritis    osteoarthritis  . Borderline diabetes   . DJD (degenerative joint disease)   . GERD (gastroesophageal reflux disease)   . H/O renal calculi 1992  . Hypertension   . 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 Obesity   . Sleep apnea    STOP BANG; 7;Has study and was positive but have not gotten CPAP yet.    There are no active problems to display for this patient.   Past Surgical History:  Procedure Laterality Date  . APPENDECTOMY  81    mmh  . CATARACT EXTRACTION W/PHACO  12/09/2010   Procedure: CATARACT EXTRACTION PHACO AND INTRAOCULAR LENS  PLACEMENT (IOC);  Surgeon: Tonny Branch;  Location: [facility];  Service: Ophthalmology;  Laterality: Right;  CDE: 36.21  . CATARACT EXTRACTION W/PHACO  10/24/2011   Procedure: CATARACT EXTRACTION PHACO AND INTRAOCULAR LENS PLACEMENT (IOC);  Surgeon: Tonny Branch, MD;  Location: [facility];  Service: Ophthalmology;  Laterality: Left;  CDE 10.35  . COLONOSCOPY  09/20/2011   Procedure: COLONOSCOPY;  Surgeon: Jamesetta So, MD;  Location: [facility];  Service: Gastroenterology;  Laterality: N/A;  . CYSTOSCOPY     with kidney stone removal-mmh  . HERNIA REPAIR     umbilical hernia repair-mmh  . INCISIONAL HERNIA REPAIR N/A 09/16/2015   Procedure: HERNIA REPAIR INCISIONAL WITH MESH;  Surgeon: Aviva Signs, MD;  Location: [facility];  Service: General;  Laterality: N/A;  Umbilicus  . INSERTION OF MESH N/A 09/16/2015   Procedure: INSERTION OF MESH UMBILICUS;  Surgeon: Aviva Signs, MD;  Location: [facility];  Service: General;  Laterality: N/A;       Home Medications    Prior to Admission medications   Medication Sig Start Date End Date Taking? Authorizing Provider  acetaminophen (TYLENOL) 325 MG tablet Take 650 mg by mouth every 6 (six) hours as needed for mild pain or moderate pain.    Historical Provider, MD  ALPRAZolam Duanne Moron) 1 MG tablet Take 1 mg by mouth 3 (three) times daily.  Historical Provider, MD  calcium-vitamin D (OSCAL WITH D) 500-200 MG-UNIT tablet Take 1 tablet by mouth 2 (two) times daily.    Historical Provider, MD  carbidopa-levodopa (SINEMET IR) 25-100 MG per tablet Take 1 tablet by mouth 3 (three) times daily. 11/29/13   Drema Dallas, DO  cholecalciferol (VITAMIN D) 1000 units tablet Take 1,000 Units by mouth daily.    Historical Provider, MD  cyclobenzaprine (FLEXERIL) 10 MG tablet Take 10 mg by mouth every 8 (eight) hours as needed for muscle spasms.    Historical Provider, MD  FLUoxetine (PROZAC) 20 MG capsule Take 20 mg by mouth daily.    Historical Provider, MD  lisinopril  (PRINIVIL,ZESTRIL) 2.5 MG tablet Take 2.5 mg by mouth daily.  12/18/14   Historical Provider, MD  Multiple Vitamin (TAB-A-VITE) TABS Take 1 tablet by mouth daily.    Historical Provider, MD  promethazine (PHENERGAN) 25 MG tablet Take 25 mg by mouth every 6 (six) hours as needed for nausea or vomiting.    Historical Provider, MD  ranitidine (ZANTAC) 150 MG tablet Take 150 mg by mouth daily.     Historical Provider, MD  traMADol-acetaminophen (ULTRACET) 37.5-325 MG tablet Take 1 tablet by mouth every 6 (six) hours as needed for moderate pain or severe pain. 09/16/15   Aviva Signs, MD    Family History Family History  Problem Relation Age of Onset  . Heart disease    . Cancer    . Hypertension    . Anesthesia problems Neg Hx   . Hypotension Neg Hx   . Malignant hyperthermia Neg Hx   . Pseudochol deficiency Neg Hx     Social History Social History  Substance Use Topics  . Smoking status: Never Smoker  . Smokeless tobacco: Never Used  . Alcohol use No  lives in a group home   Allergies   Review of patient's allergies indicates no known allergies.   Review of Systems Review of Systems  Constitutional: Negative for fatigue.       Positive sleep apnea.  Respiratory: Negative for shortness of breath.   Cardiovascular: Negative for chest pain.  All other systems reviewed and are negative.    Physical Exam Updated Vital Signs BP 107/66 (BP Location: Left Arm)   Pulse 69   Temp 97.8 F (36.6 C) (Oral)   Resp 20   Ht 5\' 7"  (1.702 m)   Wt 242 lb (109.8 kg)   SpO2 96%   BMI 37.90 kg/m   Vital signs normal    Physical Exam  Constitutional: He is oriented to person, place, and time. He appears well-developed and well-nourished.  Non-toxic appearance. He does not appear ill. No distress.  HENT:  Head: Normocephalic and atraumatic.  Right Ear: External ear normal.  Left Ear: External ear normal.  Nose: Nose normal. No mucosal edema or rhinorrhea.  Mouth/Throat: Oropharynx  is clear and moist and mucous membranes are normal. No dental abscesses or uvula swelling.  Eyes: Conjunctivae and EOM are normal. Pupils are equal, round, and reactive to light.  Neck: Normal range of motion and full passive range of motion without pain. Neck supple.  Cardiovascular: Normal rate, regular rhythm and normal heart sounds.  Exam reveals no gallop and no friction rub.   No murmur heard. Pulmonary/Chest: Effort normal and breath sounds normal. No respiratory distress. He has no wheezes. He has no rhonchi. He has no rales. He exhibits no tenderness and no crepitus.  Abdominal: Soft. Normal appearance and bowel sounds  are normal. He exhibits no distension. There is no tenderness. There is no rebound and no guarding.  Musculoskeletal: Normal range of motion. He exhibits no edema or tenderness.  Moves all extremities well.   Neurological: He is alert and oriented to person, place, and time. He has normal strength. No cranial nerve deficit.  Skin: Skin is warm, dry and intact. No rash noted. No erythema. No pallor.  Psychiatric: He has a normal mood and affect. His speech is normal and behavior is normal. His mood appears not anxious.  Nursing note and vitals reviewed.    ED Treatments / Results    Procedures Procedures (including critical care time)  Medications Ordered in ED Medications - No data to display   Initial Impression / Assessment and Plan / ED Course  I have reviewed the triage vital signs and the nursing notes.  Pertinent labs & imaging results that were available during my care of the patient were reviewed by me and considered in my medical decision making (see chart for details).  Clinical Course    DIAGNOSTIC STUDIES:  Oxygen Saturation is 96% on RA, NML by my interpretation.    COORDINATION OF CARE:  12:12 AM Discussed treatment plan with pt at bedside and pt agreed to plan.  Patient has known sleep apnea that was diagnosed over a year ago. He has  parts of the C Pap machine but not a complete machine. His group home owner called and spoke to the nurse and states she will help him get a complete machine tomorrow.  Final Clinical Impressions(s) / ED Diagnoses   Final diagnoses:  Sleep apnea, unspecified type    Plan discharge  Rolland Porter, MD, FACEP  I personally performed the services described in this documentation, which was scribed in my presence. The recorded information has been reviewed and considered.  Rolland Porter, MD, Barbette Or, MD 01/07/16 [id number]

## 2016-01-07 NOTE — ED Notes (Signed)
Received call from administrator at [facility] group home calling to check on pt status. Informed her that pt was fine and stable. Administrator reports that she will f/u on necessary parts for Cpap machine as pt is new to this facility. Dr Tomi Bamberger and pt made aware.

## 2016-01-07 NOTE — Discharge Instructions (Signed)
You need to follow up on getting your CPAP machine working tomorrow.  Recheck as needed.

## 2016-01-07 NOTE — ED Notes (Signed)
Pt taken to waiting room via [facility].  Registration aware that pt is a/w ride home.

## 2016-01-07 NOTE — ED Notes (Signed)
[facility] called to inform pt is being discharged and will need transport home.

## 2016-01-14 DIAGNOSIS — Z79899 Other long term (current) drug therapy: Secondary | ICD-10-CM | POA: Diagnosis not present

## 2016-01-18 DIAGNOSIS — E559 Vitamin D deficiency, unspecified: Secondary | ICD-10-CM | POA: Diagnosis not present

## 2016-01-18 DIAGNOSIS — N179 Acute kidney failure, unspecified: Secondary | ICD-10-CM | POA: Diagnosis not present

## 2016-01-18 DIAGNOSIS — I10 Essential (primary) hypertension: Secondary | ICD-10-CM | POA: Diagnosis not present

## 2016-01-18 DIAGNOSIS — F419 Anxiety disorder, unspecified: Secondary | ICD-10-CM | POA: Diagnosis not present

## 2016-01-18 DIAGNOSIS — L600 Ingrowing nail: Secondary | ICD-10-CM | POA: Diagnosis not present

## 2016-01-18 DIAGNOSIS — R000 Tachycardia, unspecified: Secondary | ICD-10-CM | POA: Diagnosis not present

## 2016-01-18 DIAGNOSIS — D529 Folate deficiency anemia, unspecified: Secondary | ICD-10-CM | POA: Diagnosis not present

## 2016-01-18 DIAGNOSIS — K219 Gastro-esophageal reflux disease without esophagitis: Secondary | ICD-10-CM | POA: Diagnosis not present

## 2016-01-19 ENCOUNTER — Telehealth: Payer: Self-pay | Admitting: Neurology

## 2016-01-19 ENCOUNTER — Encounter: Payer: Self-pay | Admitting: Neurology

## 2016-01-19 NOTE — Telephone Encounter (Signed)
Per [facility] pt is no longer a resident there he has been moved to [place] [address]. Their ph# [phone] is disconnected there is no other ph# for this facility.

## 2016-02-06 ENCOUNTER — Emergency Department (HOSPITAL_COMMUNITY): Payer: Medicare Other

## 2016-02-06 ENCOUNTER — Emergency Department (HOSPITAL_COMMUNITY)
Admission: EM | Admit: 2016-02-06 | Discharge: 2016-02-06 | Disposition: A | Discharge status: Home / Self Care | Payer: Medicare Other | Attending: Emergency Medicine | Admitting: Emergency Medicine

## 2016-02-06 ENCOUNTER — Encounter (HOSPITAL_COMMUNITY): Payer: Self-pay

## 2016-02-06 DIAGNOSIS — G20 Parkinson's disease: Secondary | ICD-10-CM | POA: Insufficient documentation

## 2016-02-06 DIAGNOSIS — Z79899 Other long term (current) drug therapy: Secondary | ICD-10-CM | POA: Insufficient documentation

## 2016-02-06 DIAGNOSIS — K2970 Gastritis, unspecified, without bleeding: Secondary | ICD-10-CM | POA: Diagnosis not present

## 2016-02-06 DIAGNOSIS — I10 Essential (primary) hypertension: Secondary | ICD-10-CM | POA: Diagnosis not present

## 2016-02-06 DIAGNOSIS — R110 Nausea: Secondary | ICD-10-CM | POA: Diagnosis not present

## 2016-02-06 DIAGNOSIS — M549 Dorsalgia, unspecified: Secondary | ICD-10-CM | POA: Insufficient documentation

## 2016-02-06 DIAGNOSIS — R109 Unspecified abdominal pain: Secondary | ICD-10-CM | POA: Diagnosis not present

## 2016-02-06 DIAGNOSIS — R52 Pain, unspecified: Secondary | ICD-10-CM

## 2016-02-06 DIAGNOSIS — R51 Headache: Secondary | ICD-10-CM | POA: Diagnosis not present

## 2016-02-06 DIAGNOSIS — R519 Headache, unspecified: Secondary | ICD-10-CM

## 2016-02-06 DIAGNOSIS — R1084 Generalized abdominal pain: Secondary | ICD-10-CM | POA: Diagnosis not present

## 2016-02-06 LAB — COMPREHENSIVE METABOLIC PANEL
ALBUMIN: 4.7 g/dL (ref 3.5–5.0)
ALK PHOS: 39 U/L (ref 38–126)
ALT: 35 U/L (ref 17–63)
ANION GAP: 13 (ref 5–15)
AST: 30 U/L (ref 15–41)
BILIRUBIN TOTAL: 0.6 mg/dL (ref 0.3–1.2)
BUN: 36 mg/dL — ABNORMAL HIGH (ref 6–20)
CALCIUM: 10.2 mg/dL (ref 8.9–10.3)
CO2: 24 mmol/L (ref 22–32)
Chloride: 103 mmol/L (ref 101–111)
Creatinine, Ser: 1.56 mg/dL — ABNORMAL HIGH (ref 0.61–1.24)
GFR, EST AFRICAN AMERICAN: 52 mL/min — AB (ref >60)
GFR, EST NON AFRICAN AMERICAN: 45 mL/min — AB (ref >60)
GLUCOSE: 74 mg/dL (ref 65–99)
Potassium: 4.5 mmol/L (ref 3.5–5.1)
Sodium: 140 mmol/L (ref 135–145)
TOTAL PROTEIN: 7.8 g/dL (ref 6.5–8.1)

## 2016-02-06 LAB — URINALYSIS, ROUTINE W REFLEX MICROSCOPIC
Bilirubin Urine: NEGATIVE
Glucose, UA: NEGATIVE mg/dL
HGB URINE DIPSTICK: NEGATIVE
Ketones, ur: NEGATIVE mg/dL
Leukocytes, UA: NEGATIVE
NITRITE: NEGATIVE
Protein, ur: NEGATIVE mg/dL
SPECIFIC GRAVITY, URINE: 1.01 (ref 1.005–1.030)
pH: 7.5 (ref 5.0–8.0)

## 2016-02-06 LAB — RAPID URINE DRUG SCREEN, HOSP PERFORMED
Amphetamines: NOT DETECTED
BENZODIAZEPINES: POSITIVE — AB
Barbiturates: NOT DETECTED
Cocaine: NOT DETECTED
Opiates: NOT DETECTED
Tetrahydrocannabinol: NOT DETECTED

## 2016-02-06 LAB — CBC WITH DIFFERENTIAL/PLATELET
Basophils Absolute: 0 10*3/uL (ref 0.0–0.1)
Basophils Relative: 0 %
Eosinophils Absolute: 0.2 10*3/uL (ref 0.0–0.7)
Eosinophils Relative: 2 %
HEMATOCRIT: 45.2 % (ref 39.0–52.0)
HEMOGLOBIN: 15.3 g/dL (ref 13.0–17.0)
LYMPHS ABS: 2.7 10*3/uL (ref 0.7–4.0)
LYMPHS PCT: 24 %
MCH: 31.7 pg (ref 26.0–34.0)
MCHC: 33.8 g/dL (ref 30.0–36.0)
MCV: 93.6 fL (ref 78.0–100.0)
MONOS PCT: 10 %
Monocytes Absolute: 1.1 10*3/uL — ABNORMAL HIGH (ref 0.1–1.0)
NEUTROS ABS: 7 10*3/uL (ref 1.7–7.7)
NEUTROS PCT: 64 %
Platelets: 224 10*3/uL (ref 150–400)
RBC: 4.83 MIL/uL (ref 4.22–5.81)
RDW: 13.3 % (ref 11.5–15.5)
WBC: 11 10*3/uL — ABNORMAL HIGH (ref 4.0–10.5)

## 2016-02-06 MED ORDER — LORAZEPAM 2 MG/ML IJ SOLN
1.0000 mg | Freq: Once | INTRAMUSCULAR | Status: AC
  Administered 2016-02-06: 1 mg via INTRAVENOUS
  Filled 2016-02-06: qty 1

## 2016-02-06 MED ORDER — HYDROMORPHONE HCL 1 MG/ML IJ SOLN
1.0000 mg | Freq: Once | INTRAMUSCULAR | Status: AC
  Administered 2016-02-06: 1 mg via INTRAVENOUS
  Filled 2016-02-06: qty 1

## 2016-02-06 MED ORDER — ONDANSETRON HCL 4 MG/2ML IJ SOLN
4.0000 mg | Freq: Once | INTRAMUSCULAR | Status: AC
  Administered 2016-02-06: 4 mg via INTRAVENOUS
  Filled 2016-02-06: qty 2

## 2016-02-06 NOTE — ED Notes (Signed)
Call from [facility] stating they found two bottled of weight loss pills in pt's room. States pt has lost about 7 lbs in a week or so. Call [phone] if questions.

## 2016-02-06 NOTE — ED Triage Notes (Signed)
Pt states pain in abdomen has resolved  Now back and head hurting. Pt very anxious

## 2016-02-06 NOTE — Discharge Instructions (Signed)
Follow-up with your family doctor later this week. Drink plenty of fluids

## 2016-02-06 NOTE — ED Triage Notes (Signed)
Pt came in by EMS from [facility]. Reports nausea that started approx 3 pm with small amt of vomitus. Then approx one hour ago abdomen began hurting. No diarrhea

## 2016-02-08 NOTE — ED Provider Notes (Signed)
[facility] DEPT Provider Note   CSN: [id number] Arrival date & time: 02/06/16  1843     History   Chief Complaint Chief Complaint  Patient presents with  . Abdominal Pain  . Back Pain    HPI Joe [name] is a 65 y.o. male.  Patient complains of a headache and flank pain   The history is provided by the patient. No language interpreter was used.  Abdominal Pain   This is a new problem. The current episode started 2 days ago. The problem occurs constantly. The pain is associated with an unknown factor. Pain location: flank. The quality of the pain is dull. The pain is at a severity of 3/10. Pertinent negatives include anorexia, diarrhea, frequency, hematuria and headaches.  Back Pain   Associated symptoms include abdominal pain. Pertinent negatives include no chest pain and no headaches.    Past Medical History:  Diagnosis Date  . Anxiety   . Arthritis    osteoarthritis  . Borderline diabetes   . DJD (degenerative joint disease)   . GERD (gastroesophageal reflux disease)   . H/O renal calculi 1992  . Hypertension   . 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 Obesity   . Sleep apnea    STOP BANG; 7;Has study and was positive but have not gotten CPAP yet.    There are no active problems to display for this patient.   Past Surgical History:  Procedure Laterality Date  . APPENDECTOMY  81    mmh  . CATARACT EXTRACTION W/PHACO  12/09/2010   Procedure: CATARACT EXTRACTION PHACO AND INTRAOCULAR LENS PLACEMENT (IOC);  Surgeon: Tonny Branch;  Location: [facility];  Service: Ophthalmology;  Laterality: Right;  CDE: 36.21  . CATARACT EXTRACTION W/PHACO  10/24/2011   Procedure: CATARACT EXTRACTION PHACO AND INTRAOCULAR LENS PLACEMENT (IOC);  Surgeon: Tonny Branch, MD;  Location: [facility];  Service: Ophthalmology;  Laterality: Left;  CDE 10.35  . COLONOSCOPY  09/20/2011   Procedure: COLONOSCOPY;  Surgeon: Jamesetta So, MD;  Location: [facility];  Service: Gastroenterology;   Laterality: N/A;  . CYSTOSCOPY     with kidney stone removal-mmh  . HERNIA REPAIR     umbilical hernia repair-mmh  . INCISIONAL HERNIA REPAIR N/A 09/16/2015   Procedure: HERNIA REPAIR INCISIONAL WITH MESH;  Surgeon: Aviva Signs, MD;  Location: [facility];  Service: General;  Laterality: N/A;  Umbilicus  . INSERTION OF MESH N/A 09/16/2015   Procedure: INSERTION OF MESH UMBILICUS;  Surgeon: Aviva Signs, MD;  Location: [facility];  Service: General;  Laterality: N/A;       Home Medications    Prior to Admission medications   Medication Sig Start Date End Date Taking? Authorizing Provider  acetaminophen (TYLENOL) 325 MG tablet Take 650 mg by mouth every 6 (six) hours as needed for mild pain or moderate pain.   Yes Historical Provider, MD  ALPRAZolam Duanne Moron) 1 MG tablet Take 1 mg by mouth 3 (three) times daily.    Yes Historical Provider, MD  amitriptyline (ELAVIL) 10 MG tablet Take 10 mg by mouth at bedtime.   Yes Historical Provider, MD  calcium-vitamin D (OSCAL WITH D) 500-200 MG-UNIT tablet Take 1 tablet by mouth 2 (two) times daily.   Yes Historical Provider, MD  carbidopa-levodopa (SINEMET IR) 25-100 MG per tablet Take 1 tablet by mouth 3 (three) times daily. 11/29/13  Yes Drema Dallas, DO  cholecalciferol (VITAMIN D) 1000 units tablet Take 1,000 Units by mouth daily.   Yes  Historical Provider, MD  cyclobenzaprine (FLEXERIL) 10 MG tablet Take 10 mg by mouth every 8 (eight) hours as needed for muscle spasms.   Yes Historical Provider, MD  FLUoxetine (PROZAC) 20 MG capsule Take 20 mg by mouth daily.   Yes Historical Provider, MD  lisinopril (PRINIVIL,ZESTRIL) 2.5 MG tablet Take 2.5 mg by mouth daily.  12/18/14  Yes Historical Provider, MD  Multiple Vitamin (TAB-A-VITE) TABS Take 1 tablet by mouth daily.   Yes Historical Provider, MD  traMADol-acetaminophen (ULTRACET) 37.5-325 MG tablet Take 1 tablet by mouth every 6 (six) hours as needed for moderate pain or severe pain. 09/16/15  Yes Aviva Signs,  MD  traZODone (DESYREL) 50 MG tablet Take 50 mg by mouth at bedtime.   Yes Historical Provider, MD    Family History Family History  Problem Relation Age of Onset  . Heart disease    . Cancer    . Hypertension    . Anesthesia problems Neg Hx   . Hypotension Neg Hx   . Malignant hyperthermia Neg Hx   . Pseudochol deficiency Neg Hx     Social History Social History  Substance Use Topics  . Smoking status: Never Smoker  . Smokeless tobacco: Never Used  . Alcohol use No     Allergies   Patient has no known allergies.   Review of Systems Review of Systems  Constitutional: Negative for appetite change and fatigue.  HENT: Negative for congestion, ear discharge and sinus pressure.        Headache  Eyes: Negative for discharge.  Respiratory: Negative for cough.   Cardiovascular: Negative for chest pain.  Gastrointestinal: Positive for abdominal pain. Negative for anorexia and diarrhea.  Genitourinary: Negative for frequency and hematuria.  Musculoskeletal: Positive for back pain.  Skin: Negative for rash.  Neurological: Negative for seizures and headaches.  Psychiatric/Behavioral: Negative for hallucinations.     Physical Exam Updated Vital Signs BP 107/65 (BP Location: Right Arm)   Pulse 100   Temp 98 F (36.7 C) (Oral)   Resp 17   Ht 5\' 7"  (1.702 m)   Wt 210 lb (95.3 kg)   SpO2 93%   BMI 32.89 kg/m   Physical Exam  Constitutional: He is oriented to person, place, and time. He appears well-developed.  HENT:  Head: Normocephalic.  Eyes: Conjunctivae and EOM are normal. No scleral icterus.  Neck: Neck supple. No thyromegaly present.  Cardiovascular: Normal rate and regular rhythm.  Exam reveals no gallop and no friction rub.   No murmur heard. Pulmonary/Chest: No stridor. He has no wheezes. He has no rales. He exhibits no tenderness.  Abdominal: He exhibits no distension. There is no tenderness. There is no rebound.  Musculoskeletal: Normal range of motion.  He exhibits no edema.  Lymphadenopathy:    He has no cervical adenopathy.  Neurological: He is oriented to person, place, and time. He exhibits normal muscle tone. Coordination normal.  Skin: No rash noted. No erythema.  Psychiatric: He has a normal mood and affect. His behavior is normal.     ED Treatments / Results  Labs (all labs ordered are listed, but only abnormal results are displayed) Labs Reviewed  CBC WITH DIFFERENTIAL/PLATELET - Abnormal; Notable for the following:       Result Value   WBC 11.0 (*)    Monocytes Absolute 1.1 (*)    All other components within normal limits  COMPREHENSIVE METABOLIC PANEL - Abnormal; Notable for the following:    BUN 36 (*)  Creatinine, Ser 1.56 (*)    GFR calc non Af Amer 45 (*)    GFR calc Af Amer 52 (*)    All other components within normal limits  RAPID URINE DRUG SCREEN, HOSP PERFORMED - Abnormal; Notable for the following:    Benzodiazepines POSITIVE (*)    All other components within normal limits  URINALYSIS, ROUTINE W REFLEX MICROSCOPIC (NOT AT [facility])    EKG  EKG Interpretation None       Radiology Ct Head Wo Contrast  Result Date: 02/06/2016 CLINICAL DATA:  65 year old male with a history of headache EXAM: CT HEAD WITHOUT CONTRAST TECHNIQUE: Contiguous axial images were obtained from the base of the skull through the vertex without intravenous contrast. COMPARISON:  MRI brain 11/18/2013, head CT 11/11/2012 FINDINGS: Brain: No acute intracranial hemorrhage. No midline shift or mass effect. Gray-white differentiation maintained. Unremarkable appearance of the ventricular system. Vascular: Unremarkable. Skull: No acute fracture.  No aggressive bone lesion identified. Sinuses/Orbits: Unremarkable appearance of the orbits. Mastoid air cells clear. No middle ear effusion. No significant sinus disease. Other: None IMPRESSION: No CT evidence of acute intracranial abnormality. Signed, Dulcy Fanny. Earleen Newport, DO [facility] Radiology Electronically Signed   By: Corrie Mckusick D.O.   On: 02/06/2016 20:23   Ct Renal Stone Study  Result Date: 02/06/2016 CLINICAL DATA:  Acute onset of generalized abdominal and back pain. Hematemesis. Initial encounter. EXAM: CT ABDOMEN AND PELVIS WITHOUT CONTRAST TECHNIQUE: Multidetector CT imaging of the abdomen and pelvis was performed following the standard protocol without IV contrast. COMPARISON:  CT of the abdomen and pelvis, and abdominal ultrasound performed 10/12/2015 FINDINGS: Lower chest: The visualized lung bases are grossly clear. The visualized portions of the mediastinum are unremarkable. Hepatobiliary: A few tiny calcified granulomata are noted within the liver. Minimal stones are suggested dependently within the gallbladder. The gallbladder is otherwise unremarkable. The common bile duct remains normal in caliber. Pancreas: The pancreas is within normal limits. Spleen: The spleen is unremarkable in appearance. Adrenals/Urinary Tract: The adrenal glands are unremarkable in appearance. There is no evidence of hydronephrosis. No renal or ureteral stones are identified. Nonspecific perinephric stranding is noted bilaterally. Stomach/Bowel: The stomach is largely filled with solid material, and is unremarkable in appearance. The small bowel is within normal limits. The patient is status post appendectomy. The colon is unremarkable in appearance. Vascular/Lymphatic: Minimal calcification is noted along the distal abdominal aorta and its branches. No retroperitoneal lymphadenopathy is seen. No pelvic sidewall lymphadenopathy is identified. Reproductive: The bladder is moderately distended and grossly unremarkable. The prostate is enlarged, measuring 5.7 cm in transverse dimension. Other: No additional soft tissue abnormalities are seen. Musculoskeletal: No acute osseous abnormalities are identified. Facet disease is noted at the lower lumbar spine. The  visualized musculature is unremarkable in appearance. IMPRESSION: 1. No acute abnormality seen within the abdomen or pelvis. 2. Cholelithiasis.  Gallbladder otherwise unremarkable. 3. Enlarged prostate noted. Electronically Signed   By: Garald Balding M.D.   On: 02/06/2016 20:27    Procedures Procedures (including critical care time)  Medications Ordered in ED Medications  LORazepam (ATIVAN) injection 1 mg (1 mg Intravenous Given 02/06/16 1913)  HYDROmorphone (DILAUDID) injection 1 mg (1 mg Intravenous Given 02/06/16 1912)  ondansetron (ZOFRAN) injection 4 mg (4 mg Intravenous Given 02/06/16 1912)     Initial Impression / Assessment and Plan / ED Course  I have reviewed the triage vital signs and the nursing notes.  Pertinent labs & imaging results  that were available during my care of the patient were reviewed by me and considered in my medical decision making (see chart for details).  Clinical Course    Labs and CT unremarkable. Patient will be treated for discomfort in his back and head and will follow-up with PCP  Final Clinical Impressions(s) / ED Diagnoses   Final diagnoses:  Pain  Headache behind the eyes    New Prescriptions Discharge Medication List as of 02/06/2016  9:21 PM       Milton Ferguson, MD 02/08/16 [phone]

## 2016-02-16 ENCOUNTER — Encounter (HOSPITAL_COMMUNITY): Payer: Self-pay

## 2016-02-16 ENCOUNTER — Emergency Department (HOSPITAL_COMMUNITY)
Admission: EM | Admit: 2016-02-16 | Discharge: 2016-02-16 | Disposition: A | Discharge status: Home / Self Care | Payer: Medicare Other | Attending: Emergency Medicine | Admitting: Emergency Medicine

## 2016-02-16 DIAGNOSIS — I10 Essential (primary) hypertension: Secondary | ICD-10-CM | POA: Diagnosis not present

## 2016-02-16 DIAGNOSIS — M545 Low back pain, unspecified: Secondary | ICD-10-CM

## 2016-02-16 DIAGNOSIS — G20 Parkinson's disease: Secondary | ICD-10-CM | POA: Diagnosis not present

## 2016-02-16 DIAGNOSIS — R402411 Glasgow coma scale score 13-15, in the field [EMT or ambulance]: Secondary | ICD-10-CM | POA: Diagnosis not present

## 2016-02-16 DIAGNOSIS — M5489 Other dorsalgia: Secondary | ICD-10-CM | POA: Diagnosis not present

## 2016-02-16 DIAGNOSIS — Z79899 Other long term (current) drug therapy: Secondary | ICD-10-CM | POA: Insufficient documentation

## 2016-02-16 HISTORY — DX: Unspecified psychosis not due to a substance or known physiological condition: F29

## 2016-02-16 HISTORY — DX: Depression, unspecified: F32.A

## 2016-02-16 HISTORY — DX: Other chronic pain: G89.29

## 2016-02-16 HISTORY — DX: Muscle weakness (generalized): M62.81

## 2016-02-16 HISTORY — DX: Major depressive disorder, single episode, unspecified: F32.9

## 2016-02-16 MED ORDER — KETOROLAC TROMETHAMINE 60 MG/2ML IM SOLN
60.0000 mg | Freq: Once | INTRAMUSCULAR | Status: AC
  Administered 2016-02-16: 60 mg via INTRAMUSCULAR
  Filled 2016-02-16: qty 2

## 2016-02-16 NOTE — Discharge Instructions (Signed)
Use ice on the sore area 3 or 4 times a day.  See your doctor, as needed, for problems.

## 2016-02-16 NOTE — ED Triage Notes (Signed)
Pt reports is a resident at [facility] and has history of back pain.  Reports pain started around 0430 this morning.  Pt took a tramadol.  Later this morning he bent over to tie his shoes and couldn't get comfortable after that.  Denies any problems with bowels or bladder.  EMS says pt has history of kidney stones.

## 2016-02-16 NOTE — ED Provider Notes (Signed)
[facility] DEPT Provider Note   CSN: [id number] Arrival date & time: 02/16/16  1314     History   Chief Complaint Chief Complaint  Patient presents with  . Back Pain    HPI Joe [name] is a 65 y.o. male.  He presents for evaluation of low back pain, which occurred today when he bent over to tie his shoes. The pain started several hours ago. He states he has chronic back pain, but yesterday he was doing well. He denies loss of bowel or bladder function. He denies fever, chills, nausea, vomiting, weakness, change inability to walk or paresthesia. There are no other known modifying factors.    HPI  Past Medical History:  Diagnosis Date  . Anxiety   . Arthritis    osteoarthritis  . Borderline diabetes   . Chronic pain   . Depression   . DJD (degenerative joint disease)   . GERD (gastroesophageal reflux disease)   . H/O renal calculi 1992  . Hypertension   . 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 Muscle weakness   . Obesity   . Psychosis   . Sleep apnea    STOP BANG; 7;Has study and was positive but have not gotten CPAP yet.    There are no active problems to display for this patient.   Past Surgical History:  Procedure Laterality Date  . APPENDECTOMY  81    mmh  . CATARACT EXTRACTION W/PHACO  12/09/2010   Procedure: CATARACT EXTRACTION PHACO AND INTRAOCULAR LENS PLACEMENT (IOC);  Surgeon: Tonny Branch;  Location: [facility];  Service: Ophthalmology;  Laterality: Right;  CDE: 36.21  . CATARACT EXTRACTION W/PHACO  10/24/2011   Procedure: CATARACT EXTRACTION PHACO AND INTRAOCULAR LENS PLACEMENT (IOC);  Surgeon: Tonny Branch, MD;  Location: [facility];  Service: Ophthalmology;  Laterality: Left;  CDE 10.35  . COLONOSCOPY  09/20/2011   Procedure: COLONOSCOPY;  Surgeon: Jamesetta So, MD;  [facility];  Service: Gastroenterology;  Laterality: N/A;  . CYSTOSCOPY     with kidney stone removal-mmh  . HERNIA REPAIR     umbilical hernia repair-mmh  . INCISIONAL  HERNIA REPAIR N/A 09/16/2015   Procedure: HERNIA REPAIR INCISIONAL WITH MESH;  Surgeon: Aviva Signs, MD;  Location: [facility];  Service: General;  Laterality: N/A;  Umbilicus  . INSERTION OF MESH N/A 09/16/2015   Procedure: INSERTION OF MESH UMBILICUS;  Surgeon: Aviva Signs, MD;  Location: [facility];  Service: General;  Laterality: N/A;       Home Medications    Prior to Admission medications   Medication Sig Start Date End Date Taking? Authorizing Provider  acetaminophen (TYLENOL) 325 MG tablet Take 650 mg by mouth every 6 (six) hours as needed for mild pain or moderate pain.   Yes Historical Provider, MD  ALPRAZolam Duanne Moron) 1 MG tablet Take 1 mg by mouth 3 (three) times daily.    Yes Historical Provider, MD  amitriptyline (ELAVIL) 10 MG tablet Take 10 mg by mouth at bedtime.   Yes Historical Provider, MD  calcium-vitamin D (OSCAL WITH D) 500-200 MG-UNIT tablet Take 1 tablet by mouth 2 (two) times daily.   Yes Historical Provider, MD  carbidopa-levodopa (SINEMET IR) 25-100 MG per tablet Take 1 tablet by mouth 3 (three) times daily. 11/29/13  Yes Drema Dallas, DO  cholecalciferol (VITAMIN D) 1000 units tablet Take 1,000 Units by mouth daily.   Yes Historical Provider, MD  cyclobenzaprine (FLEXERIL) 10 MG tablet Take 10 mg by mouth every  8 (eight) hours as needed for muscle spasms.   Yes Historical Provider, MD  famotidine (PEPCID) 20 MG tablet Take 20 mg by mouth daily.   Yes Historical Provider, MD  FLUoxetine (PROZAC) 20 MG capsule Take 20 mg by mouth daily.   Yes Historical Provider, MD  HYDROcodone-acetaminophen (NORCO) 10-325 MG tablet Take 1 tablet by mouth every 6 (six) hours as needed for moderate pain.   Yes Historical Provider, MD  lisinopril (PRINIVIL,ZESTRIL) 2.5 MG tablet Take 2.5 mg by mouth daily.  12/18/14  Yes Historical Provider, MD  Multiple Vitamin (TAB-A-VITE) TABS Take 1 tablet by mouth daily.   Yes Historical Provider, MD  traMADol-acetaminophen (ULTRACET) 37.5-325 MG tablet  Take 1 tablet by mouth every 6 (six) hours as needed for moderate pain or severe pain. Patient taking differently: Take 1-2 tablets by mouth 3 (three) times daily as needed for moderate pain or severe pain.  09/16/15  Yes Aviva Signs, MD  traZODone (DESYREL) 50 MG tablet Take 50 mg by mouth at bedtime.   Yes Historical Provider, MD    Family History Family History  Problem Relation Age of Onset  . Heart disease    . Cancer    . Hypertension    . Anesthesia problems Neg Hx   . Hypotension Neg Hx   . Malignant hyperthermia Neg Hx   . Pseudochol deficiency Neg Hx     Social History Social History  Substance Use Topics  . Smoking status: Never Smoker  . Smokeless tobacco: Never Used  . Alcohol use No     Allergies   Patient has no known allergies.   Review of Systems Review of Systems  All other systems reviewed and are negative.    Physical Exam Updated Vital Signs BP 108/88   Pulse 73   Temp 98.6 F (37 C) (Oral)   Resp 20   Ht 5\' 7"  (1.702 m)   Wt 205 lb (93 kg)   SpO2 100%   BMI 32.11 kg/m   Physical Exam  Constitutional: He is oriented to person, place, and time. He appears well-developed and well-nourished.  HENT:  Head: Normocephalic and atraumatic.  Right Ear: External ear normal.  Left Ear: External ear normal.  Eyes: Conjunctivae and EOM are normal. Pupils are equal, round, and reactive to light.  Neck: Normal range of motion and phonation normal. Neck supple.  Cardiovascular: Normal rate.   Pulmonary/Chest: Effort normal. He exhibits no bony tenderness.  Abdominal: Soft. He exhibits no distension. There is no tenderness.  Musculoskeletal:  Decreased motion in lumbar, secondary to pain. Diffuse lumbar tenderness. Negative straight leg raising, bilaterally.  Neurological: He is alert and oriented to person, place, and time. No cranial nerve deficit or sensory deficit. He exhibits normal muscle tone. Coordination normal.  Skin: Skin is warm, dry and  intact.  Psychiatric: He has a normal mood and affect. His behavior is normal. Judgment and thought content normal.  Nursing note and vitals reviewed.    ED Treatments / Results  Labs (all labs ordered are listed, but only abnormal results are displayed) Labs Reviewed - No data to display  EKG  EKG Interpretation None       Radiology No results found.  Procedures Procedures (including critical care time)  Medications Ordered in ED Medications  ketorolac (TORADOL) injection 60 mg (60 mg Intramuscular Given 02/16/16 1441)     Initial Impression / Assessment and Plan / ED Course  I have reviewed the triage vital signs and the  nursing notes.  Pertinent labs & imaging results that were available during my care of the patient were reviewed by me and considered in my medical decision making (see chart for details).  Clinical Course     Medications  ketorolac (TORADOL) injection 60 mg (60 mg Intramuscular Given 02/16/16 1441)    Patient Vitals for the past 24 hrs:  BP Temp Temp src Pulse Resp SpO2 Height Weight  02/16/16 1503 - - - 73 - 100 % - -  02/16/16 1500 108/88 - - - - - - -  02/16/16 1319 - - - - - - 5\' 7"  (1.702 m) 205 lb (93 kg)  02/16/16 1318 (!) 147/105 98.6 F (37 C) Oral 71 20 99 % - -    4:05 PM Reevaluation with update and discussion. After initial assessment and treatment, an updated evaluation reveals No additional complaints. Findings discussed with patient, all questions answered. Judas Mohammad L    Final Clinical Impressions(s) / ED Diagnoses   Final diagnoses:  Midline low back pain without sciatica, unspecified chronicity    Evaluation consistent with musculoskeletal low back pain. Doubt cauda equina syndrome, lumbar radiculopathy or discitis.  Nursing Notes Reviewed/ Care Coordinated Applicable Imaging Reviewed Interpretation of Laboratory Data incorporated into ED treatment  The patient appears reasonably screened and/or stabilized  for discharge and I doubt any other medical condition or other [facility] requiring further screening, evaluation, or treatment in the ED at this time prior to discharge.  Plan: Home Medications- continue; Home Treatments- rest; return here if the recommended treatment, does not improve the symptoms; Recommended follow up- PCP prn   New Prescriptions New Prescriptions   No medications on file     Daleen Bo, MD 02/16/16 1729

## 2016-02-16 NOTE — ED Notes (Signed)
RN called [facility] [phone] from cell phone to provide D/C transportation for patient. Same number went directly to busy signal when called from all hospital land lines.

## 2016-02-17 ENCOUNTER — Ambulatory Visit: Payer: Medicaid Other | Admitting: Neurology

## 2016-02-18 DIAGNOSIS — Z79899 Other long term (current) drug therapy: Secondary | ICD-10-CM | POA: Diagnosis not present

## 2016-02-18 DIAGNOSIS — E559 Vitamin D deficiency, unspecified: Secondary | ICD-10-CM | POA: Diagnosis not present

## 2016-02-22 DIAGNOSIS — R000 Tachycardia, unspecified: Secondary | ICD-10-CM | POA: Diagnosis not present

## 2016-02-22 DIAGNOSIS — Z23 Encounter for immunization: Secondary | ICD-10-CM | POA: Diagnosis not present

## 2016-02-22 DIAGNOSIS — F419 Anxiety disorder, unspecified: Secondary | ICD-10-CM | POA: Diagnosis not present

## 2016-02-22 DIAGNOSIS — G894 Chronic pain syndrome: Secondary | ICD-10-CM | POA: Diagnosis not present

## 2016-02-22 DIAGNOSIS — N179 Acute kidney failure, unspecified: Secondary | ICD-10-CM | POA: Diagnosis not present

## 2016-02-22 DIAGNOSIS — G4700 Insomnia, unspecified: Secondary | ICD-10-CM | POA: Diagnosis not present

## 2016-02-22 DIAGNOSIS — R609 Edema, unspecified: Secondary | ICD-10-CM | POA: Diagnosis not present

## 2016-02-22 DIAGNOSIS — R0681 Apnea, not elsewhere classified: Secondary | ICD-10-CM | POA: Diagnosis not present

## 2016-03-08 ENCOUNTER — Encounter: Payer: Self-pay | Admitting: Neurology

## 2016-03-08 ENCOUNTER — Ambulatory Visit (INDEPENDENT_AMBULATORY_CARE_PROVIDER_SITE_OTHER): Payer: Medicare Other | Admitting: Neurology

## 2016-03-08 VITALS — BP 128/74 | HR 87 | Resp 20 | Ht 67.0 in | Wt 230.0 lb

## 2016-03-08 DIAGNOSIS — G20 Parkinson's disease: Secondary | ICD-10-CM | POA: Diagnosis not present

## 2016-03-08 NOTE — Progress Notes (Signed)
Subjective:    Patient ID: Joe Joe [name] is a 65 y.o. male.  HPI     Interim history:   Joe Joe [name] is a 65 year old right-handed gentleman with an underlying medical history of degenerative joint disease, hypertension, obesity, anxiety, arthritis, borderline diabetes and renal calculi, who presents for follow-up consultation of his gait disorder and parkinsonism. The patient is accompanied by a caretaker/administrator from his Assisted Living facility today. He resides at [facility] living now. Joe Joe [name] last saw Joe Joe [name] on 06/24/2015, at which time he reported doing okay, had no new complaints, thankfully no recent falls. He was tolerating the Sinemet.  Today, 03/08/2016: He reports doing okay, no recent falls. Moved from [place] to [facility] assisted living. This was almost 3 months ago. Has been using a cane for gait safety. Feels that the Sinemet still working well for Joe Joe [name]. Has had some interim back pain but nothing sustained. Feels that his memory and mood are stable.   Previously:  Joe Joe [name] saw Joe Joe [name] on 12/25/2014, at which time he reported doing ok. He had not fallen, was going to exercise class about 5 times a week. He was trying to drink enough water. He has a remote Hx of kidney stones. He had a sleep study at [facility], but was not on CPAP. On examination, he had stable and mild parkinsonism. Joe Joe [name] suggested he continue with Sinemet 1 pill 3 times a day.   Joe Joe [name] first met Joe Joe [name], at which time he reported feeling stable. He was participating in exercise at the assisted living facility. He had no recent falls. He was trying to drink enough water. He denied any hallucinations or lower extremity swelling. He had gained weight. He reported snoring. Joe Joe [name] suggested we proceed with a sleep study. He canceled the sleep study in the interim. He was taking: Xanax 1 mg 3 times a day as well as Ambien at night. His other medications include fluoxetine 20 mg each morning, lisinopril 2.5 mg each morning, meloxicam 15  mg daily, vitamin D, Ultracet twice daily, carbidopa-levodopa 25-100 milligrams strength one tablet 3 times a day, at 8, 12, and 4 PM. Joe Joe [name] did not change any medications. Joe Joe [name] did worry about his Xanax dose. It was not clear how long he had been on it. He indicated taking it for years.   Joe [name]: This is his first visit with me and he previously had seen Dr. Jim Like and was evaluated by Joe Joe [name] on 11/29/2013, at which time Dr. 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 The patient was on Sinemet treatment and had benefited from it and Dr. Janann Colonel continued his Sinemet 25-100 milligrams strength one pill 3 times a day.     Symptoms go back to last year when he started falling. He was admitted to [facility]. He was diagnosed with parkinsonism during the hospital stay as Joe Joe [name] understand. He's not able to give a concise history. He had noted tremor in his hands. He felt Sinemet was helpful. He has no side effects from it. He denies lightheadedness. One time in the past he fell out of bed because he had a vivid dream. He has 2 daughters, one is an Therapist, sports at [employer] and the other is an Therapist, sports in [place].  His Past Medical History Is Significant For: Past Medical History:  Diagnosis Date  . Anxiety   . Arthritis    osteoarthritis  . Borderline diabetes   . Chronic pain   .  Depression   . DJD (degenerative joint disease)   . GERD (gastroesophageal reflux disease)   . H/O renal calculi 1992  . Hypertension   . 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 Muscle weakness   . Obesity   . Psychosis   . Sleep apnea    STOP BANG; 7;Has study and was positive but have not gotten CPAP yet.    His Past Surgical History Is Significant For: Past Surgical History:  Procedure Laterality Date  . APPENDECTOMY  81    mmh  . CATARACT EXTRACTION W/PHACO  12/09/2010   Procedure: CATARACT EXTRACTION PHACO AND INTRAOCULAR LENS  PLACEMENT (IOC);  Surgeon: Tonny Branch;  Location: [facility];  Service: Ophthalmology;  Laterality: Right;  CDE: 36.21  . CATARACT EXTRACTION W/PHACO  10/24/2011   Procedure: CATARACT EXTRACTION PHACO AND INTRAOCULAR LENS PLACEMENT (IOC);  Surgeon: Tonny Branch, MD;  Location: [facility];  Service: Ophthalmology;  Laterality: Left;  CDE 10.35  . COLONOSCOPY  09/20/2011   Procedure: COLONOSCOPY;  Surgeon: Jamesetta So, MD;  Location: [facility];  Service: Gastroenterology;  Laterality: N/A;  . CYSTOSCOPY     with kidney stone removal-mmh  . HERNIA REPAIR     umbilical hernia repair-mmh  . INCISIONAL HERNIA REPAIR N/A 09/16/2015   Procedure: HERNIA REPAIR INCISIONAL WITH MESH;  Surgeon: Aviva Signs, MD;  Location: [facility];  Service: General;  Laterality: N/A;  Umbilicus  . INSERTION OF MESH N/A 09/16/2015   Procedure: INSERTION OF MESH UMBILICUS;  Surgeon: Aviva Signs, MD;  Location: [facility];  Service: General;  Laterality: N/A;    His Family History Is Significant For: Family History  Problem Relation Age of Onset  . Heart disease    . Cancer    . Hypertension    . Anesthesia problems Neg Hx   . Hypotension Neg Hx   . Malignant hyperthermia Neg Hx   . Pseudochol deficiency Neg Hx     His Social History Is Significant For: Social History   Social History  . Marital status: Divorced    Spouse name: N/A  . Number of children: 2  . Years of education: 12   Occupational History  .  Not Employed  . Disabled     Social History Main Topics  . Smoking status: Never Smoker  . Smokeless tobacco: Never Used  . Alcohol use No  . Drug use: No  . Sexual activity: No   Other Topics Concern  . None   Social History Narrative   Patient lives at [facility].     Patient is disabled.    Patient has a high school education.    Patient is divorced.    Patient has 2 children.    Patient is right handed.    His Allergies Are:  No Known Allergies:   His Current Medications Are:   Outpatient Encounter Prescriptions as of 03/08/2016  Medication Sig  . acetaminophen (TYLENOL) 325 MG tablet Take 650 mg by mouth every 6 (six) hours as needed for mild pain or moderate pain.  Marland Kitchen ALPRAZolam (XANAX) 1 MG tablet Take 1 mg by mouth 3 (three) times daily.   Marland Kitchen amitriptyline (ELAVIL) 10 MG tablet Take 10 mg by mouth at bedtime.  . calcium-vitamin D (OSCAL WITH D) 500-200 MG-UNIT tablet Take 1 tablet by mouth 2 (two) times daily.  . carbidopa-levodopa (SINEMET IR) 25-100 MG per tablet Take 1 tablet by mouth 3 (three) times daily.  . cholecalciferol (VITAMIN D) 1000 units  tablet Take 1,000 Units by mouth daily.  . cyclobenzaprine (FLEXERIL) 10 MG tablet Take 10 mg by mouth every 8 (eight) hours as needed for muscle spasms.  . famotidine (PEPCID) 20 MG tablet Take 20 mg by mouth daily.  Marland Kitchen FLUoxetine (PROZAC) 20 MG capsule Take 20 mg by mouth daily.  Marland Kitchen lisinopril (PRINIVIL,ZESTRIL) 2.5 MG tablet Take 2.5 mg by mouth daily.   . Multiple Vitamin (TAB-A-VITE) TABS Take 1 tablet by mouth daily.  . traMADol-acetaminophen (ULTRACET) 37.5-325 MG tablet Take 1 tablet by mouth every 6 (six) hours as needed for moderate pain or severe pain. (Patient taking differently: Take 1-2 tablets by mouth 3 (three) times daily as needed for moderate pain or severe pain. )  . traZODone (DESYREL) 50 MG tablet Take 50 mg by mouth at bedtime.  . [DISCONTINUED] HYDROcodone-acetaminophen (NORCO) 10-325 MG tablet Take 1 tablet by mouth every 6 (six) hours as needed for moderate pain.   No facility-administered encounter medications on file as of 03/08/2016.   :  Review of Systems:  Out of a complete 14 point review of systems, all are reviewed and negative with the exception of these symptoms as listed below: Review of Systems  Neurological:       Pt presents for follow up for parkinson's disease. Pt denies any new complaints. Pt is taking the sinemet three times daily with no problems.    Objective:   Neurologic Exam  Physical Exam Physical Examination:   Vitals:   03/08/16 1341  BP: 128/74  Pulse: 87  Resp: 20    General Examination: The patient is a very pleasant 65 y.o. male in no acute distress.  HEENT: Normocephalic, atraumatic, pupils are equal, round and reactive to light and accommodation. Extraocular tracking shows mild saccadic breakdown without nystagmus noted. There is no limitation to his gaze. There is minimal decrease in eye blink rate. Hearing is intact. Face is symmetric with minimal facial masking and normal facial sensation. There is no lip, neck or jaw tremor. Neck is very mildly rigid with intact passive ROM. There are no carotid bruits on auscultation. Oropharynx exam reveals moderate mouth dryness, moderate airway crowding secondary to narrow airway entry and thicker soft palate. Tonsils are 1+. He has no oropharyngeal dyskinesias and no sialorrhea.  Chest: is clear to auscultation without wheezing, rhonchi or crackles noted.  Heart: sounds are regular and normal without murmurs, rubs or gallops noted.   Abdomen: is soft, non-tender and non-distended with normal bowel sounds appreciated on auscultation.  Extremities: There is trace edema in the distal lower extremities, around the ankles bilaterally. Pedal pulses are intact.   Skin: is warm and dry with no trophic changes noted. Age-related changes are noted on the skin.   Musculoskeletal: exam reveals no obvious joint deformities, tenderness, joint swelling or erythema.  Neurologically:  Mental status: The patient is awake and alert, paying good  attention. He is able to provide the history, but not well. He is oriented to: person, place, time/date, situation, day of week, month of year and year. His memory, attention, language and knowledge are fair. There is no aphasia, agnosia, apraxia or anomia. There is a mild degree of bradyphrenia. Speech is mildly hypophonic with no dysarthria noted. Mood is congruent  and affect is normal.   Cranial nerves are as described above under HEENT exam. In addition, shoulder shrug is normal with equal shoulder height noted.  Motor exam: Normal bulk, and strength for age is noted. There are no dyskinesias noted.  Tone is mildly rigid with absence of cogwheeling. There is overall mild bradykinesia. There is no drift or rebound.  There is there is no resting. He has a very mild postural and action tremor in both upper extremities, equal. Fine motor skills are mildly impaired in the right upper extremity and minimally impaired in the left upper extremity. Foot agility and foot taps are mildly impaired bilaterally. He stands up with mild difficulty and has to push himself up. Tandem walk is not possible. Romberg shows mild sway. He walks with decreased arm swing bilaterally, more noticeable on the right. He turns in 3 steps. Balance is mildly impaired. He has a cane, single point. Sensory exam is intact to light touch in the upper and lower extremities. Reflexes are 2+ in the upper extremities and trace in the lower extremities. Finger to nose testing is unremarkable and heel to shin is difficult bilaterally.  Assessment and Plan:   In summary, EULOGIO REQUENA is a very pleasant 65 year old male with an underlying medical history of degenerative joint disease, hypertension, obesity, anxiety, arthritis, borderline diabetes and renal calculi, who presents for follow-up consultation of his mild parkinsonism. He has been on Sinemet 1 pill 3 times a day and reported improvement in his tremor and his walking. He continues to take the medication. He has in the interim moved to [facility] assisted living. He has stable findings of mild parkinsonism, perhaps mild lateralization to the left. He has not had any recent falls. He uses a cane for gait safety and Joe Joe [name] agree with this. He is encouraged to continue his walking exercises. He is advised to try to work on weight loss. He has been able to lose  some weight but does report gaining some weight back over Thanksgiving. Joe Joe [name] suggested he continue with Sinemet 1 pill 3 times a day. Joe Joe [name] will see Joe Joe [name] back in 8 months, sooner as needed. Joe Joe [name] answered all their questions today and the patient and his caretaker were in agreement.  Joe Joe [name] spent 25 minutes in total face-to-face time with the patient, more than 50% of which was spent in counseling and coordination of care, reviewing test results, reviewing medication and discussing or reviewing the diagnosis of parkinsonism, its prognosis and treatment options.

## 2016-03-08 NOTE — Patient Instructions (Signed)
Your exam looks stable.  Please continue your walking exercises and stay well hydrated.

## 2016-03-09 DIAGNOSIS — I10 Essential (primary) hypertension: Secondary | ICD-10-CM | POA: Diagnosis not present

## 2016-03-09 DIAGNOSIS — R7309 Other abnormal glucose: Secondary | ICD-10-CM | POA: Diagnosis not present

## 2016-03-09 DIAGNOSIS — G894 Chronic pain syndrome: Secondary | ICD-10-CM | POA: Diagnosis not present

## 2016-03-09 DIAGNOSIS — M1991 Primary osteoarthritis, unspecified site: Secondary | ICD-10-CM | POA: Diagnosis not present

## 2016-03-09 DIAGNOSIS — Z1389 Encounter for screening for other disorder: Secondary | ICD-10-CM | POA: Diagnosis not present

## 2016-03-09 DIAGNOSIS — Z6833 Body mass index (BMI) 33.0-33.9, adult: Secondary | ICD-10-CM | POA: Diagnosis not present

## 2016-03-09 DIAGNOSIS — M159 Polyosteoarthritis, unspecified: Secondary | ICD-10-CM | POA: Diagnosis not present

## 2016-03-09 DIAGNOSIS — E782 Mixed hyperlipidemia: Secondary | ICD-10-CM | POA: Diagnosis not present

## 2016-03-09 DIAGNOSIS — Z23 Encounter for immunization: Secondary | ICD-10-CM | POA: Diagnosis not present

## 2016-03-13 DIAGNOSIS — I10 Essential (primary) hypertension: Secondary | ICD-10-CM | POA: Diagnosis not present

## 2016-03-13 DIAGNOSIS — R2689 Other abnormalities of gait and mobility: Secondary | ICD-10-CM | POA: Diagnosis not present

## 2016-03-13 DIAGNOSIS — E669 Obesity, unspecified: Secondary | ICD-10-CM | POA: Diagnosis not present

## 2016-03-13 DIAGNOSIS — G20 Parkinson's disease: Secondary | ICD-10-CM | POA: Diagnosis not present

## 2016-03-13 DIAGNOSIS — F419 Anxiety disorder, unspecified: Secondary | ICD-10-CM | POA: Diagnosis not present

## 2016-03-13 DIAGNOSIS — Z9181 History of falling: Secondary | ICD-10-CM | POA: Diagnosis not present

## 2016-03-17 DIAGNOSIS — I10 Essential (primary) hypertension: Secondary | ICD-10-CM | POA: Diagnosis not present

## 2016-03-17 DIAGNOSIS — E669 Obesity, unspecified: Secondary | ICD-10-CM | POA: Diagnosis not present

## 2016-03-17 DIAGNOSIS — G20 Parkinson's disease: Secondary | ICD-10-CM | POA: Diagnosis not present

## 2016-03-17 DIAGNOSIS — R2689 Other abnormalities of gait and mobility: Secondary | ICD-10-CM | POA: Diagnosis not present

## 2016-03-17 DIAGNOSIS — F419 Anxiety disorder, unspecified: Secondary | ICD-10-CM | POA: Diagnosis not present

## 2016-03-17 DIAGNOSIS — Z9181 History of falling: Secondary | ICD-10-CM | POA: Diagnosis not present

## 2016-03-19 DIAGNOSIS — G20 Parkinson's disease: Secondary | ICD-10-CM | POA: Diagnosis not present

## 2016-03-19 DIAGNOSIS — R079 Chest pain, unspecified: Secondary | ICD-10-CM | POA: Diagnosis not present

## 2016-03-19 DIAGNOSIS — Z79899 Other long term (current) drug therapy: Secondary | ICD-10-CM | POA: Diagnosis not present

## 2016-03-19 DIAGNOSIS — I10 Essential (primary) hypertension: Secondary | ICD-10-CM | POA: Diagnosis not present

## 2016-03-19 DIAGNOSIS — F419 Anxiety disorder, unspecified: Secondary | ICD-10-CM | POA: Diagnosis not present

## 2016-03-19 DIAGNOSIS — R0789 Other chest pain: Secondary | ICD-10-CM | POA: Diagnosis not present

## 2016-03-21 DIAGNOSIS — Z79899 Other long term (current) drug therapy: Secondary | ICD-10-CM | POA: Diagnosis not present

## 2016-03-21 DIAGNOSIS — G20 Parkinson's disease: Secondary | ICD-10-CM | POA: Diagnosis not present

## 2016-03-21 DIAGNOSIS — R2689 Other abnormalities of gait and mobility: Secondary | ICD-10-CM | POA: Diagnosis not present

## 2016-03-21 DIAGNOSIS — G4700 Insomnia, unspecified: Secondary | ICD-10-CM | POA: Diagnosis not present

## 2016-03-21 DIAGNOSIS — F419 Anxiety disorder, unspecified: Secondary | ICD-10-CM | POA: Diagnosis not present

## 2016-03-21 DIAGNOSIS — I10 Essential (primary) hypertension: Secondary | ICD-10-CM | POA: Diagnosis not present

## 2016-03-21 DIAGNOSIS — W19XXXS Unspecified fall, sequela: Secondary | ICD-10-CM | POA: Diagnosis not present

## 2016-03-21 DIAGNOSIS — E669 Obesity, unspecified: Secondary | ICD-10-CM | POA: Diagnosis not present

## 2016-03-21 DIAGNOSIS — Z9181 History of falling: Secondary | ICD-10-CM | POA: Diagnosis not present

## 2016-03-22 DIAGNOSIS — G20 Parkinson's disease: Secondary | ICD-10-CM | POA: Diagnosis not present

## 2016-03-22 DIAGNOSIS — R2689 Other abnormalities of gait and mobility: Secondary | ICD-10-CM | POA: Diagnosis not present

## 2016-03-22 DIAGNOSIS — Z9181 History of falling: Secondary | ICD-10-CM | POA: Diagnosis not present

## 2016-03-22 DIAGNOSIS — F419 Anxiety disorder, unspecified: Secondary | ICD-10-CM | POA: Diagnosis not present

## 2016-03-22 DIAGNOSIS — I10 Essential (primary) hypertension: Secondary | ICD-10-CM | POA: Diagnosis not present

## 2016-03-22 DIAGNOSIS — E669 Obesity, unspecified: Secondary | ICD-10-CM | POA: Diagnosis not present

## 2016-03-25 DIAGNOSIS — F419 Anxiety disorder, unspecified: Secondary | ICD-10-CM | POA: Diagnosis not present

## 2016-03-25 DIAGNOSIS — I10 Essential (primary) hypertension: Secondary | ICD-10-CM | POA: Diagnosis not present

## 2016-03-25 DIAGNOSIS — Z9181 History of falling: Secondary | ICD-10-CM | POA: Diagnosis not present

## 2016-03-25 DIAGNOSIS — R2689 Other abnormalities of gait and mobility: Secondary | ICD-10-CM | POA: Diagnosis not present

## 2016-03-25 DIAGNOSIS — E669 Obesity, unspecified: Secondary | ICD-10-CM | POA: Diagnosis not present

## 2016-03-25 DIAGNOSIS — G20 Parkinson's disease: Secondary | ICD-10-CM | POA: Diagnosis not present

## 2016-03-29 DIAGNOSIS — Z9181 History of falling: Secondary | ICD-10-CM | POA: Diagnosis not present

## 2016-03-29 DIAGNOSIS — F419 Anxiety disorder, unspecified: Secondary | ICD-10-CM | POA: Diagnosis not present

## 2016-03-29 DIAGNOSIS — I10 Essential (primary) hypertension: Secondary | ICD-10-CM | POA: Diagnosis not present

## 2016-03-29 DIAGNOSIS — G20 Parkinson's disease: Secondary | ICD-10-CM | POA: Diagnosis not present

## 2016-03-29 DIAGNOSIS — R2689 Other abnormalities of gait and mobility: Secondary | ICD-10-CM | POA: Diagnosis not present

## 2016-03-29 DIAGNOSIS — E669 Obesity, unspecified: Secondary | ICD-10-CM | POA: Diagnosis not present

## 2016-03-31 DIAGNOSIS — R2689 Other abnormalities of gait and mobility: Secondary | ICD-10-CM | POA: Diagnosis not present

## 2016-03-31 DIAGNOSIS — G20 Parkinson's disease: Secondary | ICD-10-CM | POA: Diagnosis not present

## 2016-03-31 DIAGNOSIS — Z9181 History of falling: Secondary | ICD-10-CM | POA: Diagnosis not present

## 2016-03-31 DIAGNOSIS — I10 Essential (primary) hypertension: Secondary | ICD-10-CM | POA: Diagnosis not present

## 2016-03-31 DIAGNOSIS — E669 Obesity, unspecified: Secondary | ICD-10-CM | POA: Diagnosis not present

## 2016-03-31 DIAGNOSIS — F419 Anxiety disorder, unspecified: Secondary | ICD-10-CM | POA: Diagnosis not present

## 2016-04-05 DIAGNOSIS — R2689 Other abnormalities of gait and mobility: Secondary | ICD-10-CM | POA: Diagnosis not present

## 2016-04-05 DIAGNOSIS — E669 Obesity, unspecified: Secondary | ICD-10-CM | POA: Diagnosis not present

## 2016-04-05 DIAGNOSIS — Z9181 History of falling: Secondary | ICD-10-CM | POA: Diagnosis not present

## 2016-04-05 DIAGNOSIS — G20 Parkinson's disease: Secondary | ICD-10-CM | POA: Diagnosis not present

## 2016-04-05 DIAGNOSIS — I10 Essential (primary) hypertension: Secondary | ICD-10-CM | POA: Diagnosis not present

## 2016-04-05 DIAGNOSIS — F419 Anxiety disorder, unspecified: Secondary | ICD-10-CM | POA: Diagnosis not present

## 2016-04-07 DIAGNOSIS — Z9181 History of falling: Secondary | ICD-10-CM | POA: Diagnosis not present

## 2016-04-07 DIAGNOSIS — G20 Parkinson's disease: Secondary | ICD-10-CM | POA: Diagnosis not present

## 2016-04-07 DIAGNOSIS — R2689 Other abnormalities of gait and mobility: Secondary | ICD-10-CM | POA: Diagnosis not present

## 2016-04-07 DIAGNOSIS — E669 Obesity, unspecified: Secondary | ICD-10-CM | POA: Diagnosis not present

## 2016-04-07 DIAGNOSIS — I10 Essential (primary) hypertension: Secondary | ICD-10-CM | POA: Diagnosis not present

## 2016-04-07 DIAGNOSIS — F419 Anxiety disorder, unspecified: Secondary | ICD-10-CM | POA: Diagnosis not present

## 2016-04-08 DIAGNOSIS — G20 Parkinson's disease: Secondary | ICD-10-CM | POA: Diagnosis not present

## 2016-04-08 DIAGNOSIS — R2689 Other abnormalities of gait and mobility: Secondary | ICD-10-CM | POA: Diagnosis not present

## 2016-04-08 DIAGNOSIS — F419 Anxiety disorder, unspecified: Secondary | ICD-10-CM | POA: Diagnosis not present

## 2016-04-08 DIAGNOSIS — I10 Essential (primary) hypertension: Secondary | ICD-10-CM | POA: Diagnosis not present

## 2016-04-08 DIAGNOSIS — E669 Obesity, unspecified: Secondary | ICD-10-CM | POA: Diagnosis not present

## 2016-04-08 DIAGNOSIS — Z9181 History of falling: Secondary | ICD-10-CM | POA: Diagnosis not present

## 2016-04-11 DIAGNOSIS — F330 Major depressive disorder, recurrent, mild: Secondary | ICD-10-CM | POA: Diagnosis not present

## 2016-04-11 DIAGNOSIS — G20 Parkinson's disease: Secondary | ICD-10-CM | POA: Diagnosis not present

## 2016-04-11 DIAGNOSIS — G4700 Insomnia, unspecified: Secondary | ICD-10-CM | POA: Diagnosis not present

## 2016-04-11 DIAGNOSIS — F419 Anxiety disorder, unspecified: Secondary | ICD-10-CM | POA: Diagnosis not present

## 2016-04-11 DIAGNOSIS — I10 Essential (primary) hypertension: Secondary | ICD-10-CM | POA: Diagnosis not present

## 2016-04-11 DIAGNOSIS — G4733 Obstructive sleep apnea (adult) (pediatric): Secondary | ICD-10-CM | POA: Diagnosis not present

## 2016-04-11 DIAGNOSIS — Z79891 Long term (current) use of opiate analgesic: Secondary | ICD-10-CM | POA: Diagnosis not present

## 2016-04-11 DIAGNOSIS — M545 Low back pain: Secondary | ICD-10-CM | POA: Diagnosis not present

## 2016-04-12 DIAGNOSIS — I10 Essential (primary) hypertension: Secondary | ICD-10-CM | POA: Diagnosis not present

## 2016-04-12 DIAGNOSIS — E669 Obesity, unspecified: Secondary | ICD-10-CM | POA: Diagnosis not present

## 2016-04-12 DIAGNOSIS — G20 Parkinson's disease: Secondary | ICD-10-CM | POA: Diagnosis not present

## 2016-04-12 DIAGNOSIS — F419 Anxiety disorder, unspecified: Secondary | ICD-10-CM | POA: Diagnosis not present

## 2016-04-12 DIAGNOSIS — Z9181 History of falling: Secondary | ICD-10-CM | POA: Diagnosis not present

## 2016-04-12 DIAGNOSIS — R2689 Other abnormalities of gait and mobility: Secondary | ICD-10-CM | POA: Diagnosis not present

## 2016-04-14 DIAGNOSIS — G20 Parkinson's disease: Secondary | ICD-10-CM | POA: Diagnosis not present

## 2016-04-14 DIAGNOSIS — I10 Essential (primary) hypertension: Secondary | ICD-10-CM | POA: Diagnosis not present

## 2016-04-14 DIAGNOSIS — F419 Anxiety disorder, unspecified: Secondary | ICD-10-CM | POA: Diagnosis not present

## 2016-04-14 DIAGNOSIS — E669 Obesity, unspecified: Secondary | ICD-10-CM | POA: Diagnosis not present

## 2016-04-14 DIAGNOSIS — Z9181 History of falling: Secondary | ICD-10-CM | POA: Diagnosis not present

## 2016-04-14 DIAGNOSIS — R2689 Other abnormalities of gait and mobility: Secondary | ICD-10-CM | POA: Diagnosis not present

## 2016-04-15 DIAGNOSIS — F419 Anxiety disorder, unspecified: Secondary | ICD-10-CM | POA: Diagnosis not present

## 2016-04-15 DIAGNOSIS — I10 Essential (primary) hypertension: Secondary | ICD-10-CM | POA: Diagnosis not present

## 2016-04-15 DIAGNOSIS — E669 Obesity, unspecified: Secondary | ICD-10-CM | POA: Diagnosis not present

## 2016-04-15 DIAGNOSIS — R2689 Other abnormalities of gait and mobility: Secondary | ICD-10-CM | POA: Diagnosis not present

## 2016-04-15 DIAGNOSIS — G20 Parkinson's disease: Secondary | ICD-10-CM | POA: Diagnosis not present

## 2016-04-15 DIAGNOSIS — Z9181 History of falling: Secondary | ICD-10-CM | POA: Diagnosis not present

## 2016-04-18 DIAGNOSIS — R000 Tachycardia, unspecified: Secondary | ICD-10-CM | POA: Diagnosis not present

## 2016-04-18 DIAGNOSIS — G8929 Other chronic pain: Secondary | ICD-10-CM | POA: Diagnosis not present

## 2016-04-18 DIAGNOSIS — E669 Obesity, unspecified: Secondary | ICD-10-CM | POA: Diagnosis not present

## 2016-04-18 DIAGNOSIS — I10 Essential (primary) hypertension: Secondary | ICD-10-CM | POA: Diagnosis not present

## 2016-04-18 DIAGNOSIS — F419 Anxiety disorder, unspecified: Secondary | ICD-10-CM | POA: Diagnosis not present

## 2016-04-18 DIAGNOSIS — G4700 Insomnia, unspecified: Secondary | ICD-10-CM | POA: Diagnosis not present

## 2016-04-25 DIAGNOSIS — Z9181 History of falling: Secondary | ICD-10-CM | POA: Diagnosis not present

## 2016-04-25 DIAGNOSIS — F419 Anxiety disorder, unspecified: Secondary | ICD-10-CM | POA: Diagnosis not present

## 2016-04-25 DIAGNOSIS — I10 Essential (primary) hypertension: Secondary | ICD-10-CM | POA: Diagnosis not present

## 2016-04-25 DIAGNOSIS — R2689 Other abnormalities of gait and mobility: Secondary | ICD-10-CM | POA: Diagnosis not present

## 2016-04-25 DIAGNOSIS — M79676 Pain in unspecified toe(s): Secondary | ICD-10-CM | POA: Diagnosis not present

## 2016-04-25 DIAGNOSIS — E669 Obesity, unspecified: Secondary | ICD-10-CM | POA: Diagnosis not present

## 2016-04-25 DIAGNOSIS — G20 Parkinson's disease: Secondary | ICD-10-CM | POA: Diagnosis not present

## 2016-04-25 DIAGNOSIS — B351 Tinea unguium: Secondary | ICD-10-CM | POA: Diagnosis not present

## 2016-04-28 ENCOUNTER — Ambulatory Visit: Payer: Medicare Other | Attending: Neurology | Admitting: Neurology

## 2016-04-28 DIAGNOSIS — M549 Dorsalgia, unspecified: Secondary | ICD-10-CM | POA: Diagnosis not present

## 2016-04-28 DIAGNOSIS — G4733 Obstructive sleep apnea (adult) (pediatric): Secondary | ICD-10-CM | POA: Insufficient documentation

## 2016-04-28 DIAGNOSIS — F330 Major depressive disorder, recurrent, mild: Secondary | ICD-10-CM | POA: Diagnosis not present

## 2016-04-28 DIAGNOSIS — I10 Essential (primary) hypertension: Secondary | ICD-10-CM | POA: Diagnosis not present

## 2016-04-28 DIAGNOSIS — G20 Parkinson's disease: Secondary | ICD-10-CM | POA: Diagnosis not present

## 2016-04-28 DIAGNOSIS — Z79891 Long term (current) use of opiate analgesic: Secondary | ICD-10-CM | POA: Diagnosis not present

## 2016-04-28 DIAGNOSIS — K219 Gastro-esophageal reflux disease without esophagitis: Secondary | ICD-10-CM | POA: Diagnosis not present

## 2016-04-28 DIAGNOSIS — G8929 Other chronic pain: Secondary | ICD-10-CM | POA: Diagnosis not present

## 2016-04-28 DIAGNOSIS — F419 Anxiety disorder, unspecified: Secondary | ICD-10-CM | POA: Diagnosis not present

## 2016-04-28 DIAGNOSIS — G4700 Insomnia, unspecified: Secondary | ICD-10-CM | POA: Diagnosis not present

## 2016-04-28 DIAGNOSIS — G3183 Dementia with Lewy bodies: Secondary | ICD-10-CM | POA: Diagnosis not present

## 2016-04-28 DIAGNOSIS — M545 Low back pain: Secondary | ICD-10-CM | POA: Diagnosis not present

## 2016-04-29 NOTE — Procedures (Signed)
Teller A. Merlene Laughter, MD     [email or web address]             NOCTURNAL POLYSOMNOGRAPHY   LOCATION: [facility]    Patient Name: Joe [name], Joe [name] Date: 04/28/2016 Gender: Male D.O.B: [birth year] Age (years): 79 Referring Provider: Barton Fanny NP Height (inches): 67 Interpreting Physician: Phillips Odor MD, ABSM Weight (lbs): 227 RPSGT: Peak, Robert BMI: 36 MRN: [id number] Neck Size: 16.50 CLINICAL INFORMATION Sleep Study Type: Split Night CPAP  Indication for sleep study: OSA  Epworth Sleepiness Score: 3  SLEEP STUDY TECHNIQUE As per the AASM Manual for the Scoring of Sleep and Associated Events v2.3 (April 2016) with a hypopnea requiring 4% desaturations.  The channels recorded and monitored were frontal, central and occipital EEG, electrooculogram (EOG), submentalis EMG (chin), nasal and oral airflow, thoracic and abdominal wall motion, anterior tibialis EMG, snore microphone, electrocardiogram, and pulse oximetry. Continuous positive airway pressure (CPAP) was initiated when the patient met split night criteria and was titrated according to treat sleep-disordered breathing.  MEDICATIONS Medications self-administered by patient taken the night of the study : N/A.  Current Outpatient Prescriptions:  .  acetaminophen (TYLENOL) 325 MG tablet, Take 650 mg by mouth every 6 (six) hours as needed for mild pain or moderate pain., Disp: , Rfl:  .  ALPRAZolam (XANAX) 1 MG tablet, Take 1 mg by mouth 3 (three) times daily. , Disp: , Rfl:  .  amitriptyline (ELAVIL) 10 MG tablet, Take 10 mg by mouth at bedtime., Disp: , Rfl:  .  calcium-vitamin D (OSCAL WITH D) 500-200 MG-UNIT tablet, Take 1 tablet by mouth 2 (two) times daily., Disp: , Rfl:  .  carbidopa-levodopa (SINEMET IR) 25-100 MG per tablet, Take 1 tablet by mouth 3 (three) times daily., Disp: 90 tablet, Rfl: 6 .  cholecalciferol (VITAMIN D) 1000 units tablet, Take 1,000 Units by mouth daily.,  Disp: , Rfl:  .  cyclobenzaprine (FLEXERIL) 10 MG tablet, Take 10 mg by mouth every 8 (eight) hours as needed for muscle spasms., Disp: , Rfl:  .  famotidine (PEPCID) 20 MG tablet, Take 20 mg by mouth daily., Disp: , Rfl:  .  FLUoxetine (PROZAC) 20 MG capsule, Take 20 mg by mouth daily., Disp: , Rfl:  .  lisinopril (PRINIVIL,ZESTRIL) 2.5 MG tablet, Take 2.5 mg by mouth daily. , Disp: , Rfl:  .  Multiple Vitamin (TAB-A-VITE) TABS, Take 1 tablet by mouth daily., Disp: , Rfl:  .  traMADol-acetaminophen (ULTRACET) 37.5-325 MG tablet, Take 1 tablet by mouth every 6 (six) hours as needed for moderate pain or severe pain. (Patient taking differently: Take 1-2 tablets by mouth 3 (three) times daily as needed for moderate pain or severe pain. ), Disp: 50 tablet, Rfl: 0 .  traZODone (DESYREL) 50 MG tablet, Take 50 mg by mouth at bedtime., Disp: , Rfl:     RESPIRATORY PARAMETERS Diagnostic  Total AHI (/hr): 62.7 RDI (/hr): 63.9 OA Index (/hr): 40 CA Index (/hr): 1.2 REM AHI (/hr): 61.2 NREM AHI (/hr): 63.0 Supine AHI (/hr): 63.4 Non-supine AHI (/hr): 58.30 Min O2 Sat (%): 78.00 Mean O2 (%): 91.21 Time below 88% (min): 28.0     Titration  Optimal Pressure (cm): 13 AHI at Optimal Pressure (/hr): 15 Min O2 at Optimal Pressure (%): 88.00 Supine % at Optimal (%):   Sleep % at Optimal (%):         The patient had significant central apnea on higher pressures including the optimal.  SLEEP ARCHITECTURE The  recording time for the entire night was 426.6 minutes.  During a baseline period of 165.5 minutes, the patient slept for 145.5 minutes in REM and nonREM, yielding a sleep efficiency of 87.9%. Sleep onset after lights out was 9.7 minutes with a REM latency of 122.5 minutes. The patient spent 3.78% of the night in stage N1 sleep, 79.38% in stage N2 sleep, 0.00% in stage N3 and 16.84% in REM.  During the titration period of 260.0 minutes, the patient slept for 208.0 minutes in REM and nonREM, yielding a  sleep efficiency of 80.0%. Sleep onset after CPAP initiation was 4.6 minutes with a REM latency of 108.0 minutes. The patient spent 10.34% of the night in stage N1 sleep, 42.79% in stage N2 sleep, 0.00% in stage N3 and 46.88% in REM.  CARDIAC DATA The 2 lead EKG demonstrated sinus rhythm. The mean heart rate was 72.80 beats per minute. Other EKG findings include: PVCs. LEG MOVEMENT DATA The total Periodic Limb Movements of Sleep (PLMS) were 0. The PLMS index was 0.00.  IMPRESSIONS - Severe obstructive sleep apnea occurred during the diagnostic portion of the study (AHI = 62.7/hour). An optimal CPAP of 13 is selected.   Delano Metz, MD Diplomate, American Board of Sleep Medicine.

## 2016-05-11 DIAGNOSIS — G4700 Insomnia, unspecified: Secondary | ICD-10-CM | POA: Diagnosis not present

## 2016-05-11 DIAGNOSIS — G20 Parkinson's disease: Secondary | ICD-10-CM | POA: Diagnosis not present

## 2016-05-11 DIAGNOSIS — F419 Anxiety disorder, unspecified: Secondary | ICD-10-CM | POA: Diagnosis not present

## 2016-05-11 DIAGNOSIS — I10 Essential (primary) hypertension: Secondary | ICD-10-CM | POA: Diagnosis not present

## 2016-05-11 DIAGNOSIS — G4733 Obstructive sleep apnea (adult) (pediatric): Secondary | ICD-10-CM | POA: Diagnosis not present

## 2016-05-11 DIAGNOSIS — Z79891 Long term (current) use of opiate analgesic: Secondary | ICD-10-CM | POA: Diagnosis not present

## 2016-05-11 DIAGNOSIS — M545 Low back pain: Secondary | ICD-10-CM | POA: Diagnosis not present

## 2016-05-11 DIAGNOSIS — F330 Major depressive disorder, recurrent, mild: Secondary | ICD-10-CM | POA: Diagnosis not present

## 2016-05-12 DIAGNOSIS — M549 Dorsalgia, unspecified: Secondary | ICD-10-CM | POA: Diagnosis not present

## 2016-05-12 DIAGNOSIS — G8929 Other chronic pain: Secondary | ICD-10-CM | POA: Diagnosis not present

## 2016-05-12 DIAGNOSIS — I10 Essential (primary) hypertension: Secondary | ICD-10-CM | POA: Diagnosis not present

## 2016-05-12 DIAGNOSIS — K219 Gastro-esophageal reflux disease without esophagitis: Secondary | ICD-10-CM | POA: Diagnosis not present

## 2016-05-12 DIAGNOSIS — H6123 Impacted cerumen, bilateral: Secondary | ICD-10-CM | POA: Diagnosis not present

## 2016-05-16 DIAGNOSIS — F329 Major depressive disorder, single episode, unspecified: Secondary | ICD-10-CM | POA: Diagnosis not present

## 2016-05-16 DIAGNOSIS — F419 Anxiety disorder, unspecified: Secondary | ICD-10-CM | POA: Diagnosis not present

## 2016-05-26 DIAGNOSIS — G3183 Dementia with Lewy bodies: Secondary | ICD-10-CM | POA: Diagnosis not present

## 2016-05-26 DIAGNOSIS — M549 Dorsalgia, unspecified: Secondary | ICD-10-CM | POA: Diagnosis not present

## 2016-05-26 DIAGNOSIS — K219 Gastro-esophageal reflux disease without esophagitis: Secondary | ICD-10-CM | POA: Diagnosis not present

## 2016-05-26 DIAGNOSIS — I10 Essential (primary) hypertension: Secondary | ICD-10-CM | POA: Diagnosis not present

## 2016-05-26 DIAGNOSIS — G8929 Other chronic pain: Secondary | ICD-10-CM | POA: Diagnosis not present

## 2016-06-04 DIAGNOSIS — F329 Major depressive disorder, single episode, unspecified: Secondary | ICD-10-CM | POA: Diagnosis not present

## 2016-06-16 DIAGNOSIS — K219 Gastro-esophageal reflux disease without esophagitis: Secondary | ICD-10-CM | POA: Diagnosis not present

## 2016-06-16 DIAGNOSIS — I10 Essential (primary) hypertension: Secondary | ICD-10-CM | POA: Diagnosis not present

## 2016-06-16 DIAGNOSIS — G8929 Other chronic pain: Secondary | ICD-10-CM | POA: Diagnosis not present

## 2016-06-16 DIAGNOSIS — M549 Dorsalgia, unspecified: Secondary | ICD-10-CM | POA: Diagnosis not present

## 2016-06-16 DIAGNOSIS — R51 Headache: Secondary | ICD-10-CM | POA: Diagnosis not present

## 2016-06-17 DIAGNOSIS — F329 Major depressive disorder, single episode, unspecified: Secondary | ICD-10-CM | POA: Diagnosis not present

## 2016-06-17 DIAGNOSIS — F419 Anxiety disorder, unspecified: Secondary | ICD-10-CM | POA: Diagnosis not present

## 2016-07-15 DIAGNOSIS — M25561 Pain in right knee: Secondary | ICD-10-CM | POA: Diagnosis not present

## 2016-07-16 DIAGNOSIS — Z79899 Other long term (current) drug therapy: Secondary | ICD-10-CM | POA: Diagnosis not present

## 2016-07-16 DIAGNOSIS — G8929 Other chronic pain: Secondary | ICD-10-CM | POA: Diagnosis not present

## 2016-07-16 DIAGNOSIS — M25561 Pain in right knee: Secondary | ICD-10-CM | POA: Diagnosis not present

## 2016-07-16 DIAGNOSIS — E669 Obesity, unspecified: Secondary | ICD-10-CM | POA: Diagnosis not present

## 2016-07-16 DIAGNOSIS — I10 Essential (primary) hypertension: Secondary | ICD-10-CM | POA: Diagnosis not present

## 2016-07-16 DIAGNOSIS — F419 Anxiety disorder, unspecified: Secondary | ICD-10-CM | POA: Diagnosis not present

## 2016-07-20 ENCOUNTER — Ambulatory Visit (HOSPITAL_COMMUNITY)
Admission: RE | Admit: 2016-07-20 | Discharge: 2016-07-20 | Disposition: A | Discharge status: Home / Self Care | Payer: Medicare Other | Source: Ambulatory Visit | Attending: Family Medicine | Admitting: Family Medicine

## 2016-07-20 ENCOUNTER — Other Ambulatory Visit (HOSPITAL_COMMUNITY): Payer: Self-pay | Admitting: Family Medicine

## 2016-07-20 DIAGNOSIS — Z6832 Body mass index (BMI) 32.0-32.9, adult: Secondary | ICD-10-CM | POA: Diagnosis not present

## 2016-07-20 DIAGNOSIS — M25569 Pain in unspecified knee: Secondary | ICD-10-CM | POA: Diagnosis not present

## 2016-07-20 DIAGNOSIS — M25561 Pain in right knee: Secondary | ICD-10-CM | POA: Diagnosis not present

## 2016-07-20 DIAGNOSIS — E6609 Other obesity due to excess calories: Secondary | ICD-10-CM | POA: Diagnosis not present

## 2016-07-20 DIAGNOSIS — Z1389 Encounter for screening for other disorder: Secondary | ICD-10-CM | POA: Diagnosis not present

## 2016-07-28 DIAGNOSIS — I10 Essential (primary) hypertension: Secondary | ICD-10-CM | POA: Diagnosis not present

## 2016-07-28 DIAGNOSIS — Z79899 Other long term (current) drug therapy: Secondary | ICD-10-CM | POA: Diagnosis not present

## 2016-07-28 DIAGNOSIS — G8929 Other chronic pain: Secondary | ICD-10-CM | POA: Diagnosis not present

## 2016-07-28 DIAGNOSIS — F419 Anxiety disorder, unspecified: Secondary | ICD-10-CM | POA: Diagnosis not present

## 2016-07-28 DIAGNOSIS — M25561 Pain in right knee: Secondary | ICD-10-CM | POA: Diagnosis not present

## 2016-07-28 DIAGNOSIS — R279 Unspecified lack of coordination: Secondary | ICD-10-CM | POA: Diagnosis not present

## 2016-07-28 DIAGNOSIS — G20 Parkinson's disease: Secondary | ICD-10-CM | POA: Diagnosis not present

## 2016-07-28 DIAGNOSIS — Z743 Need for continuous supervision: Secondary | ICD-10-CM | POA: Diagnosis not present

## 2016-08-05 DIAGNOSIS — M549 Dorsalgia, unspecified: Secondary | ICD-10-CM | POA: Diagnosis not present

## 2016-08-05 DIAGNOSIS — G8929 Other chronic pain: Secondary | ICD-10-CM | POA: Diagnosis not present

## 2016-08-05 DIAGNOSIS — Z9181 History of falling: Secondary | ICD-10-CM | POA: Diagnosis not present

## 2016-08-05 DIAGNOSIS — I10 Essential (primary) hypertension: Secondary | ICD-10-CM | POA: Diagnosis not present

## 2016-08-05 DIAGNOSIS — K219 Gastro-esophageal reflux disease without esophagitis: Secondary | ICD-10-CM | POA: Diagnosis not present

## 2016-08-11 DIAGNOSIS — I10 Essential (primary) hypertension: Secondary | ICD-10-CM | POA: Diagnosis not present

## 2016-08-18 DIAGNOSIS — F419 Anxiety disorder, unspecified: Secondary | ICD-10-CM | POA: Diagnosis not present

## 2016-08-18 DIAGNOSIS — F329 Major depressive disorder, single episode, unspecified: Secondary | ICD-10-CM | POA: Diagnosis not present

## 2016-08-19 DIAGNOSIS — M549 Dorsalgia, unspecified: Secondary | ICD-10-CM | POA: Diagnosis not present

## 2016-08-19 DIAGNOSIS — G8929 Other chronic pain: Secondary | ICD-10-CM | POA: Diagnosis not present

## 2016-08-19 DIAGNOSIS — I10 Essential (primary) hypertension: Secondary | ICD-10-CM | POA: Diagnosis not present

## 2016-08-19 DIAGNOSIS — R51 Headache: Secondary | ICD-10-CM | POA: Diagnosis not present

## 2016-09-14 DIAGNOSIS — F419 Anxiety disorder, unspecified: Secondary | ICD-10-CM | POA: Diagnosis not present

## 2016-09-14 DIAGNOSIS — F329 Major depressive disorder, single episode, unspecified: Secondary | ICD-10-CM | POA: Diagnosis not present

## 2016-09-24 DIAGNOSIS — I10 Essential (primary) hypertension: Secondary | ICD-10-CM | POA: Diagnosis not present

## 2016-09-24 DIAGNOSIS — K219 Gastro-esophageal reflux disease without esophagitis: Secondary | ICD-10-CM | POA: Diagnosis not present

## 2016-09-24 DIAGNOSIS — G4700 Insomnia, unspecified: Secondary | ICD-10-CM | POA: Diagnosis not present

## 2016-09-24 DIAGNOSIS — R51 Headache: Secondary | ICD-10-CM | POA: Diagnosis not present

## 2016-10-18 DIAGNOSIS — G8929 Other chronic pain: Secondary | ICD-10-CM | POA: Diagnosis not present

## 2016-10-18 DIAGNOSIS — Z79899 Other long term (current) drug therapy: Secondary | ICD-10-CM | POA: Diagnosis not present

## 2016-10-18 DIAGNOSIS — M25561 Pain in right knee: Secondary | ICD-10-CM | POA: Diagnosis not present

## 2016-10-18 DIAGNOSIS — G20 Parkinson's disease: Secondary | ICD-10-CM | POA: Diagnosis not present

## 2016-10-18 DIAGNOSIS — F419 Anxiety disorder, unspecified: Secondary | ICD-10-CM | POA: Diagnosis not present

## 2016-10-18 DIAGNOSIS — I10 Essential (primary) hypertension: Secondary | ICD-10-CM | POA: Diagnosis not present

## 2016-10-25 ENCOUNTER — Encounter (HOSPITAL_COMMUNITY): Payer: Self-pay | Admitting: Emergency Medicine

## 2016-10-25 ENCOUNTER — Emergency Department (HOSPITAL_COMMUNITY)
Admission: EM | Admit: 2016-10-25 | Discharge: 2016-10-25 | Disposition: A | Discharge status: Home / Self Care | Payer: Medicare Other | Attending: Emergency Medicine | Admitting: Emergency Medicine

## 2016-10-25 ENCOUNTER — Emergency Department (HOSPITAL_COMMUNITY): Payer: Medicare Other

## 2016-10-25 DIAGNOSIS — R52 Pain, unspecified: Secondary | ICD-10-CM | POA: Diagnosis not present

## 2016-10-25 DIAGNOSIS — I10 Essential (primary) hypertension: Secondary | ICD-10-CM | POA: Diagnosis not present

## 2016-10-25 DIAGNOSIS — G20 Parkinson's disease: Secondary | ICD-10-CM | POA: Insufficient documentation

## 2016-10-25 DIAGNOSIS — Z79899 Other long term (current) drug therapy: Secondary | ICD-10-CM | POA: Diagnosis not present

## 2016-10-25 DIAGNOSIS — M25561 Pain in right knee: Secondary | ICD-10-CM | POA: Diagnosis not present

## 2016-10-25 LAB — GLUCOSE, PLEURAL OR PERITONEAL FLUID: Glucose, Fluid: 114 mg/dL

## 2016-10-25 LAB — GRAM STAIN: Gram Stain: NONE SEEN

## 2016-10-25 LAB — SYNOVIAL CELL COUNT + DIFF, W/ CRYSTALS
Crystals, Fluid: NONE SEEN
Eosinophils-Synovial: 0 % (ref 0–1)
LYMPHOCYTES-SYNOVIAL FLD: 3 % (ref 0–20)
Monocyte-Macrophage-Synovial Fluid: 0 % — ABNORMAL LOW (ref 50–90)
Neutrophil, Synovial: 2 % (ref 0–25)
Other Cells-SYN: 0

## 2016-10-25 LAB — URIC ACID: Uric Acid, Serum: 8.8 mg/dL — ABNORMAL HIGH (ref 4.4–7.6)

## 2016-10-25 LAB — CBG MONITORING, ED: GLUCOSE-CAPILLARY: 116 mg/dL — AB (ref 65–99)

## 2016-10-25 MED ORDER — LIDOCAINE HCL (PF) 1 % IJ SOLN
5.0000 mL | Freq: Once | INTRAMUSCULAR | Status: DC
  Filled 2016-10-25: qty 5

## 2016-10-25 MED ORDER — OXYCODONE-ACETAMINOPHEN 5-325 MG PO TABS: 1.0000 | ORAL_TABLET | ORAL | 0 refills | Status: DC | PRN

## 2016-10-25 MED ORDER — PREDNISONE 20 MG PO TABS: ORAL_TABLET | ORAL | 0 refills | Status: DC

## 2016-10-25 MED ORDER — POVIDONE-IODINE 10 % EX SOLN
CUTANEOUS | Status: AC
  Filled 2016-10-25: qty 30

## 2016-10-25 MED ORDER — LORAZEPAM 2 MG/ML IJ SOLN
1.0000 mg | Freq: Once | INTRAMUSCULAR | Status: AC
  Administered 2016-10-25: 1 mg via INTRAVENOUS
  Filled 2016-10-25: qty 1

## 2016-10-25 MED ORDER — HYDROMORPHONE HCL 1 MG/ML IJ SOLN
1.0000 mg | Freq: Once | INTRAMUSCULAR | Status: AC
  Administered 2016-10-25: 1 mg via INTRAVENOUS
  Filled 2016-10-25: qty 1

## 2016-10-25 MED ORDER — FENTANYL CITRATE (PF) 100 MCG/2ML IJ SOLN
100.0000 ug | Freq: Once | INTRAMUSCULAR | Status: AC
  Administered 2016-10-25: 100 ug via INTRAVENOUS
  Filled 2016-10-25: qty 2

## 2016-10-25 MED ORDER — KETOROLAC TROMETHAMINE 30 MG/ML IJ SOLN
30.0000 mg | Freq: Once | INTRAMUSCULAR | Status: AC
  Administered 2016-10-25: 30 mg via INTRAVENOUS
  Filled 2016-10-25: qty 1

## 2016-10-25 NOTE — ED Triage Notes (Signed)
Takes tramadol with no relief

## 2016-10-25 NOTE — Discharge Instructions (Signed)
Wear the knee brace as needed.  Be sure to arrange a follow-up appt with an orthopedic.  Return here for any worsening symptoms

## 2016-10-25 NOTE — ED Provider Notes (Signed)
[facility] DEPT Provider Note   CSN: [id number] Arrival date & time: 10/25/16  1030     History   Chief Complaint Chief Complaint  Patient presents with  . Knee Pain    HPI Joe [name] is a 66 y.o. male.  HPI   Joe [name] is a 66 y.o. male who presents to the Emergency Department complaining of worsening of his chronic right knee pain.  He reports hx of knee pain since an injury to the knee at age 43.  Reports waxing and waning of pain, but constant and more severe for 2 days.  Describes a sharp throbbing pain the radiates up the sides of his knee and associated with weight bearing.  He takes tramadol for back pain, but does not relieve the knee pain.  He has not seen an orthopedist for this.  He denies numbness, swelling, redness, fever and chills.     Past Medical History:  Diagnosis Date  . Anxiety   . Arthritis    osteoarthritis  . Borderline diabetes   . Chronic pain   . Depression   . DJD (degenerative joint disease)   . GERD (gastroesophageal reflux disease)   . H/O renal calculi 1992  . Hypertension   . 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 Muscle weakness   . Obesity   . Psychosis   . Sleep apnea    STOP BANG; 7;Has study and was positive but have not gotten CPAP yet.    There are no active problems to display for this patient.   Past Surgical History:  Procedure Laterality Date  . APPENDECTOMY  81    mmh  . CATARACT EXTRACTION W/PHACO  12/09/2010   Procedure: CATARACT EXTRACTION PHACO AND INTRAOCULAR LENS PLACEMENT (IOC);  Surgeon: Tonny Branch;  Location: [facility];  Service: Ophthalmology;  Laterality: Right;  CDE: 36.21  . CATARACT EXTRACTION W/PHACO  10/24/2011   Procedure: CATARACT EXTRACTION PHACO AND INTRAOCULAR LENS PLACEMENT (IOC);  Surgeon: Tonny Branch, MD;  Location: [facility];  Service: Ophthalmology;  Laterality: Left;  CDE 10.35  . COLONOSCOPY  09/20/2011   Procedure: COLONOSCOPY;  Surgeon: Jamesetta So, MD;  Location: [facility];   Service: Gastroenterology;  Laterality: N/A;  . CYSTOSCOPY     with kidney stone removal-mmh  . HERNIA REPAIR     umbilical hernia repair-mmh  . INCISIONAL HERNIA REPAIR N/A 09/16/2015   Procedure: HERNIA REPAIR INCISIONAL WITH MESH;  Surgeon: Aviva Signs, MD;  Location: [facility];  Service: General;  Laterality: N/A;  Umbilicus  . INSERTION OF MESH N/A 09/16/2015   Procedure: INSERTION OF MESH UMBILICUS;  Surgeon: Aviva Signs, MD;  Location: [facility];  Service: General;  Laterality: N/A;       Home Medications    Prior to Admission medications   Medication Sig Start Date End Date Taking? Authorizing Provider  acetaminophen (TYLENOL) 325 MG tablet Take 650 mg by mouth every 6 (six) hours as needed for mild pain or moderate pain.    [provider]  ALPRAZolam Duanne Moron) 1 MG tablet Take 1 mg by mouth 3 (three) times daily.     [provider]  amitriptyline (ELAVIL) 10 MG tablet Take 10 mg by mouth at bedtime.    [provider]  calcium-vitamin D (OSCAL WITH D) 500-200 MG-UNIT tablet Take 1 tablet by mouth 2 (two) times daily.    [provider]  carbidopa-levodopa (SINEMET IR) 25-100 MG per tablet Take 1 tablet by mouth 3 (  three) times daily. 11/29/13   Drema Dallas, DO  cholecalciferol (VITAMIN D) 1000 units tablet Take 1,000 Units by mouth daily.    [provider]  cyclobenzaprine (FLEXERIL) 10 MG tablet Take 10 mg by mouth every 8 (eight) hours as needed for muscle spasms.    [provider]  famotidine (PEPCID) 20 MG tablet Take 20 mg by mouth daily.    [provider]  FLUoxetine (PROZAC) 20 MG capsule Take 20 mg by mouth daily.    [provider]  lisinopril (PRINIVIL,ZESTRIL) 2.5 MG tablet Take 2.5 mg by mouth daily.  12/18/14   [provider]  Multiple Vitamin (TAB-A-VITE) TABS Take 1 tablet by mouth daily.    [provider]  traMADol-acetaminophen (ULTRACET) 37.5-325 MG tablet Take 1 tablet  by mouth every 6 (six) hours as needed for moderate pain or severe pain. Patient taking differently: Take 1-2 tablets by mouth 3 (three) times daily as needed for moderate pain or severe pain.  09/16/15   Aviva Signs, MD  traZODone (DESYREL) 50 MG tablet Take 50 mg by mouth at bedtime.    [provider]    Family History Family History  Problem Relation Age of Onset  . Heart disease Unknown   . Cancer Unknown   . Hypertension Unknown   . Anesthesia problems Neg Hx   . Hypotension Neg Hx   . Malignant hyperthermia Neg Hx   . Pseudochol deficiency Neg Hx     Social History Social History  Substance Use Topics  . Smoking status: Never Smoker  . Smokeless tobacco: Never Used  . Alcohol use No     Allergies   Patient has no known allergies.   Review of Systems Review of Systems  Constitutional: Negative for chills and fever.  Genitourinary: Negative for difficulty urinating and dysuria.  Musculoskeletal: Positive for arthralgias. Negative for joint swelling.  Skin: Negative for color change and wound.  Neurological: Negative for weakness and numbness.  All other systems reviewed and are negative.    Physical Exam Updated Vital Signs BP 138/70   Pulse 85   Temp 99.6 F (37.6 C) (Oral)   Resp (!) 21   SpO2 98%   Physical Exam  Constitutional: He is oriented to person, place, and time. He appears well-developed and well-nourished. No distress.  Pt appears anxious   Cardiovascular: Normal rate, regular rhythm and intact distal pulses.   Pulmonary/Chest: Effort normal and breath sounds normal.  Musculoskeletal: He exhibits tenderness. He exhibits no edema or deformity.  Diffuse ttp of the anterior knee.  No erythema, effusion, or step-off deformity. Pt is not very cooperative for exam due to level of pain.   Will not flex the knee. Calf is soft and NT.  Neurological: He is alert and oriented to person, place, and time. No sensory deficit. He exhibits normal  muscle tone. Coordination normal.  Skin: Skin is warm and dry. Capillary refill takes less than 2 seconds. No erythema.  Nursing note and vitals reviewed.    ED Treatments / Results  Labs (all labs ordered are listed, but only abnormal results are displayed) Labs Reviewed  URIC ACID - Abnormal; Notable for the following:       Result Value   Uric Acid, Serum 8.8 (*)    All other components within normal limits  SYNOVIAL CELL COUNT + DIFF, W/ CRYSTALS - Abnormal; Notable for the following:    Appearance-Synovial CLOUDY (*)    Monocyte-Macrophage-Synovial Fluid 0 (*)  All other components within normal limits  CBG MONITORING, ED - Abnormal; Notable for the following:    Glucose-Capillary 116 (*)    All other components within normal limits  GRAM STAIN  GLUCOSE, PLEURAL OR PERITONEAL FLUID  GLUCOSE, SYNOVIAL FLUID    EKG  EKG Interpretation None       Radiology Dg Knee Complete 4 Views Right  Result Date: 10/25/2016 CLINICAL DATA:  Severe right knee pain.  No recent injury. EXAM: RIGHT KNEE - COMPLETE 4+ VIEW COMPARISON:  07/20/2016 . FINDINGS: Right infrapatellar pretibial soft tissue swelling noted. Corticated bony density is noted along the anterior tibial tuberosity. This is most likely old. No acute bony abnormality identified. No acute fracture. No dislocation. IMPRESSION: Infrapatellar pretibial soft tissue swelling. No associated acute bony abnormality . Electronically Signed   By: Marcello Moores  Register   On: 10/25/2016 12:13    Procedures Procedures (including critical care time)  Medications Ordered in ED Medications  lidocaine (PF) (XYLOCAINE) 1 % injection 5 mL (not administered)  povidone-iodine (BETADINE) 10 % external solution (not administered)  HYDROmorphone (DILAUDID) injection 1 mg (1 mg Intravenous Given 10/25/16 1131)  ketorolac (TORADOL) 30 MG/ML injection 30 mg (30 mg Intravenous Given 10/25/16 1329)  LORazepam (ATIVAN) injection 1 mg (1 mg Intravenous  Given 10/25/16 1329)  fentaNYL (SUBLIMAZE) injection 100 mcg (100 mcg Intravenous Given 10/25/16 1329)     Initial Impression / Assessment and Plan / ED Course  I have reviewed the triage vital signs and the nursing notes.  Pertinent labs & imaging results that were available during my care of the patient were reviewed by me and considered in my medical decision making (see chart for details).  Clinical Course as of Oct 25 1699  Tue Oct 25, 2016  [place] Call received from lab.  Specimen obtained from the aspiration is not enough for complete analysis.  They anticipate doing Gram stain, crystal study and cell count differential.  [EW]    Clinical Course User Index [EW] Daleen Bo, MD   Pt also seen by Dr. Eulis Foster and aspiration preformed by him.  See his note for procedure.  Labs reviewed by me.  Pt is feeling better and states he is ready for d/c.  Knee immobilizer applied.    Pt has appt with his PCP later this week and prefers to f/u with Dr. Alphonzo Cruise in [place].  States his PCP is going to give him an orthopedic referral.    No concerning sx's for septic joint.  Will prescribe prednisone and short course of Percocet.  Appears stable for d/c  Final Clinical Impressions(s) / ED Diagnoses   Final diagnoses:  Acute pain of right knee    New Prescriptions New Prescriptions   No medications on file     Bufford Lope 10/25/16 2016    Daleen Bo, MD 10/27/16 [phone]

## 2016-10-25 NOTE — ED Provider Notes (Signed)
  Face-to-face evaluation   History: Recurrent right knee pain, exquisite, unable to walk or move without causing severe pain.  Evaluate for same 1 week ago at an outlying ED.  Physical exam: Obese alert elderly man.  He is uncomfortable.  Right knee with small effusion, and tender pretibial bursa.  No significant erythema.  Unable to flex knee because of severe pain.  Was an anterior knee tenderness with light touch.  .Joint Aspiration/Arthrocentesis Date/Time: 10/25/2016 2:46 PM Performed by: Daleen Bo Authorized by: Daleen Bo   Consent:    Consent obtained:  Verbal   Consent given by:  Patient   Risks discussed:  Bleeding, pain and incomplete drainage   Alternatives discussed:  No treatment Location:    Location:  Knee Anesthesia (see MAR for exact dosages):    Anesthesia method:  Local infiltration   Local anesthetic:  Lidocaine 1% w/o epi Procedure details:    Preparation: Patient was prepped and draped in usual sterile fashion     Needle gauge:  18 G   Ultrasound guidance: no     Approach:  Medial   Aspirate characteristics:  Clear and blood-tinged   Steroid injected: no     Specimen collected: yes   Post-procedure details:    Dressing:  Adhesive bandage   Patient tolerance of procedure:  Tolerated well, no immediate complications Comments:     Fluid obtained, somewhat gelatinous consistent with normal appearing knee joint fluid, slightly blood tinged, indicating traumatic aspiration    Medical screening examination/treatment/procedure(s) were conducted as a shared visit with non-physician practitioner(s) and myself.  I personally evaluated the patient during the encounter    Daleen Bo, MD 10/27/16 [phone]

## 2016-10-25 NOTE — ED Triage Notes (Addendum)
Called ems for right knee pain. Also has some anxiety issues which he took 0.5 ativan pta. Pt states cant take the pain anymore. Pt was given 51mcg at 1015 of fentanyl iv by ems. Another 51mcg Fentanyl at 1025

## 2016-10-27 DIAGNOSIS — M1711 Unilateral primary osteoarthritis, right knee: Secondary | ICD-10-CM | POA: Diagnosis not present

## 2016-10-27 DIAGNOSIS — M25561 Pain in right knee: Secondary | ICD-10-CM | POA: Diagnosis not present

## 2016-10-27 DIAGNOSIS — Z6837 Body mass index (BMI) 37.0-37.9, adult: Secondary | ICD-10-CM | POA: Diagnosis not present

## 2016-10-27 DIAGNOSIS — Z1389 Encounter for screening for other disorder: Secondary | ICD-10-CM | POA: Diagnosis not present

## 2016-11-08 ENCOUNTER — Ambulatory Visit: Payer: Medicare Other | Admitting: Neurology

## 2016-11-09 ENCOUNTER — Ambulatory Visit: Payer: Medicare Other | Admitting: Orthopaedic Surgery

## 2016-11-15 ENCOUNTER — Encounter: Payer: Self-pay | Admitting: Orthopaedic Surgery

## 2016-11-15 ENCOUNTER — Ambulatory Visit (INDEPENDENT_AMBULATORY_CARE_PROVIDER_SITE_OTHER): Payer: Medicare Other | Admitting: Orthopaedic Surgery

## 2016-11-15 VITALS — BP 146/88 | HR 106 | Temp 97.7°F | Ht 68.0 in | Wt 259.0 lb

## 2016-11-15 DIAGNOSIS — M25561 Pain in right knee: Secondary | ICD-10-CM

## 2016-11-15 DIAGNOSIS — G8929 Other chronic pain: Secondary | ICD-10-CM | POA: Diagnosis not present

## 2016-11-15 NOTE — Progress Notes (Signed)
Subjective:    Patient ID: Joe [name], male    DOB: 03/09/1951, 66 y.o.   MRN: [id number]  HPI He has long history of right knee pain from old injury.  He has bony prominence of the proximal tibia.  He has had prepatella tendon swelling of the area in the past.  He was seen by Dr. Hilma Favors and referred here.  X-rays showed infrapatella pretibial soft tissue swelling and DJD changes.  Before his appointment here, his knee got worse.  He went to the ER on 10-25-16.  They took x-rays and aspirated the area.  He has had no pain since.  He kept the appointment here.  He is doing well now.  He has no pain.  He is walking well.   Review of Systems  HENT: Negative for congestion.   Respiratory: Positive for shortness of breath. Negative for cough.   Cardiovascular: Negative for chest pain and leg swelling.  Endocrine: Negative for cold intolerance.  Musculoskeletal: Positive for gait problem and joint swelling.  Allergic/Immunologic: Positive for environmental allergies.  Psychiatric/Behavioral: The patient is nervous/anxious.    Past Medical History:  Diagnosis Date  . Anxiety   . Arthritis    osteoarthritis  . Borderline diabetes   . Chronic pain   . Depression   . DJD (degenerative joint disease)   . GERD (gastroesophageal reflux disease)   . H/O renal calculi 1992  . Hypertension   . 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 Muscle weakness   . Obesity   . Psychosis   . Sleep apnea    STOP BANG; 7;Has study and was positive but have not gotten CPAP yet.    Past Surgical History:  Procedure Laterality Date  . APPENDECTOMY  81    mmh  . CATARACT EXTRACTION W/PHACO  12/09/2010   Procedure: CATARACT EXTRACTION PHACO AND INTRAOCULAR LENS PLACEMENT (IOC);  Surgeon: Tonny Branch;  Location: [facility];  Service: Ophthalmology;  Laterality: Right;  CDE: 36.21  . CATARACT EXTRACTION W/PHACO  10/24/2011   Procedure: CATARACT EXTRACTION PHACO AND INTRAOCULAR LENS PLACEMENT (IOC);  Surgeon: Tonny Branch, MD;  Location: [facility];  Service: Ophthalmology;  Laterality: Left;  CDE 10.35  . COLONOSCOPY  09/20/2011   Procedure: COLONOSCOPY;  Surgeon: Jamesetta So, MD;  Location: [facility];  Service: Gastroenterology;  Laterality: N/A;  . CYSTOSCOPY     with kidney stone removal-mmh  . HERNIA REPAIR     umbilical hernia repair-mmh  . INCISIONAL HERNIA REPAIR N/A 09/16/2015   Procedure: HERNIA REPAIR INCISIONAL WITH MESH;  Surgeon: Aviva Signs, MD;  Location: [facility];  Service: General;  Laterality: N/A;  Umbilicus  . INSERTION OF MESH N/A 09/16/2015   Procedure: INSERTION OF MESH UMBILICUS;  Surgeon: Aviva Signs, MD;  Location: [facility];  Service: General;  Laterality: N/A;    Current Outpatient Prescriptions on File Prior to Visit  Medication Sig Dispense Refill  . acetaminophen (TYLENOL) 325 MG tablet Take 650 mg by mouth every 6 (six) hours as needed for mild pain or moderate pain.    Marland Kitchen ALPRAZolam (XANAX) 0.5 MG tablet Take 0.5 mg by mouth 2 (two) times daily.     Marland Kitchen amitriptyline (ELAVIL) 10 MG tablet Take 10 mg by mouth at bedtime.    . carbidopa-levodopa (SINEMET IR) 25-100 MG per tablet Take 1 tablet by mouth 3 (three) times daily. 90 tablet 6  . cholecalciferol (VITAMIN D) 1000 units tablet Take 1,000 Units by mouth daily.    Marland Kitchen  FLUoxetine (PROZAC) 20 MG capsule Take 20 mg by mouth daily.    Marland Kitchen lisinopril (PRINIVIL,ZESTRIL) 2.5 MG tablet Take 2.5 mg by mouth daily.     . Multiple Vitamins-Minerals (MULTIVITAMIN WITH MINERALS) tablet Take 1 tablet by mouth daily.    . ranitidine (ZANTAC) 150 MG tablet Take 150 mg by mouth 2 (two) times daily.    . traZODone (DESYREL) 50 MG tablet Take 50 mg by mouth at bedtime.    . calcium-vitamin D (OSCAL WITH D) 500-200 MG-UNIT tablet Take 1 tablet by mouth 2 (two) times daily.    . cyclobenzaprine (FLEXERIL) 10 MG tablet Take 10 mg by mouth every 8 (eight) hours as needed for muscle spasms.    . famotidine (PEPCID) 20 MG tablet Take 20 mg by mouth  daily.    Marland Kitchen oxyCODONE-acetaminophen (PERCOCET/ROXICET) 5-325 MG tablet Take 1 tablet by mouth every 4 (four) hours as needed. 15 tablet 0  . predniSONE (DELTASONE) 20 MG tablet Take 6 tablets day one, 5 tablets day two, 4 tablets day three, 3 tablets day four, 2 tablets day five, then 1 tablet day six 21 tablet 0   No current facility-administered medications on file prior to visit.     Social History   Social History  . Marital status: Divorced    Spouse name: N/A  . Number of children: 2  . Years of education: 12   Occupational History  .  Not Employed  . Disabled     Social History Main Topics  . Smoking status: Never Smoker  . Smokeless tobacco: Never Used  . Alcohol use No  . Drug use: No  . Sexual activity: No   Other Topics Concern  . Not on file   Social History Narrative   Patient lives at [facility].     Patient is disabled.    Patient has a high school education.    Patient is divorced.    Patient has 2 children.    Patient is right handed.    Family History  Problem Relation Age of Onset  . Heart disease Unknown   . Cancer Unknown   . Hypertension Unknown   . Anesthesia problems Neg Hx   . Hypotension Neg Hx   . Malignant hyperthermia Neg Hx   . Pseudochol deficiency Neg Hx     BP (!) 146/88   Pulse (!) 106   Temp 97.7 F (36.5 C)   Ht 5\' 8"  (1.727 m)   Wt 259 lb (117.5 kg)   BMI 39.38 kg/m      Objective:   Physical Exam  Constitutional: He is oriented to person, place, and time. He appears well-developed and well-nourished.  HENT:  Head: Normocephalic and atraumatic.  Eyes: Pupils are equal, round, and reactive to light. Conjunctivae and EOM are normal.  Neck: Normal range of motion. Neck supple.  Cardiovascular: Normal rate, regular rhythm and intact distal pulses.   Pulmonary/Chest: Effort normal.  Abdominal: Soft.  Musculoskeletal: He exhibits tenderness (Right knee with full motion and no effusion.  He has slight crepitus.   Knee is stable.  Proximal tibial boney prominence.  No redness.  NV intact. Gait normal.).  Neurological: He is alert and oriented to person, place, and time. He has normal reflexes. He displays normal reflexes. No cranial nerve deficit. He exhibits normal muscle tone. Coordination normal.  Skin: Skin is warm and dry.  Psychiatric: He has a normal mood and affect. His behavior is normal. Judgment and thought content  normal.          Assessment & Plan:   Encounter Diagnosis  Name Primary?  . Chronic pain of right knee Yes   I will see him as needed.  Call if any problem.  Precautions discussed.   Electronically Signed Sanjuana Kava, MD 8/14/20189:20 AM

## 2017-10-25 DIAGNOSIS — G8929 Other chronic pain: Secondary | ICD-10-CM | POA: Diagnosis not present

## 2017-10-25 DIAGNOSIS — Z6841 Body Mass Index (BMI) 40.0 and over, adult: Secondary | ICD-10-CM | POA: Diagnosis not present

## 2017-10-25 DIAGNOSIS — I10 Essential (primary) hypertension: Secondary | ICD-10-CM | POA: Diagnosis not present

## 2017-10-25 DIAGNOSIS — Z0001 Encounter for general adult medical examination with abnormal findings: Secondary | ICD-10-CM | POA: Diagnosis not present

## 2017-10-25 DIAGNOSIS — R7309 Other abnormal glucose: Secondary | ICD-10-CM | POA: Diagnosis not present

## 2017-10-25 DIAGNOSIS — G20 Parkinson's disease: Secondary | ICD-10-CM | POA: Diagnosis not present

## 2017-10-25 DIAGNOSIS — M159 Polyosteoarthritis, unspecified: Secondary | ICD-10-CM | POA: Diagnosis not present

## 2017-10-25 DIAGNOSIS — Z1389 Encounter for screening for other disorder: Secondary | ICD-10-CM | POA: Diagnosis not present

## 2017-10-25 DIAGNOSIS — F330 Major depressive disorder, recurrent, mild: Secondary | ICD-10-CM | POA: Diagnosis not present

## 2017-10-25 DIAGNOSIS — E6609 Other obesity due to excess calories: Secondary | ICD-10-CM | POA: Diagnosis not present

## 2018-01-30 IMAGING — DX DG KNEE COMPLETE 4+V*R*
4 series · 4 of 4 positions shown · non-contrast
Comparison: 07/16/2016 Jdm [HOSPITAL]

CLINICAL DATA: Anterior right knee pain.

EXAM:
RIGHT KNEE - COMPLETE 4+ VIEW

[knee ap]
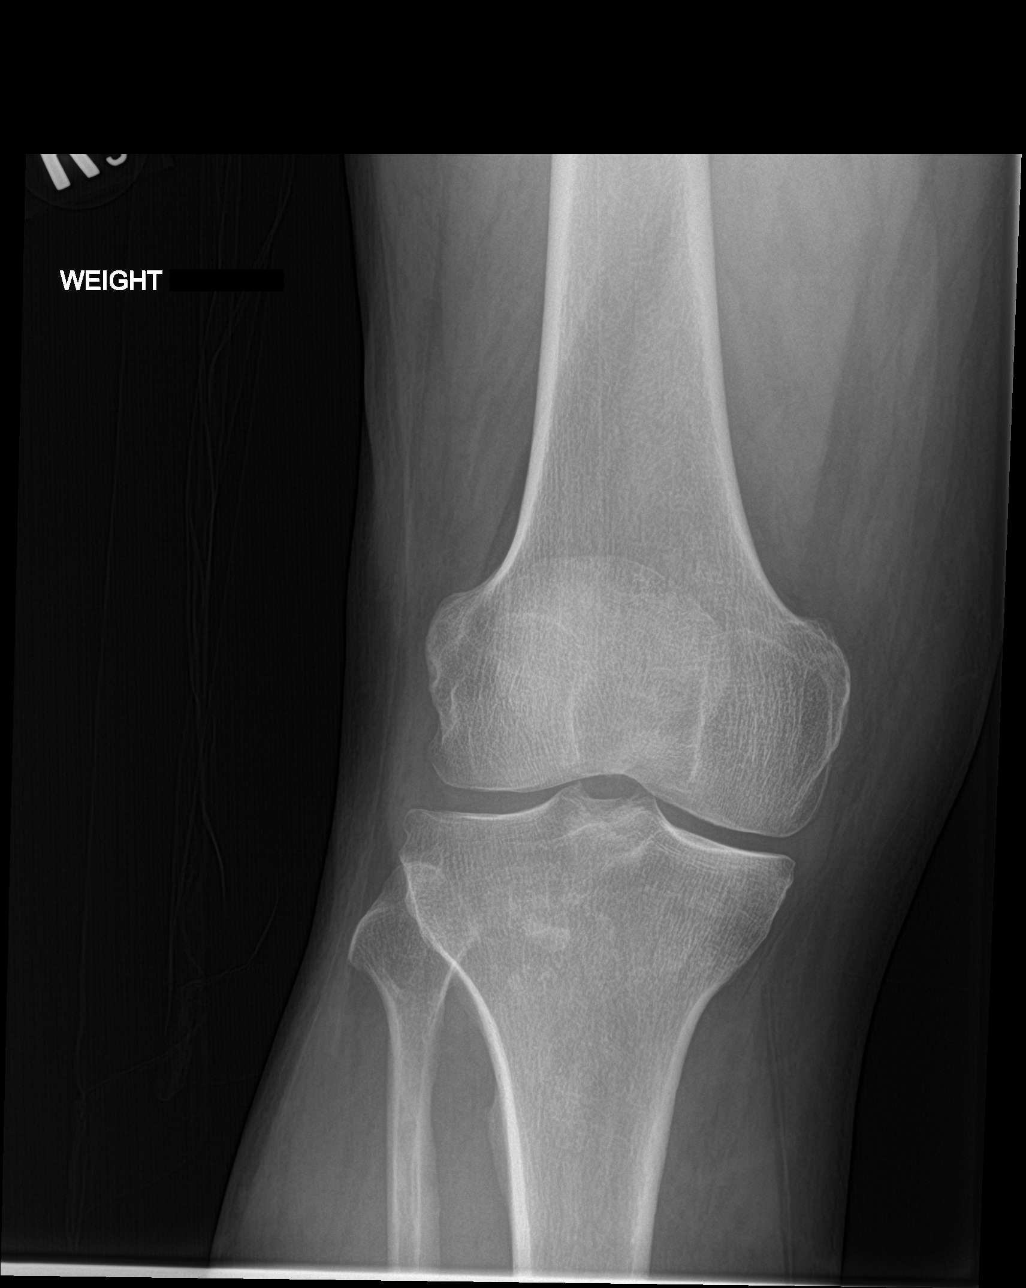

[tunnel]
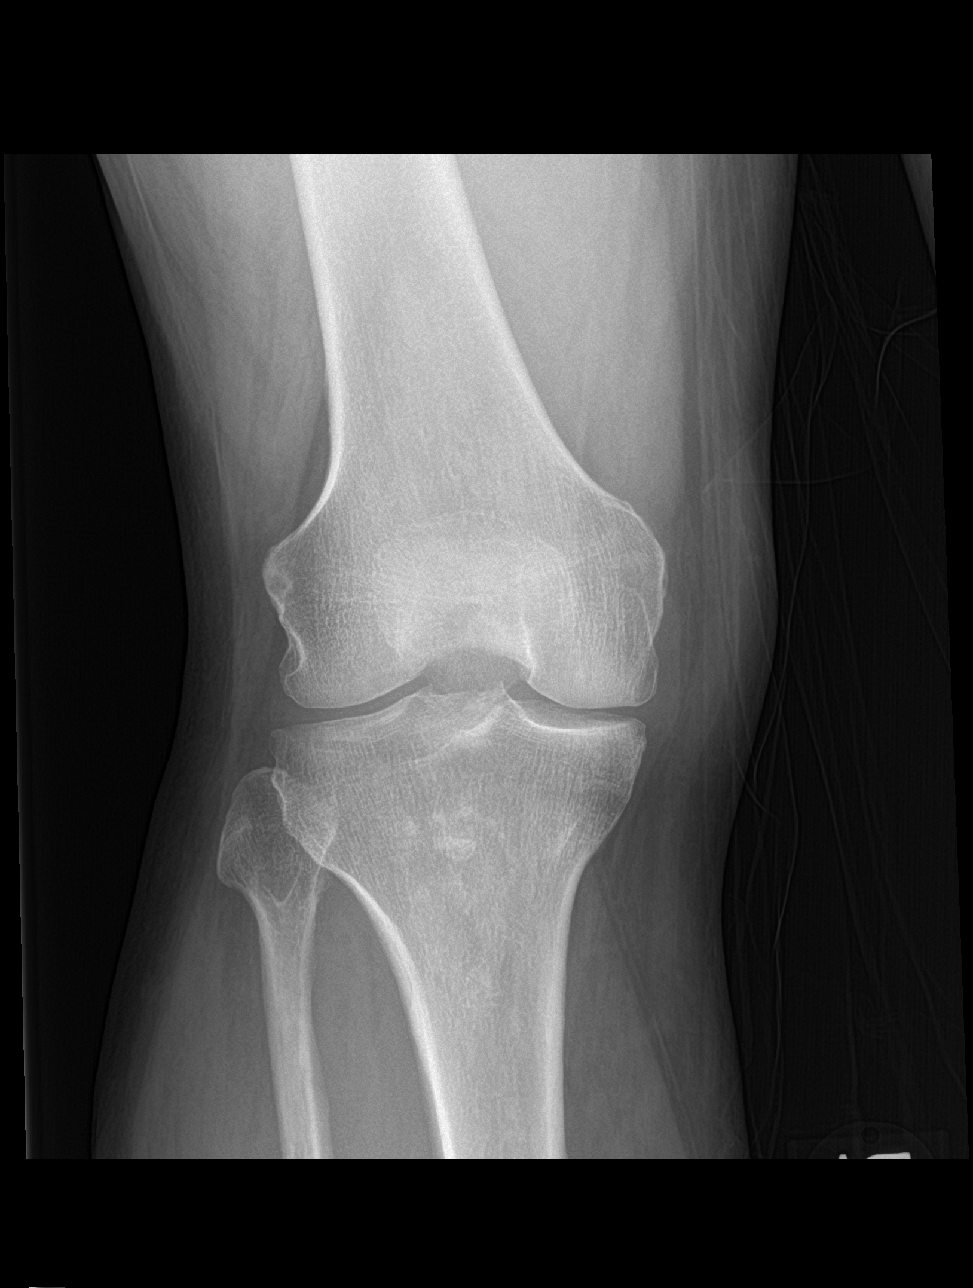

[knee lat]
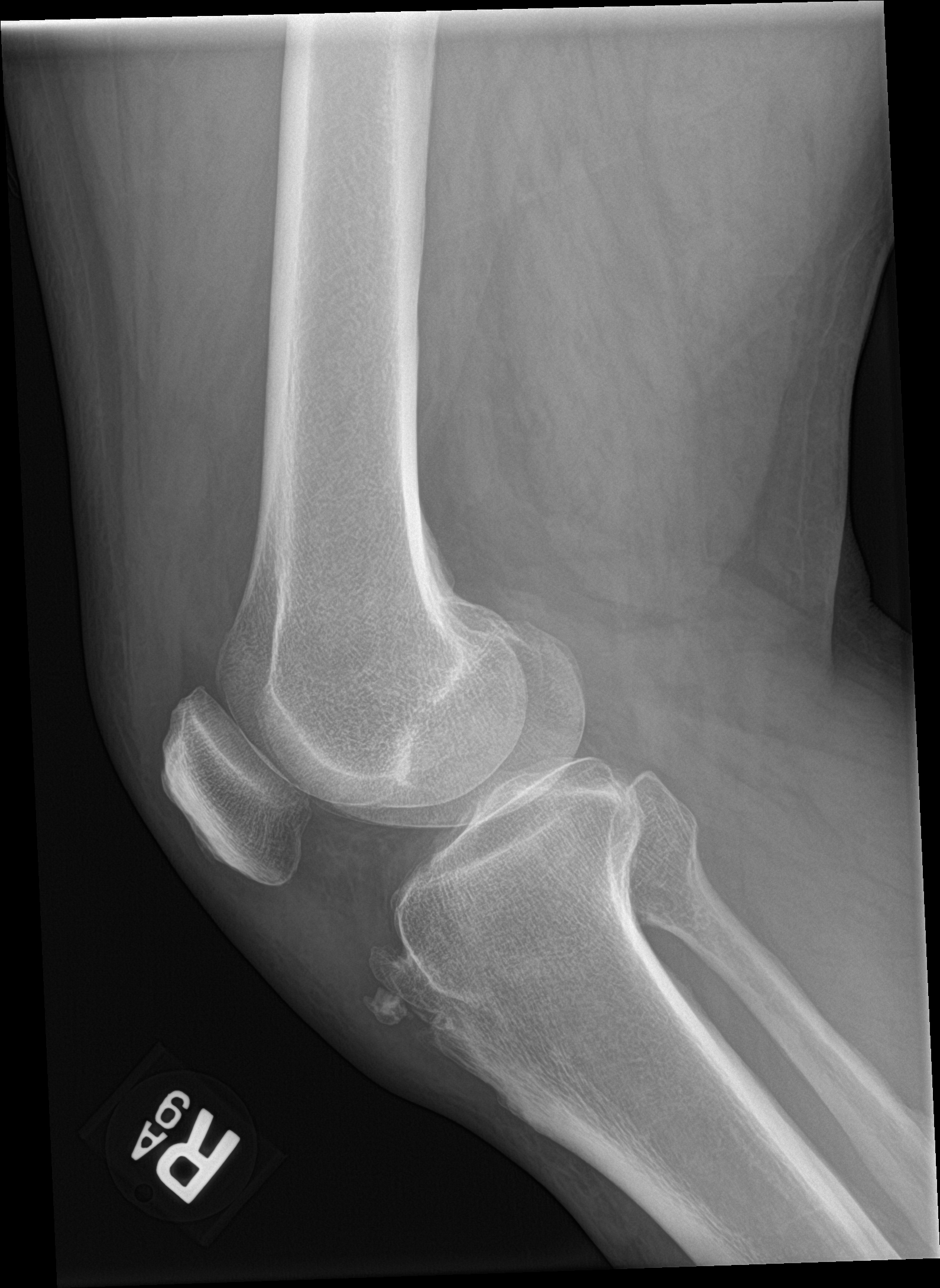

[knee sunrise]
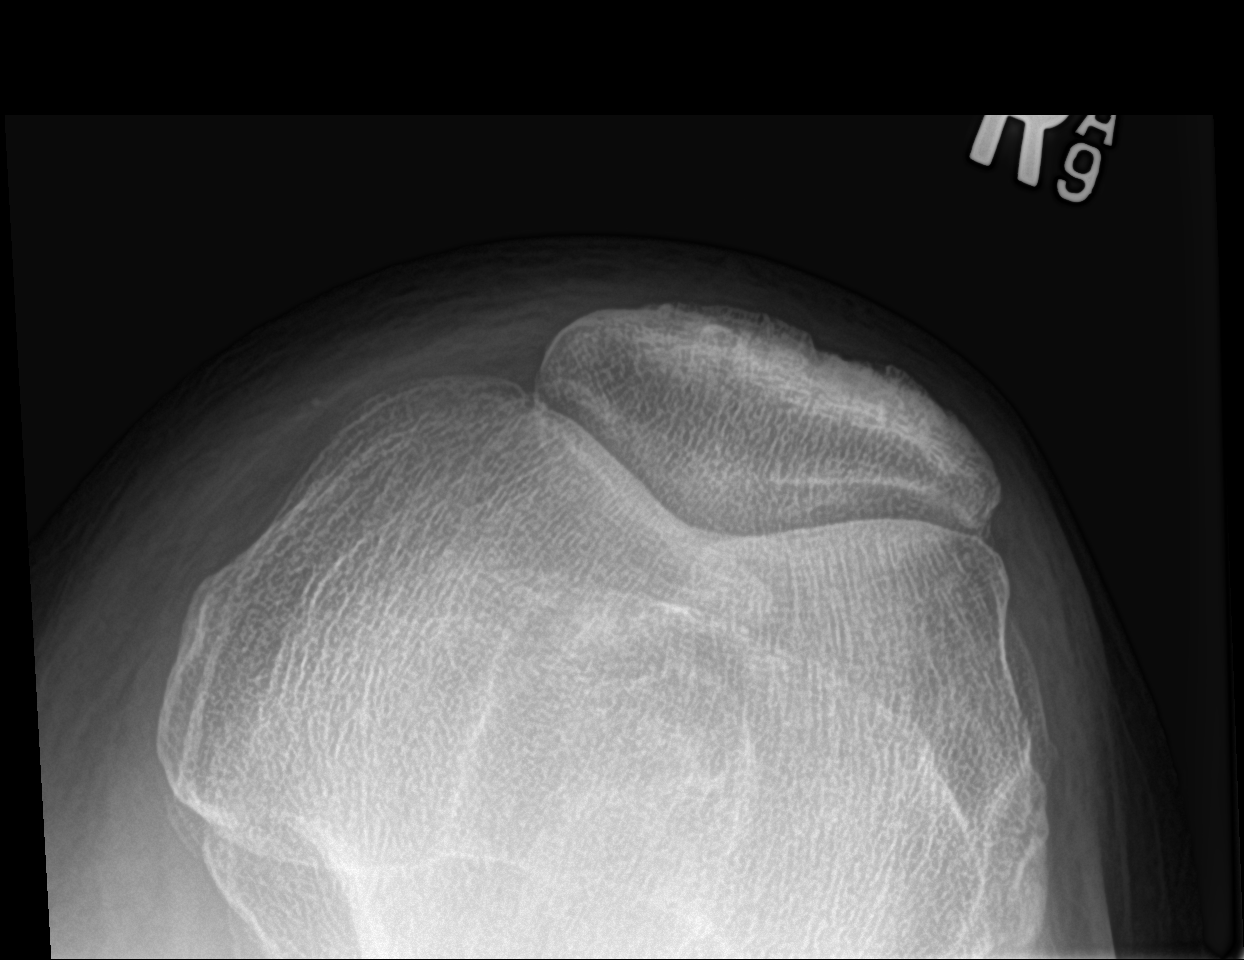

[4 of 4 positions shown; findings below may reference images not displayed]

FINDINGS: No fracture. No subluxation or dislocation. No worrisome lytic or
sclerotic osseous abnormality. Cortical irregularity at the patellar
tendon insertion of the tibial tuberosity is unchanged.
IMPRESSION: Stable since the film from 4 days ago.  No acute abnormality.

## 2018-04-19 DIAGNOSIS — Z23 Encounter for immunization: Secondary | ICD-10-CM | POA: Diagnosis not present

## 2018-04-19 DIAGNOSIS — M545 Low back pain: Secondary | ICD-10-CM | POA: Diagnosis not present

## 2018-04-19 DIAGNOSIS — E1165 Type 2 diabetes mellitus with hyperglycemia: Secondary | ICD-10-CM | POA: Diagnosis not present

## 2018-04-19 DIAGNOSIS — Z6841 Body Mass Index (BMI) 40.0 and over, adult: Secondary | ICD-10-CM | POA: Diagnosis not present

## 2018-04-19 DIAGNOSIS — S39012A Strain of muscle, fascia and tendon of lower back, initial encounter: Secondary | ICD-10-CM | POA: Diagnosis not present

## 2018-05-07 IMAGING — DX DG KNEE COMPLETE 4+V*R*
4 series · 4 of 4 positions shown · non-contrast
Comparison: 07/20/2016 .

CLINICAL DATA: Severe right knee pain.  No recent injury.

EXAM:
RIGHT KNEE - COMPLETE 4+ VIEW

[knee ap (1 of 3)]
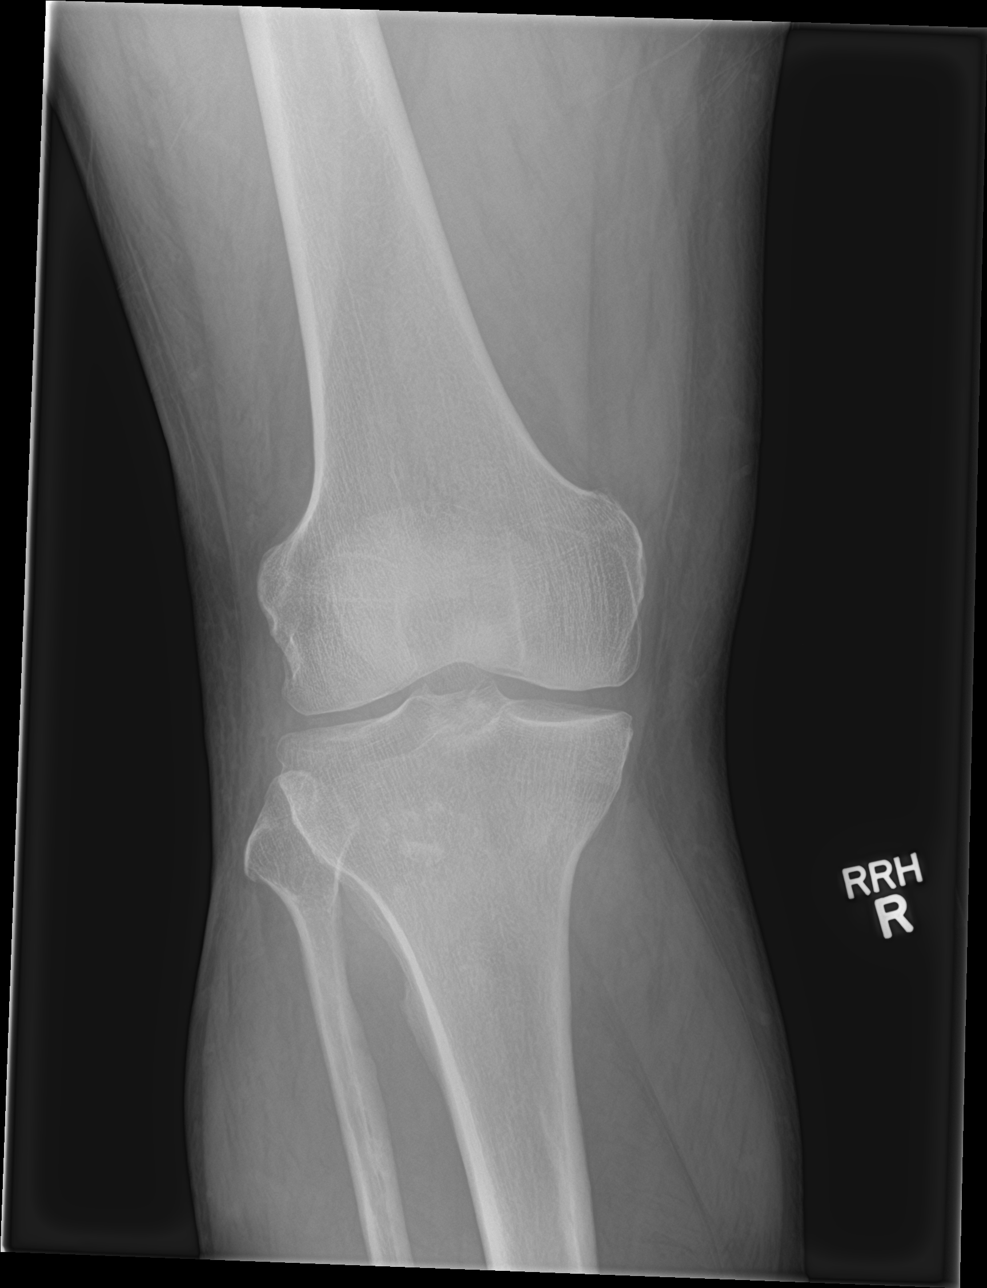

[knee ap (2 of 3)]
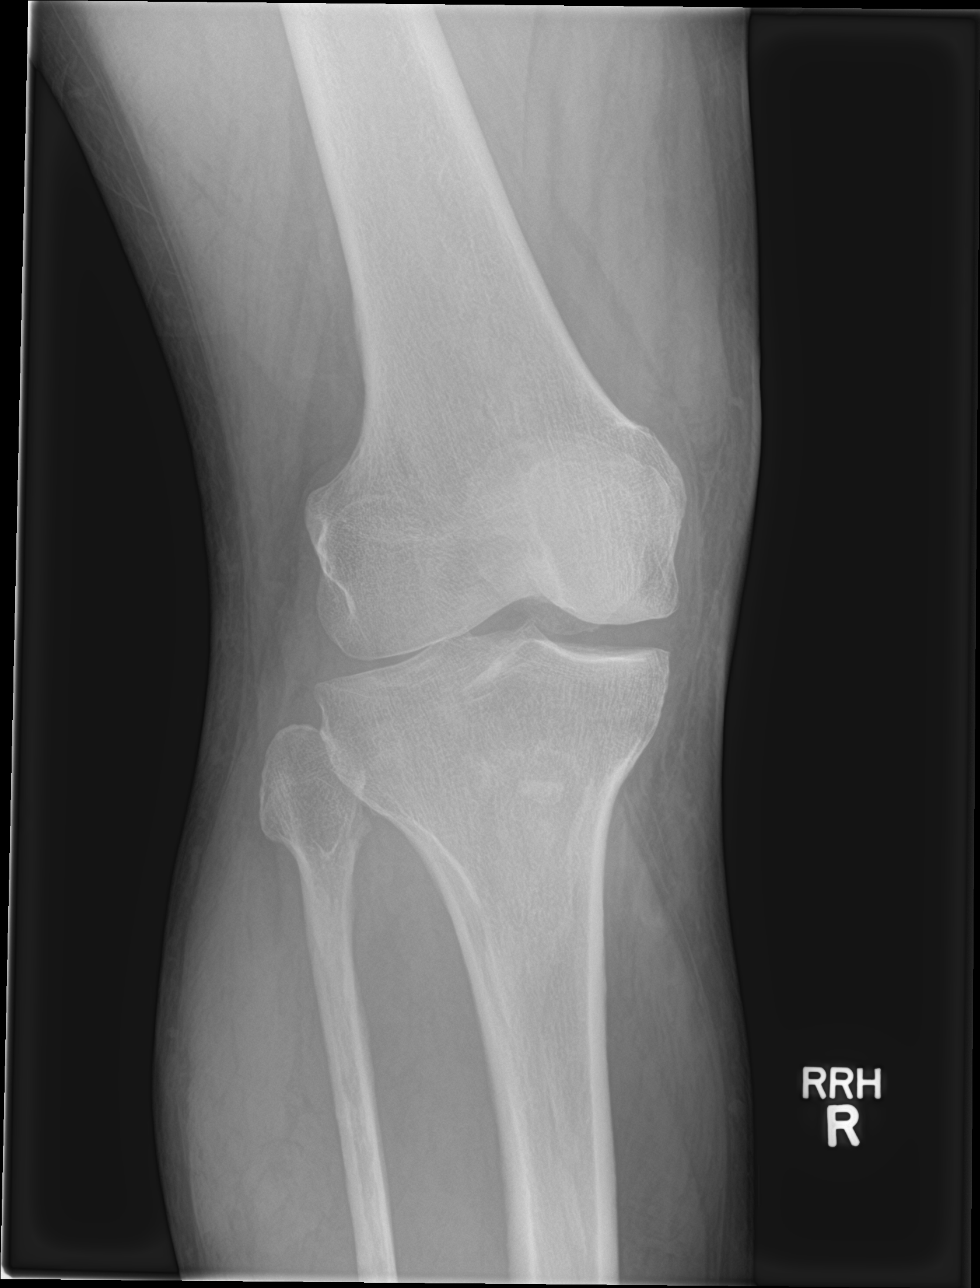

[knee ap (3 of 3)]
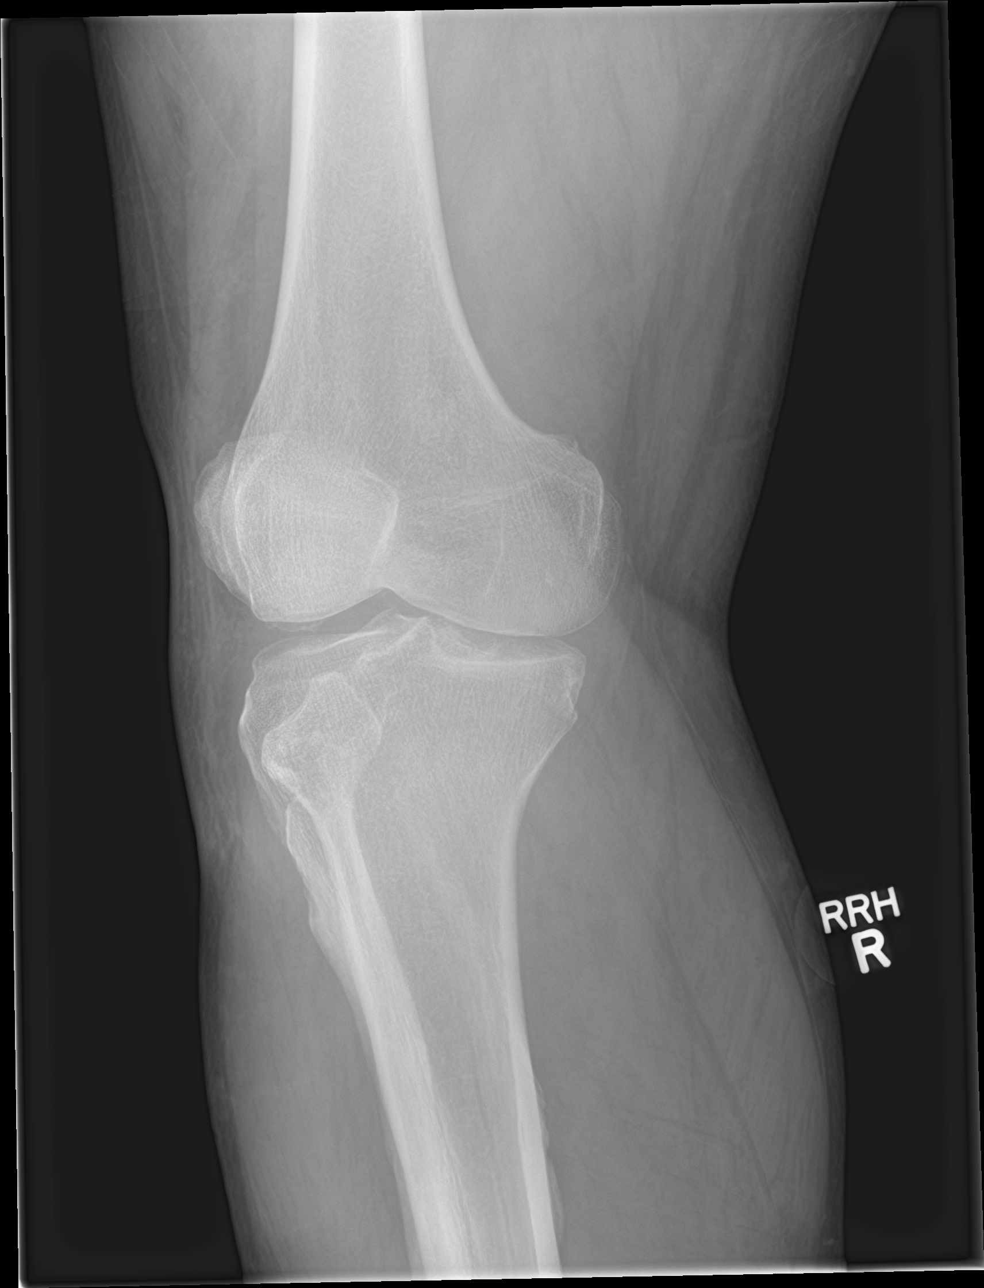

[knee lat]
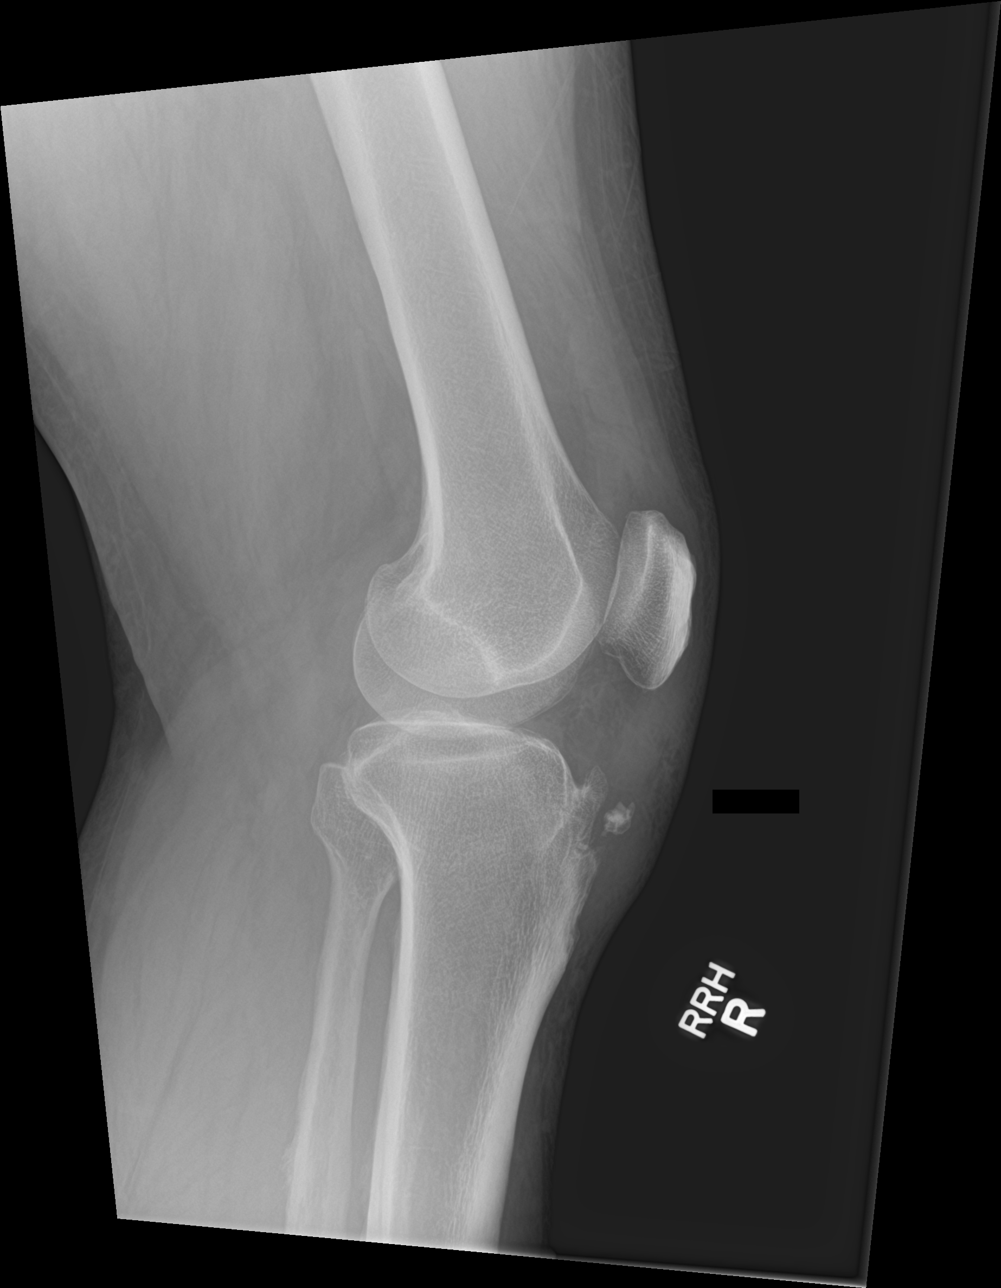

[4 of 4 positions shown; findings below may reference images not displayed]

FINDINGS: Right infrapatellar pretibial soft tissue swelling noted. Corticated
bony density is noted along the anterior tibial tuberosity. This is
most likely old. No acute bony abnormality identified. No acute
fracture. No dislocation.
IMPRESSION: Infrapatellar pretibial soft tissue swelling. No associated acute
bony abnormality .

## 2019-02-02 DIAGNOSIS — E782 Mixed hyperlipidemia: Secondary | ICD-10-CM | POA: Diagnosis not present

## 2019-02-02 DIAGNOSIS — E1165 Type 2 diabetes mellitus with hyperglycemia: Secondary | ICD-10-CM | POA: Diagnosis not present

## 2019-03-04 DIAGNOSIS — E7849 Other hyperlipidemia: Secondary | ICD-10-CM | POA: Diagnosis not present

## 2019-03-04 DIAGNOSIS — E1165 Type 2 diabetes mellitus with hyperglycemia: Secondary | ICD-10-CM | POA: Diagnosis not present

## 2019-03-04 DIAGNOSIS — I10 Essential (primary) hypertension: Secondary | ICD-10-CM | POA: Diagnosis not present

## 2019-03-08 DIAGNOSIS — E1165 Type 2 diabetes mellitus with hyperglycemia: Secondary | ICD-10-CM | POA: Diagnosis not present

## 2019-03-08 DIAGNOSIS — Z1389 Encounter for screening for other disorder: Secondary | ICD-10-CM | POA: Diagnosis not present

## 2019-03-08 DIAGNOSIS — I10 Essential (primary) hypertension: Secondary | ICD-10-CM | POA: Diagnosis not present

## 2019-03-08 DIAGNOSIS — Z0001 Encounter for general adult medical examination with abnormal findings: Secondary | ICD-10-CM | POA: Diagnosis not present

## 2019-03-08 DIAGNOSIS — G20 Parkinson's disease: Secondary | ICD-10-CM | POA: Diagnosis not present

## 2019-03-08 DIAGNOSIS — Z23 Encounter for immunization: Secondary | ICD-10-CM | POA: Diagnosis not present

## 2019-03-18 DIAGNOSIS — S39012A Strain of muscle, fascia and tendon of lower back, initial encounter: Secondary | ICD-10-CM | POA: Diagnosis not present

## 2019-03-18 DIAGNOSIS — M62830 Muscle spasm of back: Secondary | ICD-10-CM | POA: Diagnosis not present

## 2019-05-01 DIAGNOSIS — E083211 Diabetes mellitus due to underlying condition with mild nonproliferative diabetic retinopathy with macular edema, right eye: Secondary | ICD-10-CM | POA: Diagnosis not present

## 2019-06-02 DIAGNOSIS — E1165 Type 2 diabetes mellitus with hyperglycemia: Secondary | ICD-10-CM | POA: Diagnosis not present

## 2019-06-02 DIAGNOSIS — I10 Essential (primary) hypertension: Secondary | ICD-10-CM | POA: Diagnosis not present

## 2019-06-02 DIAGNOSIS — E7849 Other hyperlipidemia: Secondary | ICD-10-CM | POA: Diagnosis not present

## 2019-06-26 DIAGNOSIS — Z1389 Encounter for screening for other disorder: Secondary | ICD-10-CM | POA: Diagnosis not present

## 2019-06-26 DIAGNOSIS — R600 Localized edema: Secondary | ICD-10-CM | POA: Diagnosis not present

## 2019-06-26 DIAGNOSIS — R110 Nausea: Secondary | ICD-10-CM | POA: Diagnosis not present

## 2019-06-26 DIAGNOSIS — L03119 Cellulitis of unspecified part of limb: Secondary | ICD-10-CM | POA: Diagnosis not present

## 2019-06-26 DIAGNOSIS — E1165 Type 2 diabetes mellitus with hyperglycemia: Secondary | ICD-10-CM | POA: Diagnosis not present

## 2019-07-03 DIAGNOSIS — E7849 Other hyperlipidemia: Secondary | ICD-10-CM | POA: Diagnosis not present

## 2019-07-03 DIAGNOSIS — I10 Essential (primary) hypertension: Secondary | ICD-10-CM | POA: Diagnosis not present

## 2019-07-03 DIAGNOSIS — E1165 Type 2 diabetes mellitus with hyperglycemia: Secondary | ICD-10-CM | POA: Diagnosis not present

## 2019-09-02 DIAGNOSIS — E1165 Type 2 diabetes mellitus with hyperglycemia: Secondary | ICD-10-CM | POA: Diagnosis not present

## 2019-09-02 DIAGNOSIS — E7849 Other hyperlipidemia: Secondary | ICD-10-CM | POA: Diagnosis not present

## 2019-09-02 DIAGNOSIS — G20 Parkinson's disease: Secondary | ICD-10-CM | POA: Diagnosis not present

## 2019-09-02 DIAGNOSIS — I10 Essential (primary) hypertension: Secondary | ICD-10-CM | POA: Diagnosis not present

## 2019-11-20 LAB — MICROALBUMIN, URINE: Microalb, Ur: 23.7

## 2019-11-20 LAB — HEMOGLOBIN A1C: Hemoglobin A1C: >14

## 2020-01-02 LAB — LIPID PANEL
Cholesterol: 165 (ref 0–200)
HDL: 41 (ref 35–70)
LDL Cholesterol: 85
Triglycerides: 236 — AB (ref 40–160)

## 2020-01-02 LAB — BASIC METABOLIC PANEL
BUN: 13 (ref 4–21)
Creatinine: 1 (ref <=1.3)

## 2020-01-02 LAB — COMPREHENSIVE METABOLIC PANEL
GFR calc Af Amer: 88
GFR calc non Af Amer: 76

## 2020-01-23 ENCOUNTER — Other Ambulatory Visit: Payer: Self-pay

## 2020-01-23 ENCOUNTER — Ambulatory Visit (INDEPENDENT_AMBULATORY_CARE_PROVIDER_SITE_OTHER): Payer: Medicare Other | Admitting: "Endocrinology

## 2020-01-23 ENCOUNTER — Encounter: Payer: Self-pay | Admitting: "Endocrinology

## 2020-01-23 VITALS — BP 132/84 | HR 88 | Ht 67.0 in | Wt 266.2 lb

## 2020-01-23 DIAGNOSIS — E11649 Type 2 diabetes mellitus with hypoglycemia without coma: Secondary | ICD-10-CM | POA: Diagnosis not present

## 2020-01-23 MED ORDER — GLIPIZIDE ER 5 MG PO TB24: 5.0000 mg | ORAL_TABLET | Freq: Every day | ORAL | 3 refills | Status: DC

## 2020-01-23 NOTE — Progress Notes (Signed)
Endocrinology Consult Note       01/23/2020, 4:35 PM   Subjective:    Patient ID: Joe [name], male    DOB: [birth year].  Joe [name] is being seen in consultation for management of currently uncontrolled symptomatic diabetes requested by  [place].   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    Past Surgical History:  Procedure Laterality Date   APPENDECTOMY  38    mmh   CATARACT EXTRACTION W/PHACO  12/09/2010   Procedure: CATARACT EXTRACTION PHACO AND INTRAOCULAR LENS PLACEMENT (IOC);  Surgeon: Tonny Branch;  Location: [facility];  Service: Ophthalmology;  Laterality: Right;  CDE: 36.21   CATARACT EXTRACTION W/PHACO  10/24/2011   Procedure: CATARACT EXTRACTION PHACO AND INTRAOCULAR LENS PLACEMENT (IOC);  Surgeon: Tonny Branch, MD;  Location: [facility];  Service: Ophthalmology;  Laterality: Left;  CDE 10.35   COLONOSCOPY  09/20/2011   Procedure: COLONOSCOPY;  Surgeon: Jamesetta So, MD;  Location: [facility];  Service: Gastroenterology;  Laterality: N/A;   CYSTOSCOPY     with kidney stone removal-mmh   HERNIA REPAIR     umbilical hernia repair-mmh   INCISIONAL HERNIA REPAIR N/A 09/16/2015   Procedure: HERNIA REPAIR INCISIONAL WITH MESH;  Surgeon: Aviva Signs, MD;  Location: [facility];  Service: General;  Laterality: N/A;  Umbilicus   INSERTION OF MESH N/A 09/16/2015   Procedure: INSERTION OF MESH UMBILICUS;  Surgeon: Aviva Signs, MD;  Location: [facility];  Service: General;   Laterality: N/A;    Social History   Socioeconomic History   Marital status: Divorced    Spouse name: Not on file   Number of children: 2   Years of education: 12   Highest education level: Not on file  Occupational History    Employer: NOT EMPLOYED   Occupation: Disabled   Tobacco Use   Smoking status: Never Smoker   Smokeless tobacco: Never Used  Substance and Sexual Activity   Alcohol use: No    Alcohol/week: 0.0 standard drinks   Drug use: No   Sexual activity: Never    Birth control/protection: None  Other Topics Concern   Not on file  Social History Narrative   Patient lives at [facility].     Patient is disabled.    Patient has a high school education.    Patient is divorced.    Patient has 2 children.    Patient is right handed.   Social Determinants of Health   Financial Resource Strain:    Difficulty of Paying Living Expenses: Not on file  Food Insecurity:  Worried About Charity fundraiser in the Last Year: Not on file   [employer] of Food in the Last Year: Not on file  Transportation Needs:    Lack of Transportation (Medical): Not on file   Lack of Transportation (Non-Medical): Not on file  Physical Activity:    Days of Exercise per Week: Not on file   Minutes of Exercise per Session: Not on file  Stress:    Feeling of Stress : Not on file  Social Connections:    Frequency of Communication with Friends and Family: Not on file   Frequency of Social Gatherings with Friends and Family: Not on file   Attends Religious Services: Not on file   Active Member of [place] or Organizations: Not on file   Attends Archivist Meetings: Not on file   Marital Status: Not on file    Family History  Problem Relation Age of Onset   Heart disease Other    Cancer Other    Hypertension Other    Anesthesia problems Neg Hx    Hypotension Neg Hx    Malignant hyperthermia Neg Hx    Pseudochol deficiency Neg Hx      Outpatient Encounter Medications as of 01/23/2020  Medication Sig   [DISCONTINUED] methocarbamol (ROBAXIN) 500 MG tablet Take 500 mg by mouth 3 (three) times daily as needed for muscle spasms.   [DISCONTINUED] naproxen (NAPROSYN) 250 MG tablet Take 250 mg by mouth daily as needed.   [DISCONTINUED] sulfamethoxazole-trimethoprim (BACTRIM DS) 800-160 MG tablet Take 1 tablet by mouth 2 (two) times daily.   [DISCONTINUED] tiZANidine (ZANAFLEX) 4 MG capsule Take 4 mg by mouth 3 (three) times daily as needed for muscle spasms.   amitriptyline (ELAVIL) 25 MG tablet Take 25 mg by mouth at bedtime.   calcium-vitamin D (OSCAL WITH D) 500-200 MG-UNIT tablet Take 1 tablet by mouth 2 (two) times daily.   cholecalciferol (VITAMIN D) 1000 units tablet Take 1,000 Units by mouth daily.   famotidine (PEPCID) 20 MG tablet Take 20 mg by mouth daily.   FLUoxetine (PROZAC) 20 MG capsule Take 20 mg by mouth daily.   glipiZIDE (GLUCOTROL XL) 5 MG 24 hr tablet Take 1 tablet (5 mg total) by mouth daily with breakfast.   lisinopril (PRINIVIL,ZESTRIL) 2.5 MG tablet Take 2.5 mg by mouth daily.    metFORMIN (GLUCOPHAGE-XR) 500 MG 24 hr tablet Take 500 mg by mouth 2 (two) times daily.   Multiple Vitamins-Minerals (MULTIVITAMIN WITH MINERALS) tablet Take 1 tablet by mouth daily.   ranitidine (ZANTAC) 150 MG tablet Take 150 mg by mouth 2 (two) times daily.   traZODone (DESYREL) 50 MG tablet Take 50 mg by mouth at bedtime.   [DISCONTINUED] acetaminophen (TYLENOL) 325 MG tablet Take 650 mg by mouth every 6 (six) hours as needed for mild pain or moderate pain.   [DISCONTINUED] ALPRAZolam (XANAX) 0.5 MG tablet Take 0.5 mg by mouth 2 (two) times daily.    [DISCONTINUED] amitriptyline (ELAVIL) 10 MG tablet Take 10 mg by mouth at bedtime.   [DISCONTINUED] carbidopa-levodopa (SINEMET IR) 25-100 MG per tablet Take 1 tablet by mouth 3 (three) times daily.   [DISCONTINUED] cyclobenzaprine (FLEXERIL) 10 MG tablet  Take 10 mg by mouth every 8 (eight) hours as needed for muscle spasms.   [DISCONTINUED] oxyCODONE-acetaminophen (PERCOCET/ROXICET) 5-325 MG tablet Take 1 tablet by mouth every 4 (four) hours as needed.   [DISCONTINUED] predniSONE (DELTASONE) 20 MG tablet Take 6 tablets day one, 5 tablets day two,  4 tablets day three, 3 tablets day four, 2 tablets day five, then 1 tablet day six   [DISCONTINUED] TOUJEO SOLOSTAR 300 UNIT/ML Solostar Pen Inject into the skin.   No facility-administered encounter medications on file as of 01/23/2020.    ALLERGIES: No Known Allergies  VACCINATION STATUS:  There is no immunization history on file for this patient.  Diabetes He presents for his initial diabetic visit. He has type 2 diabetes mellitus. Onset time: She was diagnosed at approximate age of 85 years. His disease course has been worsening. There are no hypoglycemic associated symptoms. Pertinent negatives for hypoglycemia include no confusion, headaches, pallor or seizures. Associated symptoms include blurred vision, polydipsia and polyuria. Pertinent negatives for diabetes include no chest pain, no fatigue, no polyphagia and no weakness. There are no hypoglycemic complications. Symptoms are worsening. (Obesity/sedentary life.) Risk factors for coronary artery disease include diabetes mellitus, family history, male sex, hypertension, obesity and sedentary lifestyle. Current diabetic treatment includes oral agent (monotherapy). His weight is fluctuating minimally. He is following a generally unhealthy diet. When asked about meal planning, he reported none. He never participates in exercise. (He did not bring any logs nor meter to review.  His A1c was greater than 14% on November 20, 2019.) An ACE inhibitor/angiotensin II receptor blocker is being taken.  Hypertension This is a chronic problem. The current episode started more than 1 year ago. Associated symptoms include blurred vision. Pertinent negatives  include no chest pain, headaches, neck pain, palpitations or shortness of breath. Risk factors for coronary artery disease include diabetes mellitus, male gender, obesity, sedentary lifestyle and family history. Past treatments include ACE inhibitors.     Review of Systems  Constitutional: Negative for chills, fatigue, fever and unexpected weight change.  HENT: Negative for dental problem, mouth sores and trouble swallowing.   Eyes: Positive for blurred vision. Negative for visual disturbance.  Respiratory: Negative for cough, choking, chest tightness, shortness of breath and wheezing.   Cardiovascular: Negative for chest pain, palpitations and leg swelling.  Gastrointestinal: Negative for abdominal distention, abdominal pain, constipation, diarrhea, nausea and vomiting.  Endocrine: Positive for polydipsia and polyuria. Negative for polyphagia.  Genitourinary: Negative for dysuria, flank pain, hematuria and urgency.  Musculoskeletal: Negative for back pain, gait problem, myalgias and neck pain.  Skin: Negative for pallor, rash and wound.  Neurological: Negative for seizures, syncope, weakness, numbness and headaches.  Psychiatric/Behavioral: Negative for confusion and dysphoric mood.    Objective:    Vitals with BMI 01/23/2020 11/15/2016 10/25/2016  Height 5\' 7"  5\' 8"  -  Weight 266 lbs 3 oz 259 lbs -  BMI 16.10 96.0 -  Systolic 454 098 119  Diastolic 84 88 80  Pulse 88 106 55    BP 132/84    Pulse 88    Ht 5\' 7"  (1.702 m)    Wt 266 lb 3.2 oz (120.7 kg)    BMI 41.69 kg/m   Wt Readings from Last 3 Encounters:  01/23/20 266 lb 3.2 oz (120.7 kg)  11/15/16 259 lb (117.5 kg)  03/08/16 230 lb (104.3 kg)     Physical Exam Constitutional:      General: He is not in acute distress.    Appearance: He is well-developed.  HENT:     Head: Normocephalic and atraumatic.  Neck:     Thyroid: No thyromegaly.     Trachea: No tracheal deviation.  Cardiovascular:     Rate and Rhythm: Normal  rate.     Pulses:  Dorsalis pedis pulses are 1+ on the right side and 1+ on the left side.       Posterior tibial pulses are 1+ on the right side and 1+ on the left side.     Heart sounds: Normal heart sounds, S1 normal and S2 normal. No murmur heard.  No gallop.   Pulmonary:     Effort: Pulmonary effort is normal. No respiratory distress.     Breath sounds: No wheezing.  Abdominal:     General: There is no distension.     Palpations: Abdomen is soft.     Tenderness: There is no abdominal tenderness. There is no guarding.     Comments: Obese  Musculoskeletal:     Right shoulder: No swelling or deformity.     Cervical back: Normal range of motion and neck supple.     Comments: Poor feet hygiene, monofilament testing normal , dorsalis pedis and posterior tibial arterial pulses are normal.  Skin:    General: Skin is warm and dry.     Findings: No rash.     Nails: There is no clubbing.  Neurological:     Mental Status: He is alert and oriented to person, place, and time.     Cranial Nerves: No cranial nerve deficit.     Sensory: No sensory deficit.     Gait: Gait normal.     Deep Tendon Reflexes: Reflexes are normal and symmetric.  Psychiatric:        Speech: Speech normal.        Behavior: Behavior normal. Behavior is cooperative.        Thought Content: Thought content normal.        Judgment: Judgment normal.       CMP ( most recent) CMP     Component Value Date/Time   NA 140 02/06/2016 1850   K 4.5 02/06/2016 1850   CL 103 02/06/2016 1850   CO2 24 02/06/2016 1850   GLUCOSE 74 02/06/2016 1850   BUN 13 01/02/2020 0000   CREATININE 1.0 01/02/2020 0000   CREATININE 1.56 (H) 02/06/2016 1850   CALCIUM 10.2 02/06/2016 1850   PROT 7.8 02/06/2016 1850   ALBUMIN 4.7 02/06/2016 1850   AST 30 02/06/2016 1850   ALT 35 02/06/2016 1850   ALKPHOS 39 02/06/2016 1850   BILITOT 0.6 02/06/2016 1850   GFRNONAA 76 01/02/2020 0000   GFRAA 88 01/02/2020 0000     Diabetic  Labs (most recent): Lab Results  Component Value Date   HGBA1C >14 11/20/2019     Lipid Panel ( most recent) Lipid Panel     Component Value Date/Time   CHOL 165 01/02/2020 0000   TRIG 236 (A) 01/02/2020 0000   HDL 41 01/02/2020 0000   LDLCALC 85 01/02/2020 0000      Assessment & Plan:   1. Uncontrolled type 2 diabetes mellitus with hypoglycemia without coma ([place])  - SHIMON TROWBRIDGE has currently uncontrolled symptomatic type 2 DM since  69 years of age,  with most recent A1c of >14 %. Recent labs reviewed. - I had a long discussion with him about the progressive nature of diabetes and the pathology behind its complications. -his diabetes is complicated by obesity/sedentary life and he remains at a high risk for more acute and chronic complications which include CAD, CVA, CKD, retinopathy, and neuropathy. These are all discussed in detail with him.  - I have counseled him on diet  and weight management  by adopting a carbohydrate  restricted/protein rich diet. Patient is encouraged to switch to  unprocessed or minimally processed     complex starch and increased protein intake (animal or plant source), fruits, and vegetables. -  he is advised to stick to a routine mealtimes to eat 3 meals  a day and avoid unnecessary snacks ( to snack only to correct hypoglycemia).   - he admits that there is a room for improvement in his food and drink choices. - Suggestion is made for him to avoid simple carbohydrates  from his diet including Cakes, Sweet Desserts, Ice Cream, Soda (diet and regular), Sweet Tea, Candies, Chips, Cookies, Store Bought Juices, Alcohol in Excess of  1-2 drinks a day, Artificial Sweeteners,  Coffee Creamer, and "Sugar-free" Products. This will help patient to have more stable blood glucose profile and potentially avoid unintended weight gain.  - he will be scheduled with Jearld Fenton, RDN, CDE for diabetes education.  - I have approached him with the following  individualized plan to manage  his diabetes and patient agrees:   - he will likely need insulin treatment in order for him to achieve control of diabetes to target.   -However, patient appears to have suboptimal understanding, priority would be to avoid hypoglycemia.   -In preparation, he is approached to start strict monitoring of glucose 4 times a day-before meals and at bedtime and return in 1 week with his meter and logs for evaluation.  - he is encouraged to call clinic for blood glucose levels less than 70 or above 300 mg /dl. - he is advised to continue increase Metformin to 500 mg p.o. twice daily, therapeutically suitable for patient . -I discussed and added glipizide 5 mg XL p.o. daily at breakfast.  - he will be considered for incretin therapy as appropriate next visit.  - Specific targets for  A1c;  LDL, HDL,  and Triglycerides were discussed with the patient.  2) Blood Pressure /Hypertension:  his blood pressure is  controlled to target.   he is advised to continue his current medications including lisinopril 2.5 mg p.o. daily with breakfast . 3) Lipids/Hyperlipidemia:   Review of his recent lipid panel showed  controlled  LDL at 85 .  Patient is not on statin.  He is advised to bring his medications for reconciliation .  He will be considered for low-dose statin next visit.  4)  Weight/Diet:  Body mass index is 41.69 kg/m.  -   clearly complicating his diabetes care.   he is  a candidate for weight loss. I discussed with him the fact that loss of 5 - 10% of his  current body weight will have the most impact on his diabetes management.  Exercise, and detailed carbohydrates information provided  -  detailed on discharge instructions.  5) Chronic Care/Health Maintenance:  -he  is on ACEI medications and  is encouraged to initiate and continue to follow up with Ophthalmology, Dentist,  Podiatrist at least yearly or according to recommendations, and advised to   stay away from smoking.  I have recommended yearly flu vaccine and pneumonia vaccine at least every 5 years; moderate intensity exercise for up to 150 minutes weekly; and  sleep for at least 7 hours a day.  - he is  advised to maintain close follow up with [facility] for primary care needs, as well as his other providers for optimal and coordinated care.   - Time spent in this patient care: 60 min, of which >  50% was spent in  counseling  him about his uncontrolled, symptomatic type 2 diabetes; hypertension; obesity/sedentary life and the rest reviewing his blood glucose logs , discussing his hypoglycemia and hyperglycemia episodes, reviewing his current and  previous labs / studies  ( including abstraction from other facilities) and medications  doses and developing a  long term treatment plan based on the latest standards of care/ guidelines; and documenting his care.    Please refer to Patient Instructions for Blood Glucose Monitoring and Insulin/Medications Dosing Guide"  in media tab for additional information. Please  also refer to " Patient Self Inventory" in the Media  tab for reviewed elements of pertinent patient history.  Cyndi Bender participated in the discussions, expressed understanding, and voiced agreement with the above plans.  All questions were answered to his satisfaction. he is encouraged to contact clinic should he have any questions or concerns prior to his return visit.   Follow up plan: - Return in about 1 week (around 01/30/2020) for F/U with Meter and Logs Only - no Labs.  Glade Lloyd, MD [facility] [name] [place], [place] [address] Phone: [phone]  Fax: [phone]    01/23/2020, 4:35 PM  This note was partially dictated with voice recognition software. Similar sounding words can be transcribed inadequately or may not  be corrected upon review.

## 2020-01-23 NOTE — Patient Instructions (Signed)

## 2020-01-30 ENCOUNTER — Encounter: Payer: Self-pay | Admitting: "Endocrinology

## 2020-01-30 ENCOUNTER — Ambulatory Visit (INDEPENDENT_AMBULATORY_CARE_PROVIDER_SITE_OTHER): Payer: Medicare Other | Admitting: "Endocrinology

## 2020-01-30 ENCOUNTER — Other Ambulatory Visit: Payer: Self-pay

## 2020-01-30 VITALS — BP 124/77 | HR 93 | Ht 67.0 in | Wt 261.0 lb

## 2020-01-30 DIAGNOSIS — E559 Vitamin D deficiency, unspecified: Secondary | ICD-10-CM | POA: Diagnosis not present

## 2020-01-30 DIAGNOSIS — I10 Essential (primary) hypertension: Secondary | ICD-10-CM | POA: Diagnosis not present

## 2020-01-30 DIAGNOSIS — E11649 Type 2 diabetes mellitus with hypoglycemia without coma: Secondary | ICD-10-CM | POA: Diagnosis not present

## 2020-01-30 DIAGNOSIS — E782 Mixed hyperlipidemia: Secondary | ICD-10-CM | POA: Diagnosis not present

## 2020-01-30 MED ORDER — ATORVASTATIN CALCIUM 20 MG PO TABS: 20.0000 mg | ORAL_TABLET | Freq: Every day | ORAL | 2 refills | Status: DC

## 2020-01-30 MED ORDER — LISINOPRIL 5 MG PO TABS: 5.0000 mg | ORAL_TABLET | Freq: Every day | ORAL | 1 refills | Status: DC

## 2020-01-30 NOTE — Progress Notes (Signed)
01/30/2020, 2:08 PM  Endocrinology follow-up note   Subjective:    Patient ID: Joe [name], male    DOB: 1950/06/09.  Joe [name] is being seen in follow-up after he was seen in consultation for management of currently uncontrolled symptomatic diabetes requested by  Sharilyn Sites, MD.   Past Medical History:  Diagnosis Date  . Anxiety   . Arthritis    osteoarthritis  . Borderline diabetes   . Chronic pain   . Depression   . DJD (degenerative joint disease)   . GERD (gastroesophageal reflux disease)   . H/O renal calculi 1992  . Hypertension   . 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 Muscle weakness   . Obesity   . Psychosis ([place])   . Sleep apnea    STOP BANG; 7;Has study and was positive but have not gotten CPAP yet.    Past Surgical History:  Procedure Laterality Date  . APPENDECTOMY  81    mmh  . CATARACT EXTRACTION W/PHACO  12/09/2010   Procedure: CATARACT EXTRACTION PHACO AND INTRAOCULAR LENS PLACEMENT (IOC);  Surgeon: Tonny Branch;  Location: [facility];  Service: Ophthalmology;  Laterality: Right;  CDE: 36.21  . CATARACT EXTRACTION W/PHACO  10/24/2011   Procedure: CATARACT EXTRACTION PHACO AND INTRAOCULAR LENS PLACEMENT (IOC);  Surgeon: Tonny Branch, MD;  Location: [facility];  Service: Ophthalmology;  Laterality: Left;  CDE 10.35  . COLONOSCOPY  09/20/2011   Procedure: COLONOSCOPY;  Surgeon: Jamesetta So, MD;  Location: [facility];  Service: Gastroenterology;  Laterality: N/A;  . CYSTOSCOPY     with kidney stone removal-mmh  . HERNIA REPAIR     umbilical hernia repair-mmh  . INCISIONAL HERNIA REPAIR N/A 09/16/2015   Procedure: HERNIA REPAIR INCISIONAL WITH MESH;  Surgeon: Aviva Signs, MD;  Location: [facility];  Service: General;  Laterality: N/A;  Umbilicus  . INSERTION OF MESH N/A 09/16/2015   Procedure: INSERTION OF MESH UMBILICUS;  Surgeon: Aviva Signs, MD;  Location: [facility];  Service: General;   Laterality: N/A;    Social History   Socioeconomic History  . Marital status: Divorced    Spouse name: Not on file  . Number of children: 2  . Years of education: 32  . Highest education level: Not on file  Occupational History    Employer: NOT EMPLOYED  . Occupation: Disabled   Tobacco Use  . Smoking status: Never Smoker  . Smokeless tobacco: Never Used  Substance and Sexual Activity  . Alcohol use: No    Alcohol/week: 0.0 standard drinks  . Drug use: No  . Sexual activity: Never    Birth control/protection: None  Other Topics Concern  . Not on file  Social History Narrative   Patient lives at [facility].     Patient is disabled.    Patient has a high school education.    Patient is divorced.    Patient has 2 children.    Patient is right handed.   Social Determinants of Health   Financial Resource Strain:   . Difficulty of Paying Living Expenses: Not on file  Food Insecurity:   . Worried About Charity fundraiser in the Last Year: Not on file  . Ran  Out of Food in the Last Year: Not on file  Transportation Needs:   . Lack of Transportation (Medical): Not on file  . Lack of Transportation (Non-Medical): Not on file  Physical Activity:   . Days of Exercise per Week: Not on file  . Minutes of Exercise per Session: Not on file  Stress:   . Feeling of Stress : Not on file  Social Connections:   . Frequency of Communication with Friends and Family: Not on file  . Frequency of Social Gatherings with Friends and Family: Not on file  . Attends Religious Services: Not on file  . Active Member of Clubs or Organizations: Not on file  . Attends Archivist Meetings: Not on file  . Marital Status: Not on file    Family History  Problem Relation Age of Onset  . Heart disease Other   . Cancer Other   . Hypertension Other   . Anesthesia problems Neg Hx   . Hypotension Neg Hx   . Malignant hyperthermia Neg Hx   . Pseudochol deficiency Neg Hx      Outpatient Encounter Medications as of 01/30/2020  Medication Sig  . amitriptyline (ELAVIL) 25 MG tablet Take 25 mg by mouth at bedtime.  Marland Kitchen atorvastatin (LIPITOR) 20 MG tablet Take 1 tablet (20 mg total) by mouth daily.  . calcium-vitamin D (OSCAL WITH D) 500-200 MG-UNIT tablet Take 1 tablet by mouth 2 (two) times daily.  . cholecalciferol (VITAMIN D) 1000 units tablet Take 1,000 Units by mouth daily.  . famotidine (PEPCID) 20 MG tablet Take 20 mg by mouth daily.  Marland Kitchen FLUoxetine (PROZAC) 20 MG capsule Take 20 mg by mouth daily.  Marland Kitchen glipiZIDE (GLUCOTROL XL) 5 MG 24 hr tablet Take 1 tablet (5 mg total) by mouth daily with breakfast.  . lisinopril (ZESTRIL) 5 MG tablet Take 1 tablet (5 mg total) by mouth daily.  . metFORMIN (GLUCOPHAGE-XR) 500 MG 24 hr tablet Take 500 mg by mouth 2 (two) times daily.  . Multiple Vitamins-Minerals (MULTIVITAMIN WITH MINERALS) tablet Take 1 tablet by mouth daily.  . ranitidine (ZANTAC) 150 MG tablet Take 150 mg by mouth 2 (two) times daily.  . traZODone (DESYREL) 50 MG tablet Take 50 mg by mouth at bedtime.  . [DISCONTINUED] lisinopril (PRINIVIL,ZESTRIL) 2.5 MG tablet Take 2.5 mg by mouth daily.   No facility-administered encounter medications on file as of 01/30/2020.    ALLERGIES: No Known Allergies  VACCINATION STATUS:  There is no immunization history on file for this patient.  Diabetes He presents for his follow-up diabetic visit. He has type 2 diabetes mellitus. Onset time: She was diagnosed at approximate age of 24 years. His disease course has been improving. There are no hypoglycemic associated symptoms. Pertinent negatives for hypoglycemia include no confusion, headaches, pallor or seizures. Associated symptoms include blurred vision. Pertinent negatives for diabetes include no chest pain, no fatigue, no polydipsia, no polyphagia, no polyuria and no weakness. There are no hypoglycemic complications. Symptoms are improving. (Obesity/sedentary life.)  Risk factors for coronary artery disease include diabetes mellitus, family history, male sex, hypertension, obesity and sedentary lifestyle. Current diabetic treatment includes oral agent (monotherapy). His weight is decreasing steadily. He is following a generally unhealthy diet. When asked about meal planning, he reported none. He never participates in exercise. (He presents with his meter showing blood glucose ranging between 112-146 since last visit.  This is significant improvement considering his recent A1c  > 14% on November 20, 2019.  No  hypoglycemia was documented or reported.) An ACE inhibitor/angiotensin II receptor blocker is being taken.  Hypertension This is a chronic problem. The current episode started more than 1 year ago. Associated symptoms include blurred vision. Pertinent negatives include no chest pain, headaches, neck pain, palpitations or shortness of breath. Risk factors for coronary artery disease include diabetes mellitus, male gender, obesity, sedentary lifestyle and family history. Past treatments include ACE inhibitors.     Review of Systems  Constitutional: Negative for chills, fatigue, fever and unexpected weight change.  HENT: Negative for dental problem, mouth sores and trouble swallowing.   Eyes: Positive for blurred vision. Negative for visual disturbance.  Respiratory: Negative for cough, choking, chest tightness, shortness of breath and wheezing.   Cardiovascular: Negative for chest pain, palpitations and leg swelling.  Gastrointestinal: Negative for abdominal distention, abdominal pain, constipation, diarrhea, nausea and vomiting.  Endocrine: Negative for polydipsia, polyphagia and polyuria.  Genitourinary: Negative for dysuria, flank pain, hematuria and urgency.  Musculoskeletal: Negative for back pain, gait problem, myalgias and neck pain.  Skin: Negative for pallor, rash and wound.  Neurological: Negative for seizures, syncope, weakness, numbness and  headaches.  Psychiatric/Behavioral: Negative for confusion and dysphoric mood.    Objective:    Vitals with BMI 01/30/2020 01/23/2020 11/15/2016  Height 5\' 7"  5\' 7"  5\' 8"   Weight 261 lbs 266 lbs 3 oz 259 lbs  BMI 40.87 09.81 19.1  Systolic 478 295 621  Diastolic 77 84 88  Pulse 93 88 106    BP 124/77   Pulse 93   Ht 5\' 7"  (1.702 m)   Wt 261 lb (118.4 kg)   SpO2 97%   BMI 40.88 kg/m   Wt Readings from Last 3 Encounters:  01/30/20 261 lb (118.4 kg)  01/23/20 266 lb 3.2 oz (120.7 kg)  11/15/16 259 lb (117.5 kg)     Physical Exam Constitutional:      General: He is not in acute distress.    Appearance: He is well-developed.  HENT:     Head: Normocephalic and atraumatic.  Neck:     Thyroid: No thyromegaly.     Trachea: No tracheal deviation.  Cardiovascular:     Rate and Rhythm: Normal rate.     Pulses:          Dorsalis pedis pulses are 1+ on the right side and 1+ on the left side.       Posterior tibial pulses are 1+ on the right side and 1+ on the left side.     Heart sounds: Normal heart sounds, S1 normal and S2 normal. No murmur heard.  No gallop.   Pulmonary:     Effort: Pulmonary effort is normal. No respiratory distress.     Breath sounds: No wheezing.  Abdominal:     General: There is no distension.     Palpations: Abdomen is soft.     Tenderness: There is no abdominal tenderness. There is no guarding.     Comments: Obese  Musculoskeletal:     Right shoulder: No swelling or deformity.     Cervical back: Normal range of motion and neck supple.     Comments: Poor feet hygiene, monofilament testing normal , dorsalis pedis and posterior tibial arterial pulses are normal.  Skin:    General: Skin is warm and dry.     Findings: No rash.     Nails: There is no clubbing.  Neurological:     Mental Status: He is alert and oriented to person,  place, and time.     Cranial Nerves: No cranial nerve deficit.     Sensory: No sensory deficit.     Gait: Gait normal.      Deep Tendon Reflexes: Reflexes are normal and symmetric.  Psychiatric:        Speech: Speech normal.        Behavior: Behavior normal. Behavior is cooperative.        Thought Content: Thought content normal.        Judgment: Judgment normal.      CMP     Component Value Date/Time   NA 140 02/06/2016 1850   K 4.5 02/06/2016 1850   CL 103 02/06/2016 1850   CO2 24 02/06/2016 1850   GLUCOSE 74 02/06/2016 1850   BUN 13 01/02/2020 0000   CREATININE 1.0 01/02/2020 0000   CREATININE 1.56 (H) 02/06/2016 1850   CALCIUM 10.2 02/06/2016 1850   PROT 7.8 02/06/2016 1850   ALBUMIN 4.7 02/06/2016 1850   AST 30 02/06/2016 1850   ALT 35 02/06/2016 1850   ALKPHOS 39 02/06/2016 1850   BILITOT 0.6 02/06/2016 1850   GFRNONAA 76 01/02/2020 0000   GFRAA 88 01/02/2020 0000     Diabetic Labs (most recent): Lab Results  Component Value Date   HGBA1C >14 11/20/2019     Lipid Panel ( most recent) Lipid Panel     Component Value Date/Time   CHOL 165 01/02/2020 0000   TRIG 236 (A) 01/02/2020 0000   HDL 41 01/02/2020 0000   LDLCALC 85 01/02/2020 0000      Assessment & Plan:   1. Uncontrolled type 2 diabetes mellitus with hypoglycemia without coma ([place])  - Joe [name] has currently uncontrolled symptomatic type 2 DM since  69 years of age.  He presents with his meter showing blood glucose ranging between 112-146 since last visit.  This is significant improvement considering his recent A1c  > 14% on November 20, 2019.  No hypoglycemia was documented or reported. - Recent labs reviewed. - I had a long discussion with him about the progressive nature of diabetes and the pathology behind its complications. -his diabetes is complicated by obesity/sedentary life and he remains at a high risk for more acute and chronic complications which include CAD, CVA, CKD, retinopathy, and neuropathy. These are all discussed in detail with him.  - I have counseled him on diet  and weight management  by  adopting a carbohydrate restricted/protein rich diet. Patient is encouraged to switch to  unprocessed or minimally processed     complex starch and increased protein intake (animal or plant source), fruits, and vegetables. -  he is advised to stick to a routine mealtimes to eat 3 meals  a day and avoid unnecessary snacks ( to snack only to correct hypoglycemia).   - he  admits there is a room for improvement in his diet and drink choices. -  Suggestion is made for him to avoid simple carbohydrates  from his diet including Cakes, Sweet Desserts / Pastries, Ice Cream, Soda (diet and regular), Sweet Tea, Candies, Chips, Cookies, Sweet Pastries,  Store Bought Juices, Alcohol in Excess of  1-2 drinks a day, Artificial Sweeteners, Coffee Creamer, and "Sugar-free" Products. This will help patient to have stable blood glucose profile and potentially avoid unintended weight gain.  - he will be scheduled with Jearld Fenton, RDN, CDE for diabetes education.  - I have approached him with the following individualized plan to manage  his diabetes and  patient agrees:   -Based on his presentation with near target glycemic profile, he will not need insulin treatment at this time. -He is responding to his recent addition of glipizide, advised to continue glipizide 5 mg XL p.o. daily at breakfast.  He is advised to continue Metformin 500 mg p.o. twice daily-with breakfast and supper. -He agrees to continue to monitor blood glucose twice a day daily before breakfast and at bedtime. - he is encouraged to call clinic for blood glucose levels less than 70 or above 200 mg /dl.  - he will be considered for incretin therapy as appropriate next visit.  - Specific targets for  A1c;  LDL, HDL,  and Triglycerides were discussed with the patient.  2) Blood Pressure /Hypertension:  his blood pressure is  controlled to target.   he is advised to increase his lisinopril to 5 mg p.o. daily at breakfast.   3)  Lipids/Hyperlipidemia:   Review of his recent lipid panel showed  controlled  LDL at 85 .  Patient is not on statin.  I discussed and added atorvastatin 20 mg p.o. nightly.  Side effects and precautions discussed with him.     4)  Weight/Diet:  Body mass index is 40.88 kg/m.  -   clearly complicating his diabetes care.   he is  a candidate for weight loss. I discussed with him the fact that loss of 5 - 10% of his  current body weight will have the most impact on his diabetes management.  Exercise, and detailed carbohydrates information provided  -  detailed on discharge instructions.  5) Chronic Care/Health Maintenance:  -he  is on ACEI medications and  is encouraged to initiate and continue to follow up with Ophthalmology, Dentist,  Podiatrist at least yearly or according to recommendations, and advised to   stay away from smoking. I have recommended yearly flu vaccine and pneumonia vaccine at least every 5 years; moderate intensity exercise for up to 150 minutes weekly; and  sleep for at least 7 hours a day.  - he is  advised to maintain close follow up with Sharilyn Sites, MD for primary care needs, as well as his other providers for optimal and coordinated care.   - Time spent on this patient care encounter:  35 min, of which > 50% was spent in  counseling and the rest reviewing his blood glucose logs , discussing his hypoglycemia and hyperglycemia episodes, reviewing his current and  previous labs / studies  ( including abstraction from other facilities) and medications  doses and developing a  long term treatment plan and documenting his care.   Please refer to Patient Instructions for Blood Glucose Monitoring and Insulin/Medications Dosing Guide"  in media tab for additional information. Please  also refer to " Patient Self Inventory" in the Media  tab for reviewed elements of pertinent patient history.  Cyndi Bender participated in the discussions, expressed understanding, and voiced agreement  with the above plans.  All questions were answered to his satisfaction. he is encouraged to contact clinic should he have any questions or concerns prior to his return visit.  Follow up plan: - Return in about 5 weeks (around 03/05/2020) for Bring Meter and Logs- A1c in Office.  Glade Lloyd, MD [facility] [address] [place], [place] [address] Phone: [phone]  Fax: [phone]    01/30/2020, 2:08 PM  This note was partially dictated with voice recognition software. Similar sounding words can be  transcribed inadequately or may not  be corrected upon review.

## 2020-01-30 NOTE — Patient Instructions (Signed)

## 2020-03-05 ENCOUNTER — Encounter: Payer: Self-pay | Admitting: Nurse Practitioner

## 2020-03-05 ENCOUNTER — Ambulatory Visit (INDEPENDENT_AMBULATORY_CARE_PROVIDER_SITE_OTHER): Payer: Medicare Other | Admitting: Nurse Practitioner

## 2020-03-05 ENCOUNTER — Other Ambulatory Visit: Payer: Self-pay

## 2020-03-05 VITALS — BP 129/68 | HR 94 | Ht 67.0 in | Wt 261.0 lb

## 2020-03-05 DIAGNOSIS — E782 Mixed hyperlipidemia: Secondary | ICD-10-CM | POA: Diagnosis not present

## 2020-03-05 DIAGNOSIS — E11649 Type 2 diabetes mellitus with hypoglycemia without coma: Secondary | ICD-10-CM

## 2020-03-05 DIAGNOSIS — E559 Vitamin D deficiency, unspecified: Secondary | ICD-10-CM | POA: Diagnosis not present

## 2020-03-05 DIAGNOSIS — I10 Essential (primary) hypertension: Secondary | ICD-10-CM

## 2020-03-05 LAB — POCT GLYCOSYLATED HEMOGLOBIN (HGB A1C): HbA1c, POC (controlled diabetic range): 7.1 % — AB (ref 0.0–7.0)

## 2020-03-05 NOTE — Progress Notes (Addendum)
03/05/2020, 1:31 PM  Endocrinology follow-up note   Subjective:    Patient ID: Joe [name], male    DOB: [birth year].  Joe [name] is being seen in follow-up after he was seen in consultation for management of currently uncontrolled symptomatic diabetes requested by  Sharilyn Sites, MD.   Past Medical History:  Diagnosis Date  . Anxiety   . Arthritis    osteoarthritis  . Borderline diabetes   . Chronic pain   . Depression   . DJD (degenerative joint disease)   . GERD (gastroesophageal reflux disease)   . H/O renal calculi 1992  . Hypertension   . 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 Muscle weakness   . Obesity   . Psychosis ([place])   . Sleep apnea    STOP BANG; 7;Has study and was positive but have not gotten CPAP yet.    Past Surgical History:  Procedure Laterality Date  . APPENDECTOMY  81    mmh  . CATARACT EXTRACTION W/PHACO  12/09/2010   Procedure: CATARACT EXTRACTION PHACO AND INTRAOCULAR LENS PLACEMENT (IOC);  Surgeon: Tonny Branch;  Location: [facility];  Service: Ophthalmology;  Laterality: Right;  CDE: 36.21  . CATARACT EXTRACTION W/PHACO  10/24/2011   Procedure: CATARACT EXTRACTION PHACO AND INTRAOCULAR LENS PLACEMENT (IOC);  Surgeon: Tonny Branch, MD;  Location: [facility];  Service: Ophthalmology;  Laterality: Left;  CDE 10.35  . COLONOSCOPY  09/20/2011   Procedure: COLONOSCOPY;  Surgeon: Jamesetta So, MD;  Location: [facility];  Service: Gastroenterology;  Laterality: N/A;  . CYSTOSCOPY     with kidney stone removal-mmh  . HERNIA REPAIR     umbilical hernia repair-mmh  . INCISIONAL HERNIA REPAIR N/A 09/16/2015   Procedure: HERNIA REPAIR INCISIONAL WITH MESH;  Surgeon: Aviva Signs, MD;  Location: [facility];  Service: General;  Laterality: N/A;  Umbilicus  . INSERTION OF MESH N/A 09/16/2015   Procedure: INSERTION OF MESH UMBILICUS;  Surgeon: Aviva Signs, MD;  Location: [facility];  Service: General;  Laterality:  N/A;    Social History   Socioeconomic History  . Marital status: Divorced    Spouse name: Not on file  . Number of children: 2  . Years of education: 60  . Highest education level: Not on file  Occupational History    Employer: NOT EMPLOYED  . Occupation: Disabled   Tobacco Use  . Smoking status: Never Smoker  . Smokeless tobacco: Never Used  Substance and Sexual Activity  . Alcohol use: No    Alcohol/week: 0.0 standard drinks  . Drug use: No  . Sexual activity: Never    Birth control/protection: None  Other Topics Concern  . Not on file  Social History Narrative   Patient lives at [facility].     Patient is disabled.    Patient has a high school education.    Patient is divorced.    Patient has 2 children.    Patient is right handed.   Social Determinants of Health   Financial Resource Strain:   . Difficulty of Paying Living Expenses: Not on file  Food Insecurity:   . Worried About Charity fundraiser in the Last Year: Not on file  . Ran Out of  Food in the Last Year: Not on file  Transportation Needs:   . Lack of Transportation (Medical): Not on file  . Lack of Transportation (Non-Medical): Not on file  Physical Activity:   . Days of Exercise per Week: Not on file  . Minutes of Exercise per Session: Not on file  Stress:   . Feeling of Stress : Not on file  Social Connections:   . Frequency of Communication with Friends and Family: Not on file  . Frequency of Social Gatherings with Friends and Family: Not on file  . Attends Religious Services: Not on file  . Active Member of Clubs or Organizations: Not on file  . Attends Archivist Meetings: Not on file  . Marital Status: Not on file    Family History  Problem Relation Age of Onset  . Heart disease Other   . Cancer Other   . Hypertension Other   . Anesthesia problems Neg Hx   . Hypotension Neg Hx   . Malignant hyperthermia Neg Hx   . Pseudochol deficiency Neg Hx     Outpatient  Encounter Medications as of 03/05/2020  Medication Sig  . amitriptyline (ELAVIL) 25 MG tablet Take 25 mg by mouth at bedtime.  Marland Kitchen atorvastatin (LIPITOR) 20 MG tablet Take 1 tablet (20 mg total) by mouth daily.  . calcium-vitamin D (OSCAL WITH D) 500-200 MG-UNIT tablet Take 1 tablet by mouth 2 (two) times daily.  . cholecalciferol (VITAMIN D) 1000 units tablet Take 1,000 Units by mouth daily.  . famotidine (PEPCID) 20 MG tablet Take 20 mg by mouth daily.  Marland Kitchen FLUoxetine (PROZAC) 20 MG capsule Take 20 mg by mouth daily.  Marland Kitchen glipiZIDE (GLUCOTROL XL) 5 MG 24 hr tablet Take 1 tablet (5 mg total) by mouth daily with breakfast.  . lisinopril (ZESTRIL) 5 MG tablet Take 1 tablet (5 mg total) by mouth daily.  . metFORMIN (GLUCOPHAGE-XR) 500 MG 24 hr tablet Take 500 mg by mouth 2 (two) times daily.  . Multiple Vitamins-Minerals (MULTIVITAMIN WITH MINERALS) tablet Take 1 tablet by mouth daily.  . traZODone (DESYREL) 50 MG tablet Take 50 mg by mouth at bedtime.  . ranitidine (ZANTAC) 150 MG tablet Take 150 mg by mouth 2 (two) times daily. (Patient not taking: Reported on 03/05/2020)   No facility-administered encounter medications on file as of 03/05/2020.    ALLERGIES: No Known Allergies  VACCINATION STATUS:  There is no immunization history on file for this patient.  Diabetes He presents for his follow-up diabetic visit. He has type 2 diabetes mellitus. Onset time: She was diagnosed at approximate age of 80 years. His disease course has been improving. There are no hypoglycemic associated symptoms. Pertinent negatives for hypoglycemia include no confusion, headaches, pallor or seizures. Associated symptoms include blurred vision. Pertinent negatives for diabetes include no chest pain, no fatigue, no polydipsia, no polyphagia, no polyuria and no weakness. There are no hypoglycemic complications. Symptoms are improving. Risk factors for coronary artery disease include diabetes mellitus, family history, male  sex, hypertension, obesity, sedentary lifestyle and dyslipidemia. Current diabetic treatment includes oral agent (dual therapy). He is compliant with treatment most of the time. His weight is fluctuating minimally. He is following a generally unhealthy diet. When asked about meal planning, he reported none. He rarely participates in exercise. His home blood glucose trend is decreasing steadily. His breakfast blood glucose range is generally 110-130 mg/dl. (He presents today, with no meter or logs to review.  His POCT A1c today is  7.1%, drastically improved since last visit of >14%.  He denies any hypoglycemia.  He has changed his diet since last visit.) An ACE inhibitor/angiotensin II receptor blocker is being taken. He does not see a podiatrist.Eye exam is current.  Hypertension This is a chronic problem. The current episode started more than 1 year ago. The problem has been gradually improving since onset. The problem is controlled. Associated symptoms include blurred vision. Pertinent negatives include no chest pain, headaches, neck pain, palpitations or shortness of breath. There are no associated agents to hypertension. Risk factors for coronary artery disease include diabetes mellitus, male gender, obesity, sedentary lifestyle, family history and dyslipidemia. Past treatments include ACE inhibitors. The current treatment provides moderate improvement. There are no compliance problems.  Identifiable causes of hypertension include chronic renal disease and sleep apnea.     Review of systems  Constitutional: + Minimally fluctuating body weight,  current Body mass index is 40.88 kg/m. , no fatigue, no subjective hyperthermia, no subjective hypothermia Eyes: no blurry vision, no xerophthalmia ENT: no sore throat, no nodules palpated in throat, no dysphagia/odynophagia, no hoarseness Cardiovascular: no chest pain, no shortness of breath, no palpitations, no leg swelling Respiratory: no cough, no  shortness of breath Gastrointestinal: no nausea/vomiting/diarrhea Musculoskeletal: no muscle/joint aches Skin: no rashes, no hyperemia Neurological: no tremors, no numbness, no tingling, no dizziness Psychiatric: no depression, no anxiety    Objective:     BP 129/68 (BP Location: Right Arm)   Pulse 94   Ht 5\' 7"  (1.702 m)   Wt 261 lb (118.4 kg)   BMI 40.88 kg/m   Wt Readings from Last 3 Encounters:  03/05/20 261 lb (118.4 kg)  01/30/20 261 lb (118.4 kg)  01/23/20 266 lb 3.2 oz (120.7 kg)    BP Readings from Last 3 Encounters:  03/05/20 129/68  01/30/20 124/77  01/23/20 132/84    Physical Exam- Limited  Constitutional:  Body mass index is 40.88 kg/m. , not in acute distress, slightly anxious state of mind Eyes:  EOMI, no exophthalmos Neck: Supple Thyroid: No gross goiter Cardiovascular: RRR, no murmers, rubs, or gallops, no edema Respiratory: Adequate breathing efforts, no crackles, rales, rhonchi, or wheezing Musculoskeletal: no gross deformities, strength intact in all four extremities, no gross restriction of joint movements Skin:  no rashes, no hyperemia Neurological: mild tremor at rest   POCT ABI Results 03/05/20   Right ABI:  1.33      Left ABI:  1.36  Right leg systolic / diastolic: 175/10 mmHg Left leg systolic / diastolic: 258/52 mmHg  Arm systolic / diastolic: 778/24 mmHG  Detailed report will be scanned into patient chart. CMP     Component Value Date/Time   NA 140 02/06/2016 1850   K 4.5 02/06/2016 1850   CL 103 02/06/2016 1850   CO2 24 02/06/2016 1850   GLUCOSE 74 02/06/2016 1850   BUN 13 01/02/2020 0000   CREATININE 1.0 01/02/2020 0000   CREATININE 1.56 (H) 02/06/2016 1850   CALCIUM 10.2 02/06/2016 1850   PROT 7.8 02/06/2016 1850   ALBUMIN 4.7 02/06/2016 1850   AST 30 02/06/2016 1850   ALT 35 02/06/2016 1850   ALKPHOS 39 02/06/2016 1850   BILITOT 0.6 02/06/2016 1850   GFRNONAA 76 01/02/2020 0000   GFRAA 88 01/02/2020 0000      Diabetic Labs (most recent): Lab Results  Component Value Date   HGBA1C 7.1 (A) 03/05/2020   HGBA1C >14 11/20/2019     Lipid Panel ( most recent)  Lipid Panel     Component Value Date/Time   CHOL 165 01/02/2020 0000   TRIG 236 (A) 01/02/2020 0000   HDL 41 01/02/2020 0000   LDLCALC 85 01/02/2020 0000      Assessment & Plan:   1) Uncontrolled type 2 diabetes mellitus with hypoglycemia without coma (HCC)  - [place] has currently uncontrolled symptomatic type 2 DM since  69 years of age.  He presents today, with no meter or logs to review.  His POCT A1c today is 7.1%, drastically improved since last visit of >14%.  He denies any hypoglycemia.  He has changed his diet since last visit.  - Recent labs reviewed.  - I had a long discussion with him about the progressive nature of diabetes and the pathology behind its complications. -his diabetes is complicated by obesity/sedentary life and he remains at a high risk for more acute and chronic complications which include CAD, CVA, CKD, retinopathy, and neuropathy. These are all discussed in detail with him.  - Nutritional counseling repeated at each appointment due to patients tendency to fall back in to old habits.  - The patient admits there is a room for improvement in their diet and drink choices. -  Suggestion is made for the patient to avoid simple carbohydrates from their diet including Cakes, Sweet Desserts / Pastries, Ice Cream, Soda (diet and regular), Sweet Tea, Candies, Chips, Cookies, Sweet Pastries,  Store Bought Juices, Alcohol in Excess of  1-2 drinks a day, Artificial Sweeteners, Coffee Creamer, and "Sugar-free" Products. This will help patient to have stable blood glucose profile and potentially avoid unintended weight gain.   - I encouraged the patient to switch to  unprocessed or minimally processed complex starch and increased protein intake (animal or plant source), fruits, and vegetables.   - Patient is  advised to stick to a routine mealtimes to eat 3 meals  a day and avoid unnecessary snacks ( to snack only to correct hypoglycemia).  - I have approached him with the following individualized plan to manage  his diabetes and patient agrees:   -Based on his improved glycemic profile overall, he will not need insulin treatment at this time.  -He is responding to his recent addition of glipizide, advised to continue glipizide 5 mg XL p.o. daily at breakfast.  He is advised to continue Metformin 500 mg p.o. twice daily-with breakfast and supper.  -He is encouraged to continue monitoring blood glucose twice daily, before breakfast and before bed, and to call the clinic if he has readings less than 70 or greater than 200 for 3 tests in a row.  - he will be considered for incretin therapy as appropriate next visit.  - Specific targets for  A1c;  LDL, HDL,  and Triglycerides were discussed with the patient.  2) Blood Pressure /Hypertension:  His blood pressure is controlled to target.  He is advised to continue Lisinopril 5 mg po daily.  3) Lipids/Hyperlipidemia:    His most recent lipid panel from 01/02/20 shows controlled LDL at 85 and elevated triglycerides of 236.  He is advised to continue Lipitor 20 mg po daily at bedtime.  Side effects and precautions discussed with him.  4)  Weight/Diet:  His Body mass index is 40.88 kg/m.  -   clearly complicating his diabetes care.   he is  a candidate for weight loss. I discussed with him the fact that loss of 5 - 10% of his  current body weight will have the  most impact on his diabetes management.  Exercise, and detailed carbohydrates information provided  -  detailed on discharge instructions.  5) Chronic Care/Health Maintenance: -he  is on ACEI medications and Statin medications and is encouraged to initiate and continue to follow up with Ophthalmology, Dentist,  Podiatrist at least yearly or according to recommendations, and advised to   stay away from  smoking. I have recommended yearly flu vaccine and pneumonia vaccine at least every 5 years; moderate intensity exercise for up to 150 minutes weekly; and  sleep for at least 7 hours a day.  - he is  advised to maintain close follow up with Sharilyn Sites, MD for primary care needs, as well as his other providers for optimal and coordinated care.   - Time spent on this patient care encounter:  35 min, of which > 50% was spent in  counseling and the rest reviewing his blood glucose logs , discussing his hypoglycemia and hyperglycemia episodes, reviewing his current and  previous labs / studies  ( including abstraction from other facilities) and medications  doses and developing a  long term treatment plan and documenting his care.   Please refer to Patient Instructions for Blood Glucose Monitoring and Insulin/Medications Dosing Guide"  in media tab for additional information. Please  also refer to " Patient Self Inventory" in the Media  tab for reviewed elements of pertinent patient history.  Heath Gold participated in the discussions, expressed understanding, and voiced agreement with the above plans.  All questions were answered to his satisfaction. he is encouraged to contact clinic should he have any questions or concerns prior to his return visit.  Follow up plan: - Return in about 4 months (around 07/04/2020) for Diabetes follow up with A1c in office, Previsit labs, Bring glucometer and logs.  Rayetta Pigg, [facility] [address] [place], [place] [address] Phone: [phone] Fax: [phone]   03/05/2020, 1:31 PM

## 2020-03-05 NOTE — Patient Instructions (Signed)

## 2020-04-23 ENCOUNTER — Other Ambulatory Visit: Payer: Self-pay | Admitting: "Endocrinology

## 2020-05-18 ENCOUNTER — Other Ambulatory Visit: Payer: Self-pay | Admitting: "Endocrinology

## 2020-06-25 ENCOUNTER — Other Ambulatory Visit: Payer: Self-pay | Admitting: "Endocrinology

## 2020-07-04 LAB — COMPREHENSIVE METABOLIC PANEL
ALT: 23 IU/L (ref 0–44)
AST: 18 IU/L (ref 0–40)
Albumin/Globulin Ratio: 2.1 (ref 1.2–2.2)
Albumin: 4.8 g/dL (ref 3.8–4.8)
Alkaline Phosphatase: 53 IU/L (ref 44–121)
BUN/Creatinine Ratio: 17 (ref 10–24)
BUN: 17 mg/dL (ref 8–27)
Bilirubin Total: 0.5 mg/dL (ref 0.0–1.2)
CO2: 20 mmol/L (ref 20–29)
Calcium: 9.6 mg/dL (ref 8.6–10.2)
Chloride: 99 mmol/L (ref 96–106)
Creatinine, Ser: 0.99 mg/dL (ref 0.76–1.27)
Globulin, Total: 2.3 g/dL (ref 1.5–4.5)
Glucose: 144 mg/dL — ABNORMAL HIGH (ref 65–99)
Potassium: 4.8 mmol/L (ref 3.5–5.2)
Sodium: 135 mmol/L (ref 134–144)
Total Protein: 7.1 g/dL (ref 6.0–8.5)
eGFR: 82 mL/min/{1.73_m2} (ref >59)

## 2020-07-06 ENCOUNTER — Ambulatory Visit: Payer: Medicare Other | Admitting: Nurse Practitioner

## 2020-07-06 NOTE — Patient Instructions (Incomplete)

## 2020-07-21 NOTE — Patient Instructions (Signed)

## 2020-07-22 ENCOUNTER — Encounter: Payer: Self-pay | Admitting: Nurse Practitioner

## 2020-07-22 ENCOUNTER — Ambulatory Visit (INDEPENDENT_AMBULATORY_CARE_PROVIDER_SITE_OTHER): Payer: Medicare Other | Admitting: Nurse Practitioner

## 2020-07-22 ENCOUNTER — Other Ambulatory Visit: Payer: Self-pay

## 2020-07-22 VITALS — BP 146/80 | HR 101 | Ht 67.0 in | Wt 269.0 lb

## 2020-07-22 DIAGNOSIS — E11649 Type 2 diabetes mellitus with hypoglycemia without coma: Secondary | ICD-10-CM | POA: Diagnosis not present

## 2020-07-22 DIAGNOSIS — E782 Mixed hyperlipidemia: Secondary | ICD-10-CM | POA: Diagnosis not present

## 2020-07-22 DIAGNOSIS — I10 Essential (primary) hypertension: Secondary | ICD-10-CM

## 2020-07-22 DIAGNOSIS — E559 Vitamin D deficiency, unspecified: Secondary | ICD-10-CM

## 2020-07-22 LAB — POCT GLYCOSYLATED HEMOGLOBIN (HGB A1C): HbA1c, POC (controlled diabetic range): 7.1 % — AB (ref 0.0–7.0)

## 2020-07-22 NOTE — Progress Notes (Signed)
07/22/2020, 1:29 PM  Endocrinology follow-up note   Subjective:    Patient ID: Joe [name], male    DOB: 05-May-1950.  Joe [name] is being seen in follow-up after he was seen in consultation for management of currently uncontrolled symptomatic diabetes requested by  Sharilyn Sites, MD.   Past Medical History:  Diagnosis Date  . Anxiety   . Arthritis    osteoarthritis  . Borderline diabetes   . Chronic pain   . Depression   . DJD (degenerative joint disease)   . GERD (gastroesophageal reflux disease)   . H/O renal calculi 1992  . Hypertension   . 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 Muscle weakness   . Obesity   . Psychosis ([place])   . Sleep apnea    STOP BANG; 7;Has study and was positive but have not gotten CPAP yet.    Past Surgical History:  Procedure Laterality Date  . APPENDECTOMY  81    mmh  . CATARACT EXTRACTION W/PHACO  12/09/2010   Procedure: CATARACT EXTRACTION PHACO AND INTRAOCULAR LENS PLACEMENT (IOC);  Surgeon: Tonny Branch;  Location: [facility];  Service: Ophthalmology;  Laterality: Right;  CDE: 36.21  . CATARACT EXTRACTION W/PHACO  10/24/2011   Procedure: CATARACT EXTRACTION PHACO AND INTRAOCULAR LENS PLACEMENT (IOC);  Surgeon: Tonny Branch, MD;  Location: [facility];  Service: Ophthalmology;  Laterality: Left;  CDE 10.35  . COLONOSCOPY  09/20/2011   Procedure: COLONOSCOPY;  Surgeon: Jamesetta So, MD;  Location: [facility];  Service: Gastroenterology;  Laterality: N/A;  . CYSTOSCOPY     with kidney stone removal-mmh  . HERNIA REPAIR     umbilical hernia repair-mmh  . INCISIONAL HERNIA REPAIR N/A 09/16/2015   Procedure: HERNIA REPAIR INCISIONAL WITH MESH;  Surgeon: Aviva Signs, MD;  Location: [facility];  Service: General;  Laterality: N/A;  Umbilicus  . INSERTION OF MESH N/A 09/16/2015   Procedure: INSERTION OF MESH UMBILICUS;  Surgeon: Aviva Signs, MD;  Location: [facility];  Service: General;  Laterality:  N/A;    Social History   Socioeconomic History  . Marital status: Divorced    Spouse name: Not on file  . Number of children: 2  . Years of education: 75  . Highest education level: Not on file  Occupational History    Employer: NOT EMPLOYED  . Occupation: Disabled   Tobacco Use  . Smoking status: Never Smoker  . Smokeless tobacco: Never Used  Substance and Sexual Activity  . Alcohol use: No    Alcohol/week: 0.0 standard drinks  . Drug use: No  . Sexual activity: Never    Birth control/protection: None  Other Topics Concern  . Not on file  Social History Narrative   Patient lives at [facility].     Patient is disabled.    Patient has a high school education.    Patient is divorced.    Patient has 2 children.    Patient is right handed.   Social Determinants of Health   Financial Resource Strain: Not on file  Food Insecurity: Not on file  Transportation Needs: Not on file  Physical Activity: Not on file  Stress: Not on file  Social Connections: Not on file  Family History  Problem Relation Age of Onset  . Heart disease Other   . Cancer Other   . Hypertension Other   . Anesthesia problems Neg Hx   . Hypotension Neg Hx   . Malignant hyperthermia Neg Hx   . Pseudochol deficiency Neg Hx     Outpatient Encounter Medications as of 07/22/2020  Medication Sig  . amitriptyline (ELAVIL) 25 MG tablet Take 25 mg by mouth at bedtime.  Marland Kitchen atorvastatin (LIPITOR) 20 MG tablet TAKE (1) TABLET BY MOUTH ONCE DAILY.  . calcium-vitamin D (OSCAL WITH D) 500-200 MG-UNIT tablet Take 1 tablet by mouth 2 (two) times daily.  . cholecalciferol (VITAMIN D) 1000 units tablet Take 1,000 Units by mouth daily.  . famotidine (PEPCID) 20 MG tablet Take 20 mg by mouth daily.  Marland Kitchen FLUoxetine (PROZAC) 20 MG capsule Take 20 mg by mouth daily.  Marland Kitchen glipiZIDE (GLUCOTROL XL) 5 MG 24 hr tablet TAKE (1) TABLET BY MOUTH ONCE DAILY WITH BREAKFAST.  Marland Kitchen lisinopril (ZESTRIL) 5 MG tablet TAKE (1) TABLET  BY MOUTH ONCE DAILY.  . metFORMIN (GLUCOPHAGE-XR) 500 MG 24 hr tablet Take 500 mg by mouth 2 (two) times daily.  . Multiple Vitamins-Minerals (MULTIVITAMIN WITH MINERALS) tablet Take 1 tablet by mouth daily.  . traZODone (DESYREL) 50 MG tablet Take 50 mg by mouth at bedtime.  . [DISCONTINUED] ranitidine (ZANTAC) 150 MG tablet Take 150 mg by mouth 2 (two) times daily. (Patient not taking: Reported on 03/05/2020)   No facility-administered encounter medications on file as of 07/22/2020.    ALLERGIES: No Known Allergies  VACCINATION STATUS:  There is no immunization history on file for this patient.  Diabetes He presents for his follow-up diabetic visit. He has type 2 diabetes mellitus. Onset time: She was diagnosed at approximate age of 60 years. His disease course has been stable. There are no hypoglycemic associated symptoms. Pertinent negatives for hypoglycemia include no confusion, headaches, pallor or seizures. Associated symptoms include blurred vision. Pertinent negatives for diabetes include no chest pain, no fatigue, no polydipsia, no polyphagia, no polyuria and no weakness. There are no hypoglycemic complications. Symptoms are stable. Diabetic complications include nephropathy. Risk factors for coronary artery disease include diabetes mellitus, family history, male sex, hypertension, obesity, sedentary lifestyle and dyslipidemia. Current diabetic treatment includes oral agent (dual therapy). He is compliant with treatment most of the time. His weight is fluctuating minimally. He is following a generally healthy diet. When asked about meal planning, he reported none. He participates in exercise intermittently. His home blood glucose trend is fluctuating minimally. His overall blood glucose range is 140-180 mg/dl. (He presents today with his meter and logs showing stable glycemic profile overall.   His POCT A1c today is 7.1%, unchanged from previous visit.  He does have mild hypoglycemia noted,  likely due to inadequate carb intake or meal timing.) An ACE inhibitor/angiotensin II receptor blocker is being taken. He does not see a podiatrist.Eye exam is current.  Hypertension This is a chronic problem. The current episode started more than 1 year ago. The problem has been gradually improving since onset. The problem is controlled. Associated symptoms include blurred vision. Pertinent negatives include no chest pain, headaches, neck pain, palpitations or shortness of breath. There are no associated agents to hypertension. Risk factors for coronary artery disease include diabetes mellitus, male gender, obesity, sedentary lifestyle, family history and dyslipidemia. Past treatments include ACE inhibitors. The current treatment provides moderate improvement. There are no compliance problems.  Hypertensive end-organ damage includes  kidney disease. Identifiable causes of hypertension include chronic renal disease and sleep apnea.     Review of systems  Constitutional: + Minimally fluctuating body weight,  current Body mass index is 42.13 kg/m. , no fatigue, no subjective hyperthermia, no subjective hypothermia Eyes: no blurry vision, no xerophthalmia ENT: no sore throat, no nodules palpated in throat, no dysphagia/odynophagia, no hoarseness Cardiovascular: no chest pain, no shortness of breath, no palpitations, no leg swelling Respiratory: no cough, no shortness of breath Gastrointestinal: no nausea/vomiting/diarrhea Musculoskeletal: no muscle/joint aches Skin: no rashes, no hyperemia Neurological: no tremors, no numbness, no tingling, no dizziness Psychiatric: no depression, no anxiety   Objective:     BP (!) 146/80 (BP Location: Right Arm, Patient Position: Sitting)   Pulse (!) 101   Ht 5\' 7"  (1.702 m)   Wt 269 lb (122 kg)   BMI 42.13 kg/m   Wt Readings from Last 3 Encounters:  07/22/20 269 lb (122 kg)  03/05/20 261 lb (118.4 kg)  01/30/20 261 lb (118.4 kg)    BP Readings from  Last 3 Encounters:  07/22/20 (!) 146/80  03/05/20 129/68  01/30/20 124/77     Physical Exam- Limited  Constitutional:  Body mass index is 42.13 kg/m. , not in acute distress, normal state of mind Eyes:  EOMI, no exophthalmos Neck: Supple Cardiovascular: RRR, no murmers, rubs, or gallops, no edema Respiratory: Adequate breathing efforts, no crackles, rales, rhonchi, or wheezing Musculoskeletal: no gross deformities, strength intact in all four extremities, no gross restriction of joint movements Skin:  no rashes, no hyperemia Neurological: no tremor with outstretched hands    CMP     Component Value Date/Time   NA 135 07/03/2020 0858   K 4.8 07/03/2020 0858   CL 99 07/03/2020 0858   CO2 20 07/03/2020 0858   GLUCOSE 144 (H) 07/03/2020 0858   GLUCOSE 74 02/06/2016 1850   BUN 17 07/03/2020 0858   CREATININE 0.99 07/03/2020 0858   CALCIUM 9.6 07/03/2020 0858   PROT 7.1 07/03/2020 0858   ALBUMIN 4.8 07/03/2020 0858   AST 18 07/03/2020 0858   ALT 23 07/03/2020 0858   ALKPHOS 53 07/03/2020 0858   BILITOT 0.5 07/03/2020 0858   GFRNONAA 76 01/02/2020 0000   GFRAA 88 01/02/2020 0000     Diabetic Labs (most recent): Lab Results  Component Value Date   HGBA1C 7.1 (A) 07/22/2020   HGBA1C 7.1 (A) 03/05/2020   HGBA1C >14 11/20/2019     Lipid Panel ( most recent) Lipid Panel     Component Value Date/Time   CHOL 165 01/02/2020 0000   TRIG 236 (A) 01/02/2020 0000   HDL 41 01/02/2020 0000   LDLCALC 85 01/02/2020 0000      Assessment & Plan:   1) Uncontrolled type 2 diabetes mellitus with hypoglycemia without coma (HCC)  - Joe [name] has currently uncontrolled symptomatic type 2 DM since 70 years of age.  He presents today with his meter and logs showing stable glycemic profile overall.   His POCT A1c today is 7.1%, unchanged from previous visit.  He does have mild hypoglycemia noted, likely due to inadequate carb intake or meal timing.  - Recent labs reviewed.  -  I had a long discussion with him about the progressive nature of diabetes and the pathology behind its complications. -his diabetes is complicated by obesity/sedentary life and he remains at a high risk for more acute and chronic complications which include CAD, CVA, CKD, retinopathy, and neuropathy. These are all  discussed in detail with him.  - Nutritional counseling repeated at each appointment due to patients tendency to fall back in to old habits.  - The patient admits there is a room for improvement in their diet and drink choices. -  Suggestion is made for the patient to avoid simple carbohydrates from their diet including Cakes, Sweet Desserts / Pastries, Ice Cream, Soda (diet and regular), Sweet Tea, Candies, Chips, Cookies, Sweet Pastries,  Store Bought Juices, Alcohol in Excess of  1-2 drinks a day, Artificial Sweeteners, Coffee Creamer, and "Sugar-free" Products. This will help patient to have stable blood glucose profile and potentially avoid unintended weight gain.   - I encouraged the patient to switch to  unprocessed or minimally processed complex starch and increased protein intake (animal or plant source), fruits, and vegetables.   - Patient is advised to stick to a routine mealtimes to eat 3 meals  a day and avoid unnecessary snacks ( to snack only to correct hypoglycemia).  - I have approached him with the following individualized plan to manage  his diabetes and patient agrees:   -Based on his stable glycemic profile overall, he will not need insulin treatment at this time.  -He is continuing to respond favorably to his recent addition of Glipizide, advised to continueGglipizide 5 mg XL p.o. daily at breakfast.  He is advised to continue Metformin 500 mg p.o. twice daily-with breakfast and supper.  -He is encouraged to continue monitoring blood glucose twice daily, before breakfast and before bed, and to call the clinic if he has readings less than 70 or greater than 200 for 3  tests in a row.  - he will be considered for incretin therapy as appropriate next visit.  - Specific targets for  A1c;  LDL, HDL,  and Triglycerides were discussed with the patient.  2) Blood Pressure /Hypertension:  His blood pressure is controlled to target for his age.  He is advised to continue Lisinopril 5 mg po daily.  3) Lipids/Hyperlipidemia:    His most recent lipid panel from 01/02/20 shows controlled LDL at 85 and elevated triglycerides of 236.  He is advised to continue Lipitor 20 mg po daily at bedtime.  Side effects and precautions discussed with him.  4)  Weight/Diet:  His Body mass index is 42.13 kg/m.  -   clearly complicating his diabetes care.   he is  a candidate for weight loss. I discussed with him the fact that loss of 5 - 10% of his  current body weight will have the most impact on his diabetes management.  Exercise, and detailed carbohydrates information provided  -  detailed on discharge instructions.  5) Chronic Care/Health Maintenance: -he is on ACEI medications and Statin medications and is encouraged to initiate and continue to follow up with Ophthalmology, Dentist,  Podiatrist at least yearly or according to recommendations, and advised to   stay away from smoking. I have recommended yearly flu vaccine and pneumonia vaccine at least every 5 years; moderate intensity exercise for up to 150 minutes weekly; and  sleep for at least 7 hours a day.  - he is  advised to maintain close follow up with Sharilyn Sites, MD for primary care needs, as well as his other providers for optimal and coordinated care.     I spent 35 minutes in the care of the patient today including review of labs from [facility], Lipids, Thyroid Function, Hematology (current and previous including abstractions from other facilities); face-to-face time discussing  his blood glucose readings/logs, discussing hypoglycemia and hyperglycemia episodes and symptoms, medications doses, his options of short and  long term treatment based on the latest standards of care / guidelines;  discussion about incorporating lifestyle medicine;  and documenting the encounter.    Please refer to Patient Instructions for Blood Glucose Monitoring and Insulin/Medications Dosing Guide"  in media tab for additional information. Please  also refer to " Patient Self Inventory" in the Media  tab for reviewed elements of pertinent patient history.  Heath Gold participated in the discussions, expressed understanding, and voiced agreement with the above plans.  All questions were answered to his satisfaction. he is encouraged to contact clinic should he have any questions or concerns prior to his return visit.    Follow up plan: - Return in about 4 months (around 11/21/2020) for Diabetes follow up- A1c and urine micro in office, No previsit labs, Bring glucometer and logs.     Rayetta Pigg, [facility] [address] [place], [place] [address] Phone: [phone] Fax: [phone]  07/22/2020, 1:29 PM

## 2020-10-19 ENCOUNTER — Telehealth: Payer: Self-pay | Admitting: Nurse Practitioner

## 2020-10-19 NOTE — Telephone Encounter (Signed)
Pt is calling and states he feels his sugar has been running high and wants to know if it can come from 2% milk with his cereal. Pt is testing 2x a day morning and night.   7/15 146 - 181  7/16 165 - 118  7/17 120 - 131  7/18 169  Pt requesting call back.

## 2020-10-20 NOTE — Telephone Encounter (Signed)
Cereal itself can cause high sugars, its not likely the milk.

## 2020-10-20 NOTE — Telephone Encounter (Signed)
Pt.notified

## 2020-10-20 NOTE — Telephone Encounter (Signed)
Left a message requesting a return call to the office. 

## 2020-11-24 ENCOUNTER — Encounter: Payer: Self-pay | Admitting: Nurse Practitioner

## 2020-11-24 ENCOUNTER — Ambulatory Visit (INDEPENDENT_AMBULATORY_CARE_PROVIDER_SITE_OTHER): Payer: Medicare Other | Admitting: Nurse Practitioner

## 2020-11-24 ENCOUNTER — Other Ambulatory Visit: Payer: Self-pay

## 2020-11-24 VITALS — BP 142/86 | HR 80 | Ht 67.0 in | Wt 274.0 lb

## 2020-11-24 DIAGNOSIS — I10 Essential (primary) hypertension: Secondary | ICD-10-CM

## 2020-11-24 DIAGNOSIS — E11649 Type 2 diabetes mellitus with hypoglycemia without coma: Secondary | ICD-10-CM | POA: Diagnosis not present

## 2020-11-24 DIAGNOSIS — E782 Mixed hyperlipidemia: Secondary | ICD-10-CM

## 2020-11-24 DIAGNOSIS — E559 Vitamin D deficiency, unspecified: Secondary | ICD-10-CM

## 2020-11-24 LAB — POCT GLYCOSYLATED HEMOGLOBIN (HGB A1C): HbA1c, POC (controlled diabetic range): 6.8 % (ref 0.0–7.0)

## 2020-11-24 LAB — POCT UA - MICROALBUMIN
Albumin/Creatinine Ratio, Urine, POC: <30
Creatinine, POC: 200 mg/dL
Microalbumin Ur, POC: 30 mg/L

## 2020-11-24 NOTE — Patient Instructions (Signed)
Advice for Weight Management  -For most of us the best way to lose weight is by diet management. Generally speaking, diet management means consuming less calories intentionally which over time brings about progressive weight loss.  This can be achieved more effectively by restricting carbohydrate consumption to the minimum possible.  So, it is critically important to know your numbers: how much calorie you are consuming and how much calorie you need. More importantly, our carbohydrates sources should be unprocessed or minimally processed complex starch food items.   Sometimes, it is important to balance nutrition by increasing protein intake (animal or plant source), fruits, and vegetables.  -Sticking to a routine mealtime to eat 3 meals a day and avoiding unnecessary snacks is shown to have a big role in weight control. Under normal circumstances, the only time we lose real weight is when we are hungry, so allow hunger to take place- hunger means no food between meal times, only water.  It is not advisable to starve.   -It is better to avoid simple carbohydrates including: Cakes, Sweet Desserts, Ice Cream, Soda (diet and regular), Sweet Tea, Candies, Chips, Cookies, Store Bought Juices, Alcohol in Excess of  1-2 drinks a day, Artificial Sweeteners, Doughnuts, Coffee Creamers, "Sugar-free" Products, etc, etc.  This is not a complete list.....    -Consulting with certified diabetes educators is proven to provide you with the most accurate and current information on diet.  Also, you may be  interested in discussing diet options/exchanges , we can schedule a visit with Joe [name], RDN, CDE for individualized nutrition education.  -Exercise: If you are able: 30 -60 minutes a day ,4 days a week, or 150 minutes a week.  The longer the better.  Combine stretch, strength, and aerobic activities.  If you were told in the past that you have high risk for cardiovascular diseases, you may seek evaluation by  your heart doctor prior to initiating moderate to intense exercise programs.    

## 2020-11-24 NOTE — Progress Notes (Signed)
11/24/2020, 1:31 PM  Endocrinology follow-up note   Subjective:    Patient ID: Joe [name], male    DOB: 05-14-1950.  Joe [name] is being seen in follow-up after he was seen in consultation for management of currently uncontrolled symptomatic diabetes requested by  Sharilyn Sites, MD.   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    Past Surgical History:  Procedure Laterality Date   APPENDECTOMY  66    mmh   CATARACT EXTRACTION W/PHACO  12/09/2010   Procedure: CATARACT EXTRACTION PHACO AND INTRAOCULAR LENS PLACEMENT (IOC);  Surgeon: Tonny Branch;  Location: [facility];  Service: Ophthalmology;  Laterality: Right;  CDE: 36.21   CATARACT EXTRACTION W/PHACO  10/24/2011   Procedure: CATARACT EXTRACTION PHACO AND INTRAOCULAR LENS PLACEMENT (IOC);  Surgeon: Tonny Branch, MD;  Location: [facility];  Service: Ophthalmology;  Laterality: Left;  CDE 10.35   COLONOSCOPY  09/20/2011   Procedure: COLONOSCOPY;  Surgeon: Jamesetta So, MD;  Location: [facility];  Service: Gastroenterology;  Laterality: N/A;   CYSTOSCOPY     with kidney stone removal-mmh   HERNIA REPAIR     umbilical hernia repair-mmh   INCISIONAL HERNIA REPAIR N/A 09/16/2015   Procedure: HERNIA REPAIR INCISIONAL WITH MESH;  Surgeon: Aviva Signs, MD;  Location: [facility];  Service: General;  Laterality: N/A;  Umbilicus   INSERTION OF MESH N/A 09/16/2015   Procedure: INSERTION OF MESH UMBILICUS;  Surgeon: Aviva Signs, MD;  Location: [facility];  Service: General;  Laterality: N/A;    Social  History   Socioeconomic History   Marital status: Divorced    Spouse name: Not on file   Number of children: 2   Years of education: 12   Highest education level: Not on file  Occupational History    Employer: NOT EMPLOYED   Occupation: Disabled   Tobacco Use   Smoking status: Never   Smokeless tobacco: Never  Substance and Sexual Activity   Alcohol use: No    Alcohol/week: 0.0 standard drinks   Drug use: No   Sexual activity: Never    Birth control/protection: None  Other Topics Concern   Not on file  Social History Narrative   Patient lives at [facility].     Patient is disabled.    Patient has a high school education.    Patient is divorced.    Patient has 2 children.    Patient is right handed.   Social Determinants of Health   Financial Resource Strain: Not on file  Food Insecurity: Not on file  Transportation Needs: Not on file  Physical Activity: Not on file  Stress: Not on file  Social Connections: Not on file    Family History  Problem Relation Age of Onset   Heart disease Other    Cancer Other    Hypertension Other    Anesthesia problems Neg Hx    Hypotension Neg Hx    Malignant hyperthermia Neg Hx    Pseudochol deficiency Neg Hx     Outpatient Encounter Medications as of 11/24/2020  Medication Sig   amitriptyline (ELAVIL) 25 MG tablet Take 25 mg by mouth at bedtime.   atorvastatin (LIPITOR) 20 MG tablet TAKE (1) TABLET BY MOUTH ONCE DAILY.   calcium-vitamin D (OSCAL WITH D) 500-200 MG-UNIT tablet Take 1 tablet by mouth 2 (two) times daily.   cholecalciferol (VITAMIN D) 1000 units tablet Take 1,000 Units by mouth daily.   famotidine (PEPCID) 20 MG tablet Take 20 mg by mouth daily.   FLUoxetine (PROZAC) 20 MG capsule Take 20 mg by mouth daily.   glipiZIDE (GLUCOTROL XL) 5 MG 24 hr tablet TAKE (1) TABLET BY MOUTH ONCE DAILY WITH BREAKFAST.   lisinopril (ZESTRIL) 5 MG tablet TAKE (1) TABLET BY MOUTH ONCE DAILY.   metFORMIN (GLUCOPHAGE-XR) 500  MG 24 hr tablet Take 500 mg by mouth 2 (two) times daily.   Multiple Vitamins-Minerals (MULTIVITAMIN WITH MINERALS) tablet Take 1 tablet by mouth daily.   traZODone (DESYREL) 50 MG tablet Take 50 mg by mouth at bedtime as needed.   No facility-administered encounter medications on file as of 11/24/2020.    ALLERGIES: No Known Allergies  VACCINATION STATUS:  There is no immunization history on file for this patient.  Diabetes He presents for his follow-up diabetic visit. He has type 2 diabetes mellitus. Onset time: She was diagnosed at approximate age of 47 years. His disease course has been improving. There are no hypoglycemic associated symptoms. Pertinent negatives for hypoglycemia include no confusion, headaches, pallor or seizures. Associated symptoms include blurred vision. Pertinent negatives for diabetes include no chest pain, no fatigue, no polydipsia, no polyphagia, no polyuria and no weakness. There are no hypoglycemic complications. Symptoms are stable. Diabetic complications include nephropathy. Risk factors for coronary artery disease include diabetes mellitus, family history, male sex, hypertension, obesity, sedentary lifestyle and dyslipidemia. Current diabetic treatment includes oral agent (dual therapy). He is compliant with treatment most of the time. His weight is increasing steadily. He is following a generally healthy diet. When asked about meal planning, he reported none. He participates in exercise intermittently. His home blood glucose trend is fluctuating minimally. His overall blood glucose range is 140-180 mg/dl. (He presents today with his meter and logs showing stable, at goal, fasting and postprandial glycemic profile.  His POCT A1c today is 6.8%, improving from last visit of 7.1%.  He denies any hypoglycemia.) An ACE inhibitor/angiotensin II receptor blocker is being taken. He does not see a podiatrist.Eye exam is current.  Hypertension This is a chronic problem. The  current episode started more than 1 year ago. The problem has been gradually improving since onset. The problem is controlled. Associated symptoms include blurred vision. Pertinent negatives include no chest pain, headaches, neck pain, palpitations or shortness of breath. There are no associated agents to hypertension. Risk factors for coronary artery disease include diabetes mellitus, male gender, obesity, sedentary lifestyle, family history and dyslipidemia. Past treatments include ACE inhibitors. The current treatment provides moderate improvement. There are no compliance problems.  Hypertensive end-organ damage includes kidney disease. Identifiable causes of hypertension include chronic renal disease and sleep apnea.    Review of systems  Constitutional: + Minimally fluctuating body weight,  current Body mass  index is 42.91 kg/m. , no fatigue, no subjective hyperthermia, no subjective hypothermia Eyes: no blurry vision, no xerophthalmia ENT: no sore throat, no nodules palpated in throat, no dysphagia/odynophagia, no hoarseness Cardiovascular: no chest pain, no shortness of breath, no palpitations, no leg swelling Respiratory: no cough, no shortness of breath Gastrointestinal: no nausea/vomiting/diarrhea Musculoskeletal: no muscle/joint aches Skin: no rashes, no hyperemia Neurological: no tremors, no numbness, no tingling, no dizziness Psychiatric: no depression, no anxiety   Objective:     BP (!) 142/86   Pulse 80   Ht '5\' 7"'$  (1.702 m)   Wt 274 lb (124.3 kg)   BMI 42.91 kg/m   Wt Readings from Last 3 Encounters:  11/24/20 274 lb (124.3 kg)  07/22/20 269 lb (122 kg)  03/05/20 261 lb (118.4 kg)    BP Readings from Last 3 Encounters:  11/24/20 (!) 142/86  07/22/20 (!) 146/80  03/05/20 129/68     Physical Exam- Limited  Constitutional:  Body mass index is 42.91 kg/m. , not in acute distress, normal state of mind Eyes:  EOMI, no exophthalmos Neck: Supple Cardiovascular:  RRR, no murmurs, rubs, or gallops, no edema Respiratory: Adequate breathing efforts, no crackles, rales, rhonchi, or wheezing Musculoskeletal: no gross deformities, strength intact in all four extremities, no gross restriction of joint movements Skin:  no rashes, no hyperemia Neurological: no tremor with outstretched hands    CMP     Component Value Date/Time   NA 135 07/03/2020 0858   K 4.8 07/03/2020 0858   CL 99 07/03/2020 0858   CO2 20 07/03/2020 0858   GLUCOSE 144 (H) 07/03/2020 0858   GLUCOSE 74 02/06/2016 1850   BUN 17 07/03/2020 0858   CREATININE 0.99 07/03/2020 0858   CALCIUM 9.6 07/03/2020 0858   PROT 7.1 07/03/2020 0858   ALBUMIN 4.8 07/03/2020 0858   AST 18 07/03/2020 0858   ALT 23 07/03/2020 0858   ALKPHOS 53 07/03/2020 0858   BILITOT 0.5 07/03/2020 0858   GFRNONAA 76 01/02/2020 0000   GFRAA 88 01/02/2020 0000     Diabetic Labs (most recent): Lab Results  Component Value Date   HGBA1C 6.8 11/24/2020   HGBA1C 7.1 (A) 07/22/2020   HGBA1C 7.1 (A) 03/05/2020     Lipid Panel ( most recent) Lipid Panel     Component Value Date/Time   CHOL 165 01/02/2020 0000   TRIG 236 (A) 01/02/2020 0000   HDL 41 01/02/2020 0000   LDLCALC 85 01/02/2020 0000      Assessment & Plan:   1) Uncontrolled type 2 diabetes mellitus with hypoglycemia without coma (HCC)  - Buff Stilwell has currently uncontrolled symptomatic type 2 DM since 70 years of age.  He presents today with his meter and logs showing stable, at goal, fasting and postprandial glycemic profile.  His POCT A1c today is 6.8%, improving from last visit of 7.1%.  He denies any hypoglycemia.  - Recent labs reviewed.  - I had a long discussion with him about the progressive nature of diabetes and the pathology behind its complications. -his diabetes is complicated by obesity/sedentary life and he remains at a high risk for more acute and chronic complications which include CAD, CVA, CKD, retinopathy, and  neuropathy. These are all discussed in detail with him.  - Nutritional counseling repeated at each appointment due to patients tendency to fall back in to old habits.  - The patient admits there is a room for improvement in their diet and drink choices. -  Suggestion  is made for the patient to avoid simple carbohydrates from their diet including Cakes, Sweet Desserts / Pastries, Ice Cream, Soda (diet and regular), Sweet Tea, Candies, Chips, Cookies, Sweet Pastries, Store Bought Juices, Alcohol in Excess of 1-2 drinks a day, Artificial Sweeteners, Coffee Creamer, and "Sugar-free" Products. This will help patient to have stable blood glucose profile and potentially avoid unintended weight gain.   - I encouraged the patient to switch to unprocessed or minimally processed complex starch and increased protein intake (animal or plant source), fruits, and vegetables.   - Patient is advised to stick to a routine mealtimes to eat 3 meals a day and avoid unnecessary snacks (to snack only to correct hypoglycemia).  - I have approached him with the following individualized plan to manage  his diabetes and patient agrees:   -Based on his stable glycemic profile overall, he will not need insulin treatment at this time. He is advised to continue Metformin 500 mg po twice daily with meals and Glipizide 5 mg XL daily with breakfast.   -He is encouraged to continue monitoring blood glucose twice daily, before breakfast and before bed, and to call the clinic if he has readings less than 70 or greater than 200 for 3 tests in a row.  - he will be considered for incretin therapy as appropriate next visit.  - Specific targets for A1c;  LDL, HDL,  and Triglycerides were discussed with the patient.  2) Blood Pressure /Hypertension:  His blood pressure is controlled to target for his age.  He is advised to continue Lisinopril 5 mg po daily.  3) Lipids/Hyperlipidemia:    His most recent lipid panel from 01/02/20 shows  controlled LDL at 85 and elevated triglycerides of 236.  He is advised to continue Lipitor 20 mg po daily at bedtime.  Side effects and precautions discussed with him.  Will recheck lipid panel prior to next visit.  4)  Weight/Diet:  His Body mass index is 42.91 kg/m.  -   clearly complicating his diabetes care.  he is a candidate for weight loss. I discussed with him the fact that loss of 5 - 10% of his  current body weight will have the most impact on his diabetes management.  Exercise, and detailed carbohydrates information provided  -  detailed on discharge instructions.  5) Chronic Care/Health Maintenance: -he is on ACEI medications and Statin medications and is encouraged to initiate and continue to follow up with Ophthalmology, Dentist,  Podiatrist at least yearly or according to recommendations, and advised to stay away from smoking. I have recommended yearly flu vaccine and pneumonia vaccine at least every 5 years; moderate intensity exercise for up to 150 minutes weekly; and  sleep for at least 7 hours a day.  - he is advised to maintain close follow up with Sharilyn Sites, MD for primary care needs, as well as his other providers for optimal and coordinated care.      I spent 30 minutes in the care of the patient today including review of labs from [place], Lipids, Thyroid Function, Hematology (current and previous including abstractions from other facilities); face-to-face time discussing  his blood glucose readings/logs, discussing hypoglycemia and hyperglycemia episodes and symptoms, medications doses, his options of short and long term treatment based on the latest standards of care / guidelines;  discussion about incorporating lifestyle medicine;  and documenting the encounter.    Please refer to Patient Instructions for Blood Glucose Monitoring and Insulin/Medications Dosing Guide"  in media  tab for additional information. Please  also refer to " Patient Self Inventory" in the Media  tab  for reviewed elements of pertinent patient history.  Heath Gold participated in the discussions, expressed understanding, and voiced agreement with the above plans.  All questions were answered to his satisfaction. he is encouraged to contact clinic should he have any questions or concerns prior to his return visit.    Follow up plan: - Return in about 4 months (around 03/26/2021) for Diabetes F/U with A1c in office, Previsit labs, Bring meter and logs.     Rayetta Pigg, [facility] [address] [place], [place] [address] Phone: [phone] Fax: [phone]  11/24/2020, 1:31 PM

## 2020-12-04 ENCOUNTER — Other Ambulatory Visit: Payer: Self-pay | Admitting: Nurse Practitioner

## 2021-01-04 ENCOUNTER — Other Ambulatory Visit: Payer: Self-pay | Admitting: Nurse Practitioner

## 2021-03-29 ENCOUNTER — Other Ambulatory Visit: Payer: Self-pay | Admitting: Nurse Practitioner

## 2021-03-30 ENCOUNTER — Ambulatory Visit: Payer: Medicare Other | Admitting: Nurse Practitioner

## 2021-04-01 ENCOUNTER — Ambulatory Visit: Payer: Medicare Other | Admitting: Nurse Practitioner

## 2021-04-01 NOTE — Patient Instructions (Incomplete)

## 2021-04-02 LAB — COMPREHENSIVE METABOLIC PANEL
ALT: 24 IU/L (ref 0–44)
AST: 21 IU/L (ref 0–40)
Albumin/Globulin Ratio: 2.2 (ref 1.2–2.2)
Albumin: 4.7 g/dL (ref 3.7–4.7)
Alkaline Phosphatase: 56 IU/L (ref 44–121)
BUN/Creatinine Ratio: 19 (ref 10–24)
BUN: 18 mg/dL (ref 8–27)
Bilirubin Total: 0.5 mg/dL (ref 0.0–1.2)
CO2: 26 mmol/L (ref 20–29)
Calcium: 9.2 mg/dL (ref 8.6–10.2)
Chloride: 100 mmol/L (ref 96–106)
Creatinine, Ser: 0.97 mg/dL (ref 0.76–1.27)
Globulin, Total: 2.1 g/dL (ref 1.5–4.5)
Glucose: 119 mg/dL — ABNORMAL HIGH (ref 70–99)
Potassium: 4.8 mmol/L (ref 3.5–5.2)
Sodium: 139 mmol/L (ref 134–144)
Total Protein: 6.8 g/dL (ref 6.0–8.5)
eGFR: 83 mL/min/{1.73_m2} (ref >59)

## 2021-04-02 LAB — LIPID PANEL
Chol/HDL Ratio: 3 ratio (ref 0.0–5.0)
Cholesterol, Total: 130 mg/dL (ref 100–199)
HDL: 43 mg/dL (ref >39)
LDL Chol Calc (NIH): 61 mg/dL (ref 0–99)
Triglycerides: 153 mg/dL — ABNORMAL HIGH (ref 0–149)
VLDL Cholesterol Cal: 26 mg/dL (ref 5–40)

## 2021-04-02 LAB — T4, FREE: Free T4: 1.06 ng/dL (ref 0.82–1.77)

## 2021-04-02 LAB — TSH: TSH: 1.99 u[IU]/mL (ref 0.450–4.500)

## 2021-04-09 ENCOUNTER — Encounter: Payer: Self-pay | Admitting: Nurse Practitioner

## 2021-04-09 ENCOUNTER — Other Ambulatory Visit: Payer: Self-pay

## 2021-04-09 ENCOUNTER — Ambulatory Visit (INDEPENDENT_AMBULATORY_CARE_PROVIDER_SITE_OTHER): Payer: Medicare Other | Admitting: Nurse Practitioner

## 2021-04-09 VITALS — BP 141/73 | HR 86 | Ht 67.0 in | Wt 276.6 lb

## 2021-04-09 DIAGNOSIS — E559 Vitamin D deficiency, unspecified: Secondary | ICD-10-CM

## 2021-04-09 DIAGNOSIS — E11649 Type 2 diabetes mellitus with hypoglycemia without coma: Secondary | ICD-10-CM

## 2021-04-09 DIAGNOSIS — I10 Essential (primary) hypertension: Secondary | ICD-10-CM | POA: Diagnosis not present

## 2021-04-09 DIAGNOSIS — E782 Mixed hyperlipidemia: Secondary | ICD-10-CM

## 2021-04-09 LAB — POCT GLYCOSYLATED HEMOGLOBIN (HGB A1C): HbA1c, POC (controlled diabetic range): 7 % (ref 0.0–7.0)

## 2021-04-09 NOTE — Patient Instructions (Signed)

## 2021-04-09 NOTE — Progress Notes (Signed)
04/09/2021, 10:41 AM  Endocrinology follow-up note   Subjective:    Patient ID: Joe [name], male    DOB: December 30, 1950.  Joe [name] is being seen in follow-up after he was seen in consultation for management of currently uncontrolled symptomatic diabetes requested by  Sharilyn Sites, MD.   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    Past Surgical History:  Procedure Laterality Date   APPENDECTOMY  35    mmh   CATARACT EXTRACTION W/PHACO  12/09/2010   Procedure: CATARACT EXTRACTION PHACO AND INTRAOCULAR LENS PLACEMENT (IOC);  Surgeon: Tonny Branch;  Location: [facility];  Service: Ophthalmology;  Laterality: Right;  CDE: 36.21   CATARACT EXTRACTION W/PHACO  10/24/2011   Procedure: CATARACT EXTRACTION PHACO AND INTRAOCULAR LENS PLACEMENT (IOC);  Surgeon: Tonny Branch, MD;  Location: [facility];  Service: Ophthalmology;  Laterality: Left;  CDE 10.35   COLONOSCOPY  09/20/2011   Procedure: COLONOSCOPY;  Surgeon: Jamesetta So, MD;  Location: [facility];  Service: Gastroenterology;  Laterality: N/A;   CYSTOSCOPY     with kidney stone removal-mmh   HERNIA REPAIR     umbilical hernia repair-mmh   INCISIONAL HERNIA REPAIR N/A 09/16/2015   Procedure: HERNIA REPAIR INCISIONAL WITH MESH;  Surgeon: Aviva Signs, MD;  Location: [facility];  Service: General;  Laterality: N/A;  Umbilicus   INSERTION OF MESH N/A 09/16/2015   Procedure: INSERTION OF MESH UMBILICUS;  Surgeon: Aviva Signs, MD;  Location: [facility];  Service: General;  Laterality: N/A;    Social  History   Socioeconomic History   Marital status: Divorced    Spouse name: Not on file   Number of children: 2   Years of education: 12   Highest education level: Not on file  Occupational History    Employer: NOT EMPLOYED   Occupation: Disabled   Tobacco Use   Smoking status: Never   Smokeless tobacco: Never  Vaping Use   Vaping Use: Never used  Substance and Sexual Activity   Alcohol use: No    Alcohol/week: 0.0 standard drinks   Drug use: No   Sexual activity: Never    Birth control/protection: None  Other Topics Concern   Not on file  Social History Narrative   Patient lives at [facility].     Patient is disabled.    Patient has a high school education.    Patient is divorced.    Patient has 2 children.    Patient is right handed.   Social Determinants of Health   Financial Resource Strain: Not on file  Food Insecurity: Not on file  Transportation Needs: Not on file  Physical Activity: Not on file  Stress: Not on file  Social  Connections: Not on file    Family History  Problem Relation Age of Onset   Heart disease Other    Cancer Other    Hypertension Other    Anesthesia problems Neg Hx    Hypotension Neg Hx    Malignant hyperthermia Neg Hx    Pseudochol deficiency Neg Hx     Outpatient Encounter Medications as of 04/09/2021  Medication Sig   amitriptyline (ELAVIL) 25 MG tablet Take 25 mg by mouth at bedtime.   atorvastatin (LIPITOR) 20 MG tablet TAKE (1) TABLET BY MOUTH ONCE DAILY.   calcium-vitamin D (OSCAL WITH D) 500-200 MG-UNIT tablet Take 1 tablet by mouth 2 (two) times daily.   cholecalciferol (VITAMIN D) 1000 units tablet Take 1,000 Units by mouth daily.   famotidine (PEPCID) 20 MG tablet Take 20 mg by mouth daily.   FLUoxetine (PROZAC) 20 MG capsule Take 20 mg by mouth daily.   glipiZIDE (GLUCOTROL XL) 5 MG 24 hr tablet TAKE (1) TABLET BY MOUTH ONCE DAILY WITH BREAKFAST.   lisinopril (ZESTRIL) 5 MG tablet TAKE (1) TABLET BY MOUTH ONCE  DAILY.   metFORMIN (GLUCOPHAGE-XR) 500 MG 24 hr tablet Take 500 mg by mouth 2 (two) times daily.   Multiple Vitamins-Minerals (MULTIVITAMIN WITH MINERALS) tablet Take 1 tablet by mouth daily.   traZODone (DESYREL) 50 MG tablet Take 50 mg by mouth at bedtime as needed. (Patient not taking: Reported on 04/09/2021)   No facility-administered encounter medications on file as of 04/09/2021.    ALLERGIES: No Known Allergies  VACCINATION STATUS:  There is no immunization history on file for this patient.  Diabetes He presents for his follow-up diabetic visit. He has type 2 diabetes mellitus. Onset time: She was diagnosed at approximate age of 28 years. His disease course has been stable. There are no hypoglycemic associated symptoms. Pertinent negatives for hypoglycemia include no confusion, headaches, pallor or seizures. Associated symptoms include blurred vision. Pertinent negatives for diabetes include no chest pain, no fatigue, no polydipsia, no polyphagia, no polyuria and no weakness. There are no hypoglycemic complications. Symptoms are stable. Diabetic complications include nephropathy. Risk factors for coronary artery disease include diabetes mellitus, family history, male sex, hypertension, obesity, sedentary lifestyle and dyslipidemia. Current diabetic treatment includes oral agent (dual therapy). He is compliant with treatment most of the time. His weight is fluctuating minimally. He is following a generally healthy diet. When asked about meal planning, he reported none. He participates in exercise intermittently. His home blood glucose trend is fluctuating minimally. His overall blood glucose range is 140-180 mg/dl. (He presents today with his logs, no meter, showing stable glycemic profile with at target fasting and postprandial readings.  His POCT A1c today is 7%, essentially unchanged from previous visit of 6.8%.  He was surprised by this as he admits he ate things he knows he shouldn't.  He  denies any hypoglycemia.) An ACE inhibitor/angiotensin II receptor blocker is being taken. He does not see a podiatrist.Eye exam is current.  Hypertension This is a chronic problem. The current episode started more than 1 year ago. The problem has been gradually improving since onset. The problem is controlled. Associated symptoms include blurred vision. Pertinent negatives include no chest pain, headaches, neck pain, palpitations or shortness of breath. There are no associated agents to hypertension. Risk factors for coronary artery disease include diabetes mellitus, male gender, obesity, sedentary lifestyle, family history and dyslipidemia. Past treatments include ACE inhibitors. The current treatment provides moderate improvement. There are no compliance problems.  Hypertensive end-organ damage includes kidney disease. Identifiable causes of hypertension include chronic renal disease and sleep apnea.    Review of systems  Constitutional: + Minimally fluctuating body weight,  current Body mass index is 43.32 kg/m. , no fatigue, no subjective hyperthermia, no subjective hypothermia Eyes: no blurry vision, no xerophthalmia ENT: no sore throat, no nodules palpated in throat, no dysphagia/odynophagia, no hoarseness Cardiovascular: no chest pain, no shortness of breath, no palpitations, no leg swelling Respiratory: no cough, no shortness of breath Gastrointestinal: no nausea/vomiting/diarrhea Musculoskeletal: no muscle/joint aches Skin: no rashes, no hyperemia Neurological: no tremors, no numbness, no tingling, no dizziness Psychiatric: no depression, no anxiety   Objective:     BP (!) 141/73    Pulse 86    Ht 5\' 7"  (1.702 m)    Wt 276 lb 9.6 oz (125.5 kg)    SpO2 96%    BMI 43.32 kg/m   Wt Readings from Last 3 Encounters:  04/09/21 276 lb 9.6 oz (125.5 kg)  11/24/20 274 lb (124.3 kg)  07/22/20 269 lb (122 kg)    BP Readings from Last 3 Encounters:  04/09/21 (!) 141/73  11/24/20 (!)  142/86  07/22/20 (!) 146/80     Physical Exam- Limited  Constitutional:  Body mass index is 43.32 kg/m. , not in acute distress, slightly anxious state of mind Eyes:  EOMI, no exophthalmos Neck: Supple Cardiovascular: RRR, no murmurs, rubs, or gallops, no edema Respiratory: Adequate breathing efforts, no crackles, rales, rhonchi, or wheezing Musculoskeletal: no gross deformities, strength intact in all four extremities, no gross restriction of joint movements Skin:  no rashes, no hyperemia Neurological: no tremor with outstretched hands    CMP     Component Value Date/Time   NA 139 04/01/2021 1409   K 4.8 04/01/2021 1409   CL 100 04/01/2021 1409   CO2 26 04/01/2021 1409   GLUCOSE 119 (H) 04/01/2021 1409   GLUCOSE 74 02/06/2016 1850   BUN 18 04/01/2021 1409   CREATININE 0.97 04/01/2021 1409   CALCIUM 9.2 04/01/2021 1409   PROT 6.8 04/01/2021 1409   ALBUMIN 4.7 04/01/2021 1409   AST 21 04/01/2021 1409   ALT 24 04/01/2021 1409   ALKPHOS 56 04/01/2021 1409   BILITOT 0.5 04/01/2021 1409   GFRNONAA 76 01/02/2020 0000   GFRAA 88 01/02/2020 0000     Diabetic Labs (most recent): Lab Results  Component Value Date   HGBA1C 7.0 04/09/2021   HGBA1C 6.8 11/24/2020   HGBA1C 7.1 (A) 07/22/2020     Lipid Panel ( most recent) Lipid Panel     Component Value Date/Time   CHOL 130 04/01/2021 1409   TRIG 153 (H) 04/01/2021 1409   HDL 43 04/01/2021 1409   CHOLHDL 3.0 04/01/2021 1409   LDLCALC 61 04/01/2021 1409   LABVLDL 26 04/01/2021 1409      Assessment & Plan:   1) Uncontrolled type 2 diabetes mellitus with hypoglycemia without coma (HCC)  - Joe [name] has currently uncontrolled symptomatic type 2 DM since 71 years of age.  He presents today with his logs, no meter, showing stable glycemic profile with at target fasting and postprandial readings.  His POCT A1c today is 7%, essentially unchanged from previous visit of 6.8%.  He was surprised by this as he admits he ate  things he knows he shouldn't.  He denies any hypoglycemia.  - Recent labs reviewed.  - I had a long discussion with him about the progressive nature of diabetes and the  pathology behind its complications. -his diabetes is complicated by obesity/sedentary life and he remains at a high risk for more acute and chronic complications which include CAD, CVA, CKD, retinopathy, and neuropathy. These are all discussed in detail with him.  - Nutritional counseling repeated at each appointment due to patients tendency to fall back in to old habits.  - The patient admits there is a room for improvement in their diet and drink choices. -  Suggestion is made for the patient to avoid simple carbohydrates from their diet including Cakes, Sweet Desserts / Pastries, Ice Cream, Soda (diet and regular), Sweet Tea, Candies, Chips, Cookies, Sweet Pastries, Store Bought Juices, Alcohol in Excess of 1-2 drinks a day, Artificial Sweeteners, Coffee Creamer, and "Sugar-free" Products. This will help patient to have stable blood glucose profile and potentially avoid unintended weight gain.   - I encouraged the patient to switch to unprocessed or minimally processed complex starch and increased protein intake (animal or plant source), fruits, and vegetables.   - Patient is advised to stick to a routine mealtimes to eat 3 meals a day and avoid unnecessary snacks (to snack only to correct hypoglycemia).  - I have approached him with the following individualized plan to manage  his diabetes and patient agrees:   -Based on his stable glycemic profile overall, he will not need insulin treatment at this time. He is advised to continue Metformin 500 mg po twice daily with meals and Glipizide 5 mg XL daily with breakfast.   -He is encouraged to continue monitoring blood glucose twice daily, before breakfast and before bed, and to call the clinic if he has readings less than 70 or greater than 200 for 3 tests in a row.  - he will  be considered for incretin therapy as appropriate next visit.  - Specific targets for A1c;  LDL, HDL,  and Triglycerides were discussed with the patient.  2) Blood Pressure /Hypertension:  His blood pressure is controlled to target for his age.  He is advised to continue Lisinopril 5 mg po daily.  3) Lipids/Hyperlipidemia:    His most recent lipid panel from 04/01/21 shows controlled LDL at 61 and elevated triglycerides of 153- greatly improved.  He is advised to continue Lipitor 20 mg po daily at bedtime.  Side effects and precautions discussed with him.    4)  Weight/Diet:  His Body mass index is 43.32 kg/m.  -   clearly complicating his diabetes care.  he is a candidate for weight loss. I discussed with him the fact that loss of 5 - 10% of his  current body weight will have the most impact on his diabetes management.  Exercise, and detailed carbohydrates information provided  -  detailed on discharge instructions.  5) Chronic Care/Health Maintenance: -he is on ACEI medications and Statin medications and is encouraged to initiate and continue to follow up with Ophthalmology, Dentist,  Podiatrist at least yearly or according to recommendations, and advised to stay away from smoking. I have recommended yearly flu vaccine and pneumonia vaccine at least every 5 years; moderate intensity exercise for up to 150 minutes weekly; and  sleep for at least 7 hours a day.  - he is advised to maintain close follow up with Sharilyn Sites, MD for primary care needs, as well as his other providers for optimal and coordinated care.      I spent 40 minutes in the care of the patient today including review of labs from CMP, Lipids, Thyroid  Function, Hematology (current and previous including abstractions from other facilities); face-to-face time discussing  his blood glucose readings/logs, discussing hypoglycemia and hyperglycemia episodes and symptoms, medications doses, his options of short and long term  treatment based on the latest standards of care / guidelines;  discussion about incorporating lifestyle medicine;  and documenting the encounter.    Please refer to Patient Instructions for Blood Glucose Monitoring and Insulin/Medications Dosing Guide"  in media tab for additional information. Please  also refer to " Patient Self Inventory" in the Media  tab for reviewed elements of pertinent patient history.  Heath Gold participated in the discussions, expressed understanding, and voiced agreement with the above plans.  All questions were answered to his satisfaction. he is encouraged to contact clinic should he have any questions or concerns prior to his return visit.    Follow up plan: - Return in about 4 months (around 08/07/2021) for Diabetes F/U with A1c in office, No previsit labs, Bring meter and logs.     Rayetta Pigg, [facility] [address] [place], [place] [address] Phone: [phone] Fax: [phone]  04/09/2021, 10:41 AM

## 2021-04-23 ENCOUNTER — Other Ambulatory Visit: Payer: Self-pay | Admitting: Nurse Practitioner

## 2021-08-10 ENCOUNTER — Encounter: Payer: Self-pay | Admitting: Nurse Practitioner

## 2021-08-10 ENCOUNTER — Ambulatory Visit (INDEPENDENT_AMBULATORY_CARE_PROVIDER_SITE_OTHER): Payer: Medicare Other | Admitting: Nurse Practitioner

## 2021-08-10 VITALS — BP 144/71 | HR 81 | Ht 67.0 in | Wt 283.0 lb

## 2021-08-10 DIAGNOSIS — E782 Mixed hyperlipidemia: Secondary | ICD-10-CM

## 2021-08-10 DIAGNOSIS — E11649 Type 2 diabetes mellitus with hypoglycemia without coma: Secondary | ICD-10-CM

## 2021-08-10 DIAGNOSIS — E559 Vitamin D deficiency, unspecified: Secondary | ICD-10-CM

## 2021-08-10 DIAGNOSIS — I10 Essential (primary) hypertension: Secondary | ICD-10-CM

## 2021-08-10 LAB — POCT GLYCOSYLATED HEMOGLOBIN (HGB A1C): HbA1c POC (<> result, manual entry): 8 % (ref 4.0–5.6)

## 2021-08-10 NOTE — Progress Notes (Signed)
? ?                                        ?     08/10/2021, 10:19 AM ? ?Endocrinology follow-up note ? ? ?Subjective:  ? ? Patient ID: Joe [name], male    DOB: Sep 21, 1950.  ?Joe [name] is being seen in follow-up after he was seen in consultation for management of currently uncontrolled symptomatic diabetes requested by  Sharilyn Sites, MD. ? ? ?Past Medical History:  ?Diagnosis Date  ? Anxiety   ? Arthritis   ? osteoarthritis  ? Borderline diabetes   ? Chronic pain   ? Depression   ? DJD (degenerative joint disease)   ? GERD (gastroesophageal reflux disease)   ? H/O renal calculi 1992  ? Hypertension   ? 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 Muscle weakness   ? Obesity   ? Psychosis ([place])   ? Sleep apnea   ? STOP BANG; 7;Has study and was positive but have not gotten CPAP yet.  ? ? ?Past Surgical History:  ?Procedure Laterality Date  ? APPENDECTOMY  82  ?  mmh  ? CATARACT EXTRACTION W/PHACO  12/09/2010  ? Procedure: CATARACT EXTRACTION PHACO AND INTRAOCULAR LENS PLACEMENT (IOC);  Surgeon: Tonny Branch;  Location: [facility];  Service: Ophthalmology;  Laterality: Right;  CDE: 36.21  ? CATARACT EXTRACTION W/PHACO  10/24/2011  ? Procedure: CATARACT EXTRACTION PHACO AND INTRAOCULAR LENS PLACEMENT (IOC);  Surgeon: Tonny Branch, MD;  Location: [facility];  Service: Ophthalmology;  Laterality: Left;  CDE 10.35  ? COLONOSCOPY  09/20/2011  ? Procedure: COLONOSCOPY;  Surgeon: Jamesetta So, MD;  [facility];  Service: Gastroenterology;  Laterality: N/A;  ? CYSTOSCOPY    ? with kidney stone removal-mmh  ? HERNIA REPAIR    ? umbilical hernia repair-mmh  ? INCISIONAL HERNIA REPAIR N/A 09/16/2015  ? Procedure: HERNIA REPAIR INCISIONAL WITH MESH;  Surgeon: Aviva Signs, MD;  Location: [facility];  Service: General;  Laterality: N/A;  Umbilicus  ? INSERTION OF MESH N/A 09/16/2015  ? Procedure: INSERTION OF MESH UMBILICUS;  Surgeon: Aviva Signs, MD;  Location: [facility];  Service: General;  Laterality: N/A;  ? ? ?Social  History  ? ?Socioeconomic History  ? Marital status: Divorced  ?  Spouse name: Not on file  ? Number of children: 2  ? Years of education: 53  ? Highest education level: Not on file  ?Occupational History  ?  Employer: NOT EMPLOYED  ? Occupation: Disabled   ?Tobacco Use  ? Smoking status: Never  ? Smokeless tobacco: Never  ?Vaping Use  ? Vaping Use: Never used  ?Substance and Sexual Activity  ? Alcohol use: No  ?  Alcohol/week: 0.0 standard drinks  ? Drug use: No  ? Sexual activity: Never  ?  Birth control/protection: None  ?Other Topics Concern  ? Not on file  ?Social History Narrative  ? Patient lives at [facility].    ? Patient is disabled.   ? Patient has a high school education.   ? Patient is divorced.   ? Patient has 2 children.   ? Patient is right handed.  ? ?Social Determinants of Health  ? ?Financial Resource Strain: Not on file  ?Food Insecurity: Not on file  ?Transportation Needs: Not on file  ?Physical Activity: Not on file  ?Stress: Not on file  ?Social  Connections: Not on file  ? ? ?Family History  ?Problem Relation Age of Onset  ? Heart disease Other   ? Cancer Other   ? Hypertension Other   ? Anesthesia problems Neg Hx   ? Hypotension Neg Hx   ? Malignant hyperthermia Neg Hx   ? Pseudochol deficiency Neg Hx   ? ? ?Outpatient Encounter Medications as of 08/10/2021  ?Medication Sig  ? amitriptyline (ELAVIL) 25 MG tablet Take 25 mg by mouth at bedtime.  ? atorvastatin (LIPITOR) 20 MG tablet TAKE (1) TABLET BY MOUTH ONCE DAILY.  ? calcium-vitamin D (OSCAL WITH D) 500-200 MG-UNIT tablet Take 1 tablet by mouth 2 (two) times daily.  ? cholecalciferol (VITAMIN D) 1000 units tablet Take 1,000 Units by mouth daily.  ? famotidine (PEPCID) 20 MG tablet Take 20 mg by mouth daily.  ? FLUoxetine (PROZAC) 20 MG capsule Take 20 mg by mouth daily.  ? glipiZIDE (GLUCOTROL XL) 5 MG 24 hr tablet TAKE (1) TABLET BY MOUTH ONCE DAILY WITH BREAKFAST.  ? lisinopril (ZESTRIL) 5 MG tablet TAKE (1) TABLET BY MOUTH ONCE  DAILY.  ? metFORMIN (GLUCOPHAGE-XR) 500 MG 24 hr tablet Take 500 mg by mouth 2 (two) times daily.  ? Multiple Vitamins-Minerals (MULTIVITAMIN WITH MINERALS) tablet Take 1 tablet by mouth daily.  ? [DISCONTINUED] traZODone (DESYREL) 50 MG tablet Take 50 mg by mouth at bedtime as needed. (Patient not taking: Reported on 04/09/2021)  ? ?No facility-administered encounter medications on file as of 08/10/2021.  ? ? ?ALLERGIES: ?No Known Allergies ? ?VACCINATION STATUS: ? ?There is no immunization history on file for this patient. ? ?Diabetes ?He presents for his follow-up diabetic visit. He has type 2 diabetes mellitus. Onset time: She was diagnosed at approximate age of 74 years. His disease course has been worsening. There are no hypoglycemic associated symptoms. Pertinent negatives for hypoglycemia include no confusion, headaches, pallor or seizures. Associated symptoms include blurred vision. Pertinent negatives for diabetes include no chest pain, no fatigue, no polydipsia, no polyphagia, no polyuria, no weakness and no weight loss. There are no hypoglycemic complications. Symptoms are stable. Diabetic complications include nephropathy. Risk factors for coronary artery disease include diabetes mellitus, family history, male sex, hypertension, obesity, sedentary lifestyle and dyslipidemia. Current diabetic treatment includes oral agent (dual therapy). He is compliant with treatment most of the time. His weight is increasing steadily. He is following a generally unhealthy diet. When asked about meal planning, he reported none. He participates in exercise intermittently. His home blood glucose trend is fluctuating minimally. His overall blood glucose range is 180-200 mg/dl. (He presents today with his meter and logs showing above target glycemic profile overall.  His POCT A1c today is 8%, increasing from last visit of 7%.  He knows he has been slipping as far as his diet goes.  He continues to ride his stationary bike  several times per week.  He denies any hypoglycemia.) An ACE inhibitor/angiotensin II receptor blocker is being taken. He does not see a podiatrist.Eye exam is current.  ?Hypertension ?This is a chronic problem. The current episode started more than 1 year ago. The problem has been gradually improving since onset. The problem is controlled. Associated symptoms include blurred vision. Pertinent negatives include no chest pain, headaches, neck pain, palpitations or shortness of breath. There are no associated agents to hypertension. Risk factors for coronary artery disease include diabetes mellitus, male gender, obesity, sedentary lifestyle, family history and dyslipidemia. Past treatments include ACE inhibitors. The current treatment provides  moderate improvement. There are no compliance problems.  Hypertensive end-organ damage includes kidney disease. Identifiable causes of hypertension include chronic renal disease and sleep apnea.  ? ? ?Review of systems ? ?Constitutional: + steadily increasing body weight,  current Body mass index is 44.32 kg/m?. , no fatigue, no subjective hyperthermia, no subjective hypothermia ?Eyes: no blurry vision, no xerophthalmia ?ENT: no sore throat, no nodules palpated in throat, no dysphagia/odynophagia, no hoarseness ?Cardiovascular: no chest pain, no shortness of breath, no palpitations, no leg swelling ?Respiratory: no cough, no shortness of breath ?Gastrointestinal: no nausea/vomiting/diarrhea ?Musculoskeletal: no muscle/joint aches ?Skin: no rashes, no hyperemia ?Neurological: no tremors, no numbness, no tingling, no dizziness ?Psychiatric: no depression, no anxiety ? ? ?Objective:  ?  ? ?BP (!) 144/71   Pulse 81   Ht '5\' 7"'$  (1.702 m)   Wt 283 lb (128.4 kg)   BMI 44.32 kg/m?   ?Wt Readings from Last 3 Encounters:  ?08/10/21 283 lb (128.4 kg)  ?04/09/21 276 lb 9.6 oz (125.5 kg)  ?11/24/20 274 lb (124.3 kg)  ?  ?BP Readings from Last 3 Encounters:  ?08/10/21 (!) 144/71  ?04/09/21  (!) 141/73  ?11/24/20 (!) 142/86  ? ? ? ? ?Physical Exam- Limited ? ?Constitutional:  Body mass index is 44.32 kg/m?. , not in acute distress, normal state of mind ?Eyes:  EOMI, no exophthalmos ?Neck: Supple ?Cardio

## 2021-08-10 NOTE — Patient Instructions (Signed)
Diabetes Mellitus and Foot Care Foot care is an important part of your health, especially when you have diabetes. Diabetes may cause you to have problems because of poor blood flow (circulation) to your feet and legs, which can cause your skin to: Become thinner and drier. Break more easily. Heal more slowly. Peel and crack. You may also have nerve damage (neuropathy) in your legs and feet, causing decreased feeling in them. This means that you may not notice minor injuries to your feet that could lead to more serious problems. Noticing and addressing any potential problems early is the best way to prevent future foot problems. How to care for your feet Foot hygiene  Wash your feet daily with warm water and mild soap. Do not use hot water. Then, pat your feet and the areas between your toes until they are completely dry. Do not soak your feet as this can dry your skin. Trim your toenails straight across. Do not dig under them or around the cuticle. File the edges of your nails with an emery board or nail file. Apply a moisturizing lotion or petroleum jelly to the skin on your feet and to dry, brittle toenails. Use lotion that does not contain alcohol and is unscented. Do not apply lotion between your toes. Shoes and socks Wear clean socks or stockings every day. Make sure they are not too tight. Do not wear knee-high stockings since they may decrease blood flow to your legs. Wear shoes that fit properly and have enough cushioning. Always look in your shoes before you put them on to be sure there are no objects inside. To break in new shoes, wear them for just a few hours a day. This prevents injuries on your feet. Wounds, scrapes, corns, and calluses  Check your feet daily for blisters, cuts, bruises, sores, and redness. If you cannot see the bottom of your feet, use a mirror or ask someone for help. Do not cut corns or calluses or try to remove them with medicine. If you find a minor scrape,  cut, or break in the skin on your feet, keep it and the skin around it clean and dry. You may clean these areas with mild soap and water. Do not clean the area with peroxide, alcohol, or iodine. If you have a wound, scrape, corn, or callus on your foot, look at it several times a day to make sure it is healing and not infected. Check for: Redness, swelling, or pain. Fluid or blood. Warmth. Pus or a bad smell. General tips Do not cross your legs. This may decrease blood flow to your feet. Do not use heating pads or hot water bottles on your feet. They may burn your skin. If you have lost feeling in your feet or legs, you may not know this is happening until it is too late. Protect your feet from hot and cold by wearing shoes, such as at the beach or on hot pavement. Schedule a complete foot exam at least once a year (annually) or more often if you have foot problems. Report any cuts, sores, or bruises to your health care provider immediately. Where to find more information American Diabetes Association: www.diabetes.org Association of Diabetes Care & Education Specialists: www.diabeteseducator.org Contact a health care provider if: You have a medical condition that increases your risk of infection and you have any cuts, sores, or bruises on your feet. You have an injury that is not healing. You have redness on your legs or feet. You   feel burning or tingling in your legs or feet. You have pain or cramps in your legs and feet. Your legs or feet are numb. Your feet always feel cold. You have pain around any toenails. Get help right away if: You have a wound, scrape, corn, or callus on your foot and: You have pain, swelling, or redness that gets worse. You have fluid or blood coming from the wound, scrape, corn, or callus. Your wound, scrape, corn, or callus feels warm to the touch. You have pus or a bad smell coming from the wound, scrape, corn, or callus. You have a fever. You have a red  line going up your leg. Summary Check your feet every day for blisters, cuts, bruises, sores, and redness. Apply a moisturizing lotion or petroleum jelly to the skin on your feet and to dry, brittle toenails. Wear shoes that fit properly and have enough cushioning. If you have foot problems, report any cuts, sores, or bruises to your health care provider immediately. Schedule a complete foot exam at least once a year (annually) or more often if you have foot problems. This information is not intended to replace advice given to you by your health care provider. Make sure you discuss any questions you have with your health care provider. Document Revised: 10/10/2019 Document Reviewed: 10/10/2019 Elsevier Patient Education  2023 Elsevier Inc.  

## 2021-08-16 ENCOUNTER — Other Ambulatory Visit: Payer: Self-pay | Admitting: Nurse Practitioner

## 2021-09-03 ENCOUNTER — Telehealth: Payer: Self-pay | Admitting: Nurse Practitioner

## 2021-09-03 NOTE — Telephone Encounter (Signed)
Yes, he can eat fruit.  Fruit is a complex carbohydrate with plenty of vitamins and nutrients he needs to stay healthy.  It also has natural sugars, which means he doesn't need to over do it.  I recommend about 30 grams of carbs per meal.  For example a cup of grapes has 16 grams of carbs. So he will have to do a bit of calculation to make sure he doesn't over do it.

## 2021-09-03 NOTE — Telephone Encounter (Signed)
Tried to call pt, VM is full

## 2021-09-03 NOTE — Telephone Encounter (Signed)
Patient is asking if he can eat fruit? If so, how much can he have a day? His sugar was 186 this morning. He said he had beans and water then about 12 green grapes. Please Advise

## 2021-09-14 ENCOUNTER — Other Ambulatory Visit: Payer: Self-pay | Admitting: Nurse Practitioner

## 2021-09-27 ENCOUNTER — Encounter: Payer: Self-pay | Admitting: *Deleted

## 2021-11-11 ENCOUNTER — Ambulatory Visit (INDEPENDENT_AMBULATORY_CARE_PROVIDER_SITE_OTHER): Payer: Medicare Other | Admitting: Nurse Practitioner

## 2021-11-11 ENCOUNTER — Encounter: Payer: Self-pay | Admitting: Nurse Practitioner

## 2021-11-11 VITALS — BP 134/77 | HR 98 | Ht 67.0 in | Wt 283.0 lb

## 2021-11-11 DIAGNOSIS — E782 Mixed hyperlipidemia: Secondary | ICD-10-CM

## 2021-11-11 DIAGNOSIS — E11649 Type 2 diabetes mellitus with hypoglycemia without coma: Secondary | ICD-10-CM

## 2021-11-11 DIAGNOSIS — E559 Vitamin D deficiency, unspecified: Secondary | ICD-10-CM

## 2021-11-11 DIAGNOSIS — I10 Essential (primary) hypertension: Secondary | ICD-10-CM | POA: Diagnosis not present

## 2021-11-11 LAB — POCT GLYCOSYLATED HEMOGLOBIN (HGB A1C): HbA1c POC (<> result, manual entry): 7.5 % (ref 4.0–5.6)

## 2021-11-11 LAB — POCT UA - MICROALBUMIN
Albumin/Creatinine Ratio, Urine, POC: <30
Creatinine, POC: 100 mg/dL
Microalbumin Ur, POC: 30 mg/L

## 2021-11-11 MED ORDER — GLIPIZIDE ER 5 MG PO TB24: 5.0000 mg | ORAL_TABLET | Freq: Every day | ORAL | 1 refills | Status: DC

## 2021-11-11 MED ORDER — METFORMIN HCL ER 500 MG PO TB24: 500.0000 mg | ORAL_TABLET | Freq: Two times a day (BID) | ORAL | 3 refills | Status: DC

## 2021-11-11 NOTE — Progress Notes (Signed)
11/11/2021, 11:28 AM  Endocrinology follow-up note   Subjective:    Patient ID: Joe [name], male    DOB: [birth year].  Joe [name] is being seen in follow-up after he was seen in consultation for management of currently uncontrolled symptomatic diabetes requested by  Sharilyn Sites, MD.   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    Past Surgical History:  Procedure Laterality Date   APPENDECTOMY  27    mmh   CATARACT EXTRACTION W/PHACO  12/09/2010   Procedure: CATARACT EXTRACTION PHACO AND INTRAOCULAR LENS PLACEMENT (IOC);  Surgeon: Tonny Branch;  Location: [facility];  Service: Ophthalmology;  Laterality: Right;  CDE: 36.21   CATARACT EXTRACTION W/PHACO  10/24/2011   Procedure: CATARACT EXTRACTION PHACO AND INTRAOCULAR LENS PLACEMENT (IOC);  Surgeon: Tonny Branch, MD;  Location: [facility];  Service: Ophthalmology;  Laterality: Left;  CDE 10.35   COLONOSCOPY  09/20/2011   Procedure: COLONOSCOPY;  Surgeon: Jamesetta So, MD;  Location: [facility];  Service: Gastroenterology;  Laterality: N/A;   CYSTOSCOPY     with kidney stone removal-mmh   HERNIA REPAIR     umbilical hernia repair-mmh   INCISIONAL HERNIA REPAIR N/A 09/16/2015   Procedure: HERNIA REPAIR INCISIONAL WITH MESH;  Surgeon: Aviva Signs, MD;  Location: [facility];  Service: General;  Laterality: N/A;  Umbilicus   INSERTION OF MESH N/A 09/16/2015   Procedure: INSERTION OF MESH UMBILICUS;  Surgeon: Aviva Signs, MD;  Location: [facility];  Service: General;  Laterality: N/A;    Social  History   Socioeconomic History   Marital status: Divorced    Spouse name: Not on file   Number of children: 2   Years of education: 12   Highest education level: Not on file  Occupational History    Employer: NOT EMPLOYED   Occupation: Disabled   Tobacco Use   Smoking status: Never   Smokeless tobacco: Never  Vaping Use   Vaping Use: Never used  Substance and Sexual Activity   Alcohol use: No    Alcohol/week: 0.0 standard drinks of alcohol   Drug use: No   Sexual activity: Never    Birth control/protection: None  Other Topics Concern   Not on file  Social History Narrative   Patient lives at [facility].     Patient is disabled.    Patient has a high school education.    Patient is divorced.    Patient has 2 children.    Patient is right handed.   Social Determinants of Health   Financial Resource Strain: Not on file  Food Insecurity: Not on file  Transportation Needs: Not on file  Physical Activity: Not on file  Stress: Not on file  Social Connections: Not on file    Family History  Problem Relation Age of Onset   Heart disease Other    Cancer Other    Hypertension Other    Anesthesia problems Neg Hx    Hypotension Neg Hx    Malignant hyperthermia Neg Hx    Pseudochol deficiency Neg Hx     Outpatient Encounter Medications as of 11/11/2021  Medication Sig   amitriptyline (ELAVIL) 25 MG tablet Take 25 mg by mouth at bedtime.   atorvastatin (LIPITOR) 20 MG tablet TAKE (1) TABLET BY MOUTH ONCE DAILY.   calcium-vitamin D (OSCAL WITH D) 500-200 MG-UNIT tablet Take 1 tablet by mouth 2 (two) times daily.   cholecalciferol (VITAMIN D) 1000 units tablet Take 1,000 Units by mouth daily.   famotidine (PEPCID) 20 MG tablet Take 20 mg by mouth daily.   FLUoxetine (PROZAC) 20 MG capsule Take 20 mg by mouth daily.   glipiZIDE (GLUCOTROL XL) 5 MG 24 hr tablet Take 1 tablet (5 mg total) by mouth daily with breakfast.   lisinopril (ZESTRIL) 5 MG tablet TAKE (1) TABLET  BY MOUTH ONCE DAILY.   metFORMIN (GLUCOPHAGE-XR) 500 MG 24 hr tablet Take 1 tablet (500 mg total) by mouth 2 (two) times daily.   Multiple Vitamins-Minerals (MULTIVITAMIN WITH MINERALS) tablet Take 1 tablet by mouth daily.   [DISCONTINUED] glipiZIDE (GLUCOTROL XL) 5 MG 24 hr tablet TAKE (1) TABLET BY MOUTH ONCE DAILY WITH BREAKFAST.   [DISCONTINUED] metFORMIN (GLUCOPHAGE-XR) 500 MG 24 hr tablet Take 500 mg by mouth 2 (two) times daily.   No facility-administered encounter medications on file as of 11/11/2021.    ALLERGIES: No Known Allergies  VACCINATION STATUS:  There is no immunization history on file for this patient.  Diabetes He presents for his follow-up diabetic visit. He has type 2 diabetes mellitus. Onset time: She was diagnosed at approximate age of 15 years. His disease course has been improving. There are no hypoglycemic associated symptoms. Pertinent negatives for hypoglycemia include no confusion, headaches, pallor or seizures. Associated symptoms include blurred vision. Pertinent negatives for diabetes include no chest pain, no fatigue, no polydipsia, no polyphagia, no polyuria, no weakness and no weight loss. There are no hypoglycemic complications. Symptoms are stable. Diabetic complications include nephropathy. Risk factors for coronary artery disease include diabetes mellitus, family history, male sex, hypertension, obesity, sedentary lifestyle and dyslipidemia. Current diabetic treatment includes oral agent (dual therapy). He is compliant with treatment most of the time. His weight is increasing steadily. He is following a generally unhealthy diet. When asked about meal planning, he reported none. He participates in exercise intermittently. His home blood glucose trend is fluctuating minimally. His overall blood glucose range is 180-200 mg/dl. (He presents today with his meter, no logs, showing stable at goal glycemic profile overall.  His POCT A1c today is 7.5%.  His average  glucose is around 180.  He denies any hypoglycemia.  ) An ACE inhibitor/angiotensin II receptor blocker is being taken. He does not see a podiatrist.Eye exam is current.  Hypertension This is a chronic problem. The current episode started more than 1 year ago. The problem has been gradually improving since onset. The problem is controlled. Associated symptoms include blurred vision. Pertinent negatives include no chest pain, headaches, neck pain, palpitations or shortness of breath. There are no associated agents to hypertension. Risk factors for coronary artery disease include diabetes mellitus, male gender, obesity, sedentary lifestyle, family history and dyslipidemia. Past treatments include ACE inhibitors. The current treatment  provides moderate improvement. There are no compliance problems.  Hypertensive end-organ damage includes kidney disease. Identifiable causes of hypertension include chronic renal disease and sleep apnea.     Review of systems  Constitutional: + steadily increasing body weight,  current Body mass index is 44.32 kg/m. , no fatigue, no subjective hyperthermia, no subjective hypothermia Eyes: no blurry vision, no xerophthalmia ENT: no sore throat, no nodules palpated in throat, no dysphagia/odynophagia, no hoarseness Cardiovascular: no chest pain, no shortness of breath, no palpitations, no leg swelling Respiratory: no cough, no shortness of breath Gastrointestinal: no nausea/vomiting/diarrhea Musculoskeletal: no muscle/joint aches Skin: no rashes, no hyperemia Neurological: no tremors, no numbness, no tingling, no dizziness Psychiatric: no depression, no anxiety   Objective:     BP 134/77   Pulse 98   Ht '5\' 7"'$  (1.702 m)   Wt 283 lb (128.4 kg)   BMI 44.32 kg/m   Wt Readings from Last 3 Encounters:  11/11/21 283 lb (128.4 kg)  08/10/21 283 lb (128.4 kg)  04/09/21 276 lb 9.6 oz (125.5 kg)    BP Readings from Last 3 Encounters:  11/11/21 134/77  08/10/21 (!)  144/71  04/09/21 (!) 141/73      Physical Exam- Limited  Constitutional:  Body mass index is 44.32 kg/m. , not in acute distress, normal state of mind Eyes:  EOMI, no exophthalmos Neck: Supple Cardiovascular: RRR, no murmurs, rubs, or gallops, no edema Respiratory: Adequate breathing efforts, no crackles, rales, rhonchi, or wheezing Musculoskeletal: no gross deformities, strength intact in all four extremities, no gross restriction of joint movements Skin:  no rashes, no hyperemia Neurological: no tremor with outstretched hands   Diabetic Foot Exam - Simple   No data filed      CMP     Component Value Date/Time   NA 139 04/01/2021 1409   K 4.8 04/01/2021 1409   CL 100 04/01/2021 1409   CO2 26 04/01/2021 1409   GLUCOSE 119 (H) 04/01/2021 1409   GLUCOSE 74 02/06/2016 1850   BUN 18 04/01/2021 1409   CREATININE 0.97 04/01/2021 1409   CALCIUM 9.2 04/01/2021 1409   PROT 6.8 04/01/2021 1409   ALBUMIN 4.7 04/01/2021 1409   AST 21 04/01/2021 1409   ALT 24 04/01/2021 1409   ALKPHOS 56 04/01/2021 1409   BILITOT 0.5 04/01/2021 1409   GFRNONAA 76 01/02/2020 0000   GFRAA 88 01/02/2020 0000     Diabetic Labs (most recent): Lab Results  Component Value Date   HGBA1C 7.5 11/11/2021   HGBA1C 8.0 08/10/2021   HGBA1C 7.0 04/09/2021   MICROALBUR 30 11/11/2021   MICROALBUR 30 11/24/2020   MICROALBUR 23.7 11/20/2019     Lipid Panel ( most recent) Lipid Panel     Component Value Date/Time   CHOL 130 04/01/2021 1409   TRIG 153 (H) 04/01/2021 1409   HDL 43 04/01/2021 1409   CHOLHDL 3.0 04/01/2021 1409   LDLCALC 61 04/01/2021 1409   LABVLDL 26 04/01/2021 1409      Assessment & Plan:   1) Uncontrolled type 2 diabetes mellitus with hypoglycemia without coma (HCC)  - Joe [name] has currently uncontrolled symptomatic type 2 DM since 71 years of age.  He presents today with his meter, no logs, showing stable at goal glycemic profile overall.  His POCT A1c today is 7.5%.   His average glucose is around 180.  He denies any hypoglycemia.    - Recent labs reviewed.  - I had a long discussion with him about  the progressive nature of diabetes and the pathology behind its complications. -his diabetes is complicated by obesity/sedentary life and he remains at a high risk for more acute and chronic complications which include CAD, CVA, CKD, retinopathy, and neuropathy. These are all discussed in detail with him.  - Nutritional counseling repeated at each appointment due to patients tendency to fall back in to old habits.  - The patient admits there is a room for improvement in their diet and drink choices. -  Suggestion is made for the patient to avoid simple carbohydrates from their diet including Cakes, Sweet Desserts / Pastries, Ice Cream, Soda (diet and regular), Sweet Tea, Candies, Chips, Cookies, Sweet Pastries, Store Bought Juices, Alcohol in Excess of 1-2 drinks a day, Artificial Sweeteners, Coffee Creamer, and "Sugar-free" Products. This will help patient to have stable blood glucose profile and potentially avoid unintended weight gain.   - I encouraged the patient to switch to unprocessed or minimally processed complex starch and increased protein intake (animal or plant source), fruits, and vegetables.   - Patient is advised to stick to a routine mealtimes to eat 3 meals a day and avoid unnecessary snacks (to snack only to correct hypoglycemia).  - I have approached him with the following individualized plan to manage  his diabetes and patient agrees:   -He is advised to work on his diet again and to continue Metformin 500 mg po twice daily with meals and Glipizide 5 mg XL daily with breakfast.   -He is encouraged to continue monitoring blood glucose twice daily, before breakfast and before bed, and to call the clinic if he has readings less than 70 or greater than 200 for 3 tests in a row.  - he will be considered for incretin therapy as appropriate next  visit.  - Specific targets for A1c;  LDL, HDL,  and Triglycerides were discussed with the patient.  2) Blood Pressure /Hypertension:  His blood pressure is controlled to target for his age.  He is advised to continue Lisinopril 5 mg po daily.  3) Lipids/Hyperlipidemia:    His most recent lipid panel from 04/01/21 shows controlled LDL at 61 and elevated triglycerides of 153- greatly improved.  He is advised to continue Lipitor 20 mg po daily at bedtime.  Side effects and precautions discussed with him.    4)  Weight/Diet:  His Body mass index is 44.32 kg/m.  -   clearly complicating his diabetes care.  he is a candidate for weight loss. I discussed with him the fact that loss of 5 - 10% of his  current body weight will have the most impact on his diabetes management.  Exercise, and detailed carbohydrates information provided  -  detailed on discharge instructions.  5) Chronic Care/Health Maintenance: -he is on ACEI medications and Statin medications and is encouraged to initiate and continue to follow up with Ophthalmology, Dentist,  Podiatrist at least yearly or according to recommendations, and advised to stay away from smoking. I have recommended yearly flu vaccine and pneumonia vaccine at least every 5 years; moderate intensity exercise for up to 150 minutes weekly; and  sleep for at least 7 hours a day.  - he is advised to maintain close follow up with Sharilyn Sites, MD for primary care needs, as well as his other providers for optimal and coordinated care.     I spent 30 minutes in the care of the patient today including review of labs from CMP, Lipids, Thyroid Function, Hematology (current  and previous including abstractions from other facilities); face-to-face time discussing  his blood glucose readings/logs, discussing hypoglycemia and hyperglycemia episodes and symptoms, medications doses, his options of short and long term treatment based on the latest standards of care / guidelines;   discussion about incorporating lifestyle medicine;  and documenting the encounter. Risk reduction counseling performed per USPSTF guidelines to reduce obesity and cardiovascular risk factors.     Please refer to Patient Instructions for Blood Glucose Monitoring and Insulin/Medications Dosing Guide"  in media tab for additional information. Please  also refer to " Patient Self Inventory" in the Media  tab for reviewed elements of pertinent patient history.  Joe [name] participated in the discussions, expressed understanding, and voiced agreement with the above plans.  All questions were answered to his satisfaction. he is encouraged to contact clinic should he have any questions or concerns prior to his return visit.    Follow up plan: - Return in about 4 months (around 03/13/2022) for Diabetes F/U with A1c in office, Previsit labs, Bring meter and logs.     Rayetta Pigg, [facility] [address] [place], [place] [address] Phone: [phone] Fax: [phone]  11/11/2021, 11:28 AM

## 2021-11-24 DIAGNOSIS — E1165 Type 2 diabetes mellitus with hyperglycemia: Secondary | ICD-10-CM | POA: Diagnosis not present

## 2021-11-29 ENCOUNTER — Other Ambulatory Visit: Payer: Self-pay

## 2021-11-29 DIAGNOSIS — E11649 Type 2 diabetes mellitus with hypoglycemia without coma: Secondary | ICD-10-CM

## 2021-11-29 MED ORDER — GLUCOSE BLOOD VI STRP: 1.0000 | ORAL_STRIP | Freq: Two times a day (BID) | 2 refills | Status: DC

## 2021-12-07 ENCOUNTER — Other Ambulatory Visit: Payer: Self-pay | Admitting: Nurse Practitioner

## 2021-12-27 DIAGNOSIS — E1165 Type 2 diabetes mellitus with hyperglycemia: Secondary | ICD-10-CM | POA: Diagnosis not present

## 2021-12-30 ENCOUNTER — Telehealth: Payer: Self-pay

## 2021-12-30 DIAGNOSIS — E11649 Type 2 diabetes mellitus with hypoglycemia without coma: Secondary | ICD-10-CM

## 2021-12-30 MED ORDER — BLOOD GLUCOSE METER KIT: PACK | 0 refills | Status: DC

## 2021-12-30 NOTE — Telephone Encounter (Signed)
We can send in a new meter for him.  I suggest he call his insurance company to see which is covered and cheapest for him though.

## 2021-12-30 NOTE — Telephone Encounter (Signed)
Any changes to medication?

## 2021-12-30 NOTE — Telephone Encounter (Signed)
Pt called stating he has had his meter for at least 2 years. He has noticed his BG has been elevated for past 3 weeks and thinks it is the meter. States he has not changed anything in his diet or otherwise.  BG Monday at bedtime 292 Tuesday before breakfast 212, before bed 261 Wednesday before breakfast 173, before bed 278 and this morning before breakfast 255.  Pt states he is taking glipizide '5mg'$  qd and metformin '500mg'$  bid. Requested Rx for new meter and supplies. Please advise of any changes.

## 2021-12-30 NOTE — Telephone Encounter (Signed)
Rx for glucose meter and supplies sent to [facility]. Tried to call pt to make him aware but did not receive an answer and was unable to leave a message.

## 2021-12-30 NOTE — Telephone Encounter (Signed)
Not just yet.  I want him to get a new meter and see if his glucose was truly high from the meter or outside factors.  If it remains high with the new meter, will think about adding another agent.

## 2022-01-03 NOTE — Telephone Encounter (Signed)
F/u   The patient has not received his new glucose meter have not been checking his blood sugar.   Asking for a call back to discuss

## 2022-01-04 NOTE — Telephone Encounter (Signed)
F/u   The patient is asking for a call back to discuss yesterday message.

## 2022-01-04 NOTE — Telephone Encounter (Signed)
I have called the patient 's cell number 4 times- [phone]  to get a outside line- [phone], I get a recording that the call cannot be completed as dialed, have the operator help. I have called [phone] , the voicemail says this is Shanon Brow, ect followed by leave a message. The voicemail is full and a voicemail cannot be left. Verified that bot these numbers are on the patient's chart.

## 2022-01-05 NOTE — Telephone Encounter (Signed)
I have attempted the call again, I am getting the same message as yesterday . The call cannot be completed as dialed: ect.

## 2022-01-17 NOTE — Telephone Encounter (Signed)
Patient called again, still unable to reach him with the numbers that are listed. Joe [name] , may we make appointment for him when he calls back in? Claiborne Rigg sent him a letter also to call our office as we have not been able to reach him by phone.

## 2022-01-17 NOTE — Telephone Encounter (Signed)
Patient left a VM again stating that his readings are still high. Pt did not leave a telephone number

## 2022-01-18 ENCOUNTER — Encounter: Payer: Self-pay | Admitting: *Deleted

## 2022-01-18 NOTE — Telephone Encounter (Signed)
Yes, we can make an appt so we can find out what is going on.

## 2022-01-18 NOTE — Telephone Encounter (Signed)
We cant' get this patient on the phone. If , he calls in and or you are successful in reaching him by phone, make an appointment so that we can bring him in.

## 2022-01-19 ENCOUNTER — Encounter: Payer: Self-pay | Admitting: *Deleted

## 2022-01-19 NOTE — Patient Instructions (Signed)
  Procedure: Colonoscopy  Estimated body mass index is 43.07 kg/m as calculated from the following:   Height as of this encounter: $RemoveBeforeD'5\' 7"'[id number]$  (1.702 m).   Weight as of this encounter: 275 lb (124.7 kg).   Have you had a colonoscopy before?  09/20/11, Dr. Arnoldo Morale  Do you have family history of colon cancer  no  Do you have a family history of polyps? no  Previous colonoscopy with polyps removed? no  Do you have a history colorectal cancer?   no  Are you diabetic?  Yes type 2  Do you have a prosthetic or mechanical heart valve? no  Do you have a pacemaker/defibrillator?   no  Have you had endocarditis/atrial fibrillation?  no  Do you use supplemental oxygen/CPAP?  no  Have you had joint replacement within the last 12 months?  no  Do you tend to be constipated or have to use laxatives?  no   Do you have history of alcohol use? If yes, how much and how often.  no  Do you have history or are you using drugs? If yes, what do are you  using?  no  Have you ever had a stroke/heart attack?  no  Have you ever had a heart or other vascular stent placed,? no  Do you take weight loss medication? no  Do you take any blood-thinning medications such as: (Plavix, aspirin, Coumadin, Aggrenox, Brilinta, Xarelto, Eliquis, Pradaxa, Savaysa or Effient) no  If yes we need the name, milligram, dosage and who is prescribing doctor:               Current Outpatient Medications  Medication Sig Dispense Refill   amitriptyline (ELAVIL) 25 MG tablet Take 25 mg by mouth at bedtime.     atorvastatin (LIPITOR) 20 MG tablet TAKE (1) TABLET BY MOUTH ONCE DAILY. 28 tablet 11   blood glucose meter kit and supplies Dispense based on patient and insurance preference. Use to check blood glucose two times daily as directed. (FOR ICD-10 E10.9, E11.9). 1 each 0   calcium-vitamin D (OSCAL WITH D) 500-200 MG-UNIT tablet Take 1 tablet by mouth 2 (two) times daily.     cholecalciferol (VITAMIN D) 1000 units tablet  Take 1,000 Units by mouth daily.     famotidine (PEPCID) 20 MG tablet Take 20 mg by mouth daily.     FLUoxetine (PROZAC) 20 MG capsule Take 20 mg by mouth daily.     glipiZIDE (GLUCOTROL XL) 5 MG 24 hr tablet Take 1 tablet (5 mg total) by mouth daily with breakfast. 90 tablet 1   glucose blood test strip 1 each by Other route 2 (two) times daily. Use as instructed 100 each 2   lisinopril (ZESTRIL) 5 MG tablet TAKE (1) TABLET BY MOUTH ONCE DAILY. 28 tablet 2   metFORMIN (GLUCOPHAGE-XR) 500 MG 24 hr tablet Take 1 tablet (500 mg total) by mouth 2 (two) times daily. 180 tablet 3   Multiple Vitamins-Minerals (MULTIVITAMIN WITH MINERALS) tablet Take 1 tablet by mouth daily.     No current facility-administered medications for this visit.    No Known Allergies

## 2022-02-11 NOTE — Progress Notes (Signed)
Ok to schedule. ASA 3. Day of prep: glipizide take am, metformin dose am only.  Day of TCS: hold glipizide and metformin.

## 2022-02-23 ENCOUNTER — Encounter: Payer: Self-pay | Admitting: *Deleted

## 2022-03-07 DIAGNOSIS — E1165 Type 2 diabetes mellitus with hyperglycemia: Secondary | ICD-10-CM | POA: Diagnosis not present

## 2022-03-14 ENCOUNTER — Ambulatory Visit: Payer: Medicare Other | Admitting: Nurse Practitioner

## 2022-03-14 DIAGNOSIS — E559 Vitamin D deficiency, unspecified: Secondary | ICD-10-CM

## 2022-03-14 DIAGNOSIS — E11649 Type 2 diabetes mellitus with hypoglycemia without coma: Secondary | ICD-10-CM

## 2022-03-14 DIAGNOSIS — I10 Essential (primary) hypertension: Secondary | ICD-10-CM

## 2022-03-14 DIAGNOSIS — E782 Mixed hyperlipidemia: Secondary | ICD-10-CM

## 2022-03-30 ENCOUNTER — Other Ambulatory Visit: Payer: Self-pay | Admitting: Nurse Practitioner

## 2022-04-08 ENCOUNTER — Other Ambulatory Visit: Payer: Self-pay | Admitting: Nurse Practitioner

## 2022-04-26 ENCOUNTER — Other Ambulatory Visit: Payer: Self-pay | Admitting: Nurse Practitioner

## 2022-05-09 ENCOUNTER — Other Ambulatory Visit: Payer: Self-pay | Admitting: Nurse Practitioner

## 2022-05-17 DIAGNOSIS — E782 Mixed hyperlipidemia: Secondary | ICD-10-CM | POA: Diagnosis not present

## 2022-05-17 DIAGNOSIS — E1159 Type 2 diabetes mellitus with other circulatory complications: Secondary | ICD-10-CM | POA: Diagnosis not present

## 2022-05-17 DIAGNOSIS — E119 Type 2 diabetes mellitus without complications: Secondary | ICD-10-CM | POA: Diagnosis not present

## 2022-05-17 DIAGNOSIS — G894 Chronic pain syndrome: Secondary | ICD-10-CM | POA: Diagnosis not present

## 2022-05-17 DIAGNOSIS — E7849 Other hyperlipidemia: Secondary | ICD-10-CM | POA: Diagnosis not present

## 2022-05-17 DIAGNOSIS — M159 Polyosteoarthritis, unspecified: Secondary | ICD-10-CM | POA: Diagnosis not present

## 2022-05-17 DIAGNOSIS — Z0001 Encounter for general adult medical examination with abnormal findings: Secondary | ICD-10-CM | POA: Diagnosis not present

## 2022-05-17 DIAGNOSIS — I10 Essential (primary) hypertension: Secondary | ICD-10-CM | POA: Diagnosis not present

## 2022-06-02 DIAGNOSIS — Z1211 Encounter for screening for malignant neoplasm of colon: Secondary | ICD-10-CM | POA: Diagnosis not present

## 2022-09-28 DIAGNOSIS — R531 Weakness: Secondary | ICD-10-CM | POA: Diagnosis not present

## 2022-09-28 DIAGNOSIS — Z743 Need for continuous supervision: Secondary | ICD-10-CM | POA: Diagnosis not present

## 2022-09-28 DIAGNOSIS — E871 Hypo-osmolality and hyponatremia: Secondary | ICD-10-CM | POA: Diagnosis not present

## 2022-09-28 DIAGNOSIS — E1165 Type 2 diabetes mellitus with hyperglycemia: Secondary | ICD-10-CM | POA: Diagnosis not present

## 2022-09-28 DIAGNOSIS — R739 Hyperglycemia, unspecified: Secondary | ICD-10-CM | POA: Diagnosis not present

## 2022-09-28 DIAGNOSIS — R110 Nausea: Secondary | ICD-10-CM | POA: Diagnosis not present

## 2022-09-28 DIAGNOSIS — E860 Dehydration: Secondary | ICD-10-CM | POA: Diagnosis not present

## 2022-09-28 DIAGNOSIS — J9811 Atelectasis: Secondary | ICD-10-CM | POA: Diagnosis not present

## 2022-09-28 DIAGNOSIS — I10 Essential (primary) hypertension: Secondary | ICD-10-CM | POA: Diagnosis not present

## 2022-09-28 DIAGNOSIS — R079 Chest pain, unspecified: Secondary | ICD-10-CM | POA: Diagnosis not present

## 2022-10-05 ENCOUNTER — Telehealth: Payer: Self-pay | Admitting: Nurse Practitioner

## 2022-10-05 DIAGNOSIS — E1165 Type 2 diabetes mellitus with hyperglycemia: Secondary | ICD-10-CM | POA: Diagnosis not present

## 2022-10-05 DIAGNOSIS — E782 Mixed hyperlipidemia: Secondary | ICD-10-CM | POA: Diagnosis not present

## 2022-10-05 DIAGNOSIS — I10 Essential (primary) hypertension: Secondary | ICD-10-CM | POA: Diagnosis not present

## 2022-10-05 NOTE — Telephone Encounter (Signed)
Spoke to pt and pt stated that July 8,2024 at 10 would work for him to come in.

## 2022-10-05 NOTE — Telephone Encounter (Signed)
This is [name]'s pt, correspondence from [place] is under media

## 2022-10-05 NOTE — Telephone Encounter (Signed)
Booked pt for Monday July 8 @ 10:00, tried to call patient, but phone just kept ringing and never went to a voicemail.

## 2022-10-05 NOTE — Telephone Encounter (Signed)
Joe [name] called stating pt needed to be seen ASAP for and A1C greater than 14.  Can I overbook?

## 2022-10-10 ENCOUNTER — Ambulatory Visit (INDEPENDENT_AMBULATORY_CARE_PROVIDER_SITE_OTHER): Payer: 59 | Admitting: Nurse Practitioner

## 2022-10-10 ENCOUNTER — Encounter: Payer: Self-pay | Admitting: Nurse Practitioner

## 2022-10-10 VITALS — BP 113/72 | HR 99 | Ht 67.0 in | Wt 244.8 lb

## 2022-10-10 DIAGNOSIS — Z7984 Long term (current) use of oral hypoglycemic drugs: Secondary | ICD-10-CM

## 2022-10-10 DIAGNOSIS — I10 Essential (primary) hypertension: Secondary | ICD-10-CM

## 2022-10-10 DIAGNOSIS — E11649 Type 2 diabetes mellitus with hypoglycemia without coma: Secondary | ICD-10-CM

## 2022-10-10 DIAGNOSIS — E782 Mixed hyperlipidemia: Secondary | ICD-10-CM | POA: Diagnosis not present

## 2022-10-10 MED ORDER — BLOOD GLUCOSE MONITORING SUPPL DEVI: 1.0000 | Freq: Three times a day (TID) | 0 refills | Status: DC

## 2022-10-10 MED ORDER — METFORMIN HCL ER 500 MG PO TB24: 500.0000 mg | ORAL_TABLET | Freq: Two times a day (BID) | ORAL | 3 refills | Status: DC

## 2022-10-10 MED ORDER — LANCET DEVICE MISC: 0 refills | Status: DC

## 2022-10-10 MED ORDER — LANCETS MISC. MISC: 1.0000 | Freq: Three times a day (TID) | 0 refills | Status: AC

## 2022-10-10 MED ORDER — BLOOD GLUCOSE TEST VI STRP: ORAL_STRIP | 3 refills | Status: DC

## 2022-10-10 NOTE — Patient Instructions (Signed)

## 2022-10-10 NOTE — Progress Notes (Signed)
10/10/2022, 10:39 AM  Endocrinology follow-up note   Subjective:    Patient ID: Joe [name], male    DOB: 08-14-1950.  Joe [name] is being seen in follow-up after he was seen in consultation for management of currently uncontrolled symptomatic diabetes requested by  Assunta Found, MD.   Past Medical History:  Diagnosis Date   Anxiety    Arthritis    osteoarthritis   Borderline diabetes    Chronic pain    Depression    DJD (degenerative joint disease)    GERD (gastroesophageal reflux disease)    H/O renal calculi 1992   Hypertension    Idiopathic Parkinson's disease    Muscle weakness    Obesity    Psychosis (HCC)    Sleep apnea    STOP BANG; 7;Has study and was positive but have not gotten CPAP yet.    Past Surgical History:  Procedure Laterality Date   APPENDECTOMY  15    mmh   CATARACT EXTRACTION W/PHACO  12/09/2010   Procedure: CATARACT EXTRACTION PHACO AND INTRAOCULAR LENS PLACEMENT (IOC);  Surgeon: Gemma Payor;  Location: [facility];  Service: Ophthalmology;  Laterality: Right;  CDE: 36.21   CATARACT EXTRACTION W/PHACO  10/24/2011   Procedure: CATARACT EXTRACTION PHACO AND INTRAOCULAR LENS PLACEMENT (IOC);  Surgeon: Gemma Payor, MD;  Location: [facility];  Service: Ophthalmology;  Laterality: Left;  CDE 10.35   COLONOSCOPY  09/20/2011   Procedure: COLONOSCOPY;  Surgeon: Dalia Heading, MD;  Location: [facility];  Service: Gastroenterology;  Laterality: N/A;   CYSTOSCOPY     with kidney stone removal-mmh   HERNIA REPAIR     umbilical hernia repair-mmh   INCISIONAL HERNIA REPAIR N/A 09/16/2015   Procedure: HERNIA REPAIR INCISIONAL WITH MESH;  Surgeon: Franky Macho, MD;  Location: [facility];  Service: General;  Laterality: N/A;  Umbilicus   INSERTION OF MESH N/A 09/16/2015   Procedure: INSERTION OF MESH UMBILICUS;  Surgeon: Franky Macho, MD;  Location: [facility];  Service: General;  Laterality: N/A;    Social History    Socioeconomic History   Marital status: Divorced    Spouse name: Not on file   Number of children: 2   Years of education: 12   Highest education level: Not on file  Occupational History    Employer: NOT EMPLOYED   Occupation: Disabled   Tobacco Use   Smoking status: Never   Smokeless tobacco: Never  Vaping Use   Vaping Use: Never used  Substance and Sexual Activity   Alcohol use: No    Alcohol/week: 0.0 standard drinks of alcohol   Drug use: No   Sexual activity: Never    Birth control/protection: None  Other Topics Concern   Not on file  Social History Narrative   Patient lives at [facility].     Patient is disabled.    Patient has a high school education.    Patient is divorced.    Patient has 2 children.    Patient is right handed.   Social Determinants of Health   Financial Resource Strain: Not on file  Food Insecurity: Not on file  Transportation Needs: Not on file  Physical Activity: Not on file  Stress: Not on file  Social Connections: Not on file    Family History  Problem Relation Age of Onset   Heart disease Other    Cancer Other    Hypertension Other    Anesthesia problems Neg Hx    Hypotension Neg Hx    Malignant hyperthermia Neg Hx    Pseudochol deficiency Neg Hx     Outpatient Encounter Medications as of 10/10/2022  Medication Sig   amitriptyline (ELAVIL) 25 MG tablet Take 25 mg by mouth at bedtime.   atorvastatin (LIPITOR) 20 MG tablet TAKE 1 TABLET BY MOUTH ONCE DAILY.   Blood Glucose Monitoring Suppl DEVI 1 each by Does not apply route in the morning, at noon, and at bedtime. Use to check glucose twice daily   calcium-vitamin D (OSCAL WITH D) 500-200 MG-UNIT tablet Take 1 tablet by mouth 2 (two) times daily.   cholecalciferol (VITAMIN D) 1000 units tablet Take 1,000 Units by mouth daily.   famotidine (PEPCID) 20 MG tablet Take 20 mg by mouth daily.   FLUoxetine (PROZAC) 20 MG capsule Take 20 mg by mouth daily.   Glucose Blood  (BLOOD GLUCOSE TEST STRIPS) STRP Use to check glucose twice daily   Lancet Device MISC Use to check glucose twice daily   Lancets Misc. MISC 1 each by Does not apply route in the morning, at noon, and at bedtime. May substitute to any manufacturer covered by patient's insurance.   lisinopril (ZESTRIL) 5 MG tablet TAKE 1 TABLET BY MOUTH ONCE DAILY.   Multiple Vitamins-Minerals (MULTIVITAMIN WITH MINERALS) tablet Take 1 tablet by mouth daily.   promethazine (PHENERGAN) 25 MG tablet Take 25 mg by mouth every 6 (six) hours as needed.   TOUJEO SOLOSTAR 300 UNIT/ML Solostar Pen Inject 34 Units into the skin at bedtime.   [DISCONTINUED] metFORMIN (GLUCOPHAGE-XR) 500 MG 24 hr tablet Take 1 tablet (500 mg total) by mouth 2 (two) times daily.   blood glucose meter kit and supplies Dispense based on patient and insurance preference. Use to check blood glucose two times daily as directed. (FOR ICD-10 E10.9, E11.9). (Patient not taking: Reported on 10/10/2022)   glucose blood test strip 1 each by Other route 2 (two) times daily. Use as instructed (Patient not taking: Reported on 10/10/2022)   metFORMIN (GLUCOPHAGE-XR) 500 MG 24 hr tablet Take 1 tablet (500 mg total) by mouth 2 (two) times daily.   [DISCONTINUED] glipiZIDE (GLUCOTROL XL) 5 MG 24 hr tablet Take 1 tablet (5 mg total) by mouth daily with breakfast. (Patient not taking: Reported on 10/10/2022)   No facility-administered encounter medications on file as of 10/10/2022.    ALLERGIES: No Known Allergies  VACCINATION STATUS: There is no immunization history for the selected administration types on file for this patient.  Diabetes He presents for his follow-up diabetic visit. He has type 2 diabetes mellitus. Onset time: She was diagnosed at approximate age of 72 years. His disease course has been worsening. There are no hypoglycemic associated symptoms. Pertinent negatives for hypoglycemia include no confusion, headaches, pallor or seizures. Associated  symptoms include blurred vision, fatigue, polydipsia, polyuria and weight loss. Pertinent negatives for diabetes include no chest pain, no polyphagia and no weakness. There are no hypoglycemic complications. Symptoms are stable. Diabetic complications include nephropathy. Risk factors for coronary artery disease include diabetes mellitus, family history, male sex, hypertension, obesity, sedentary lifestyle and dyslipidemia. Current diabetic treatment includes oral agent (monotherapy) and insulin injections. He is compliant with treatment most of the time. His weight is decreasing rapidly. He  is following a generally unhealthy diet. When asked about meal planning, he reported none. He participates in exercise intermittently. (He presents today after long absence with no meter or logs to review.  His most recent A1c from 7/3 done at PCP office was >14%.  He notes he let his glucose management slip.  His PCP recently started him on Toujeo 34 units SQ daily in the morning, stopped his Glipizide and sent in script for Janumet (which he never received).  He notes he does not have a working glucose meter at home either.) An ACE inhibitor/angiotensin II receptor blocker is being taken. He does not see a podiatrist.Eye exam is current.  Hypertension This is a chronic problem. The current episode started more than 1 year ago. The problem has been gradually improving since onset. The problem is controlled. Associated symptoms include blurred vision. Pertinent negatives include no chest pain, headaches, neck pain, palpitations or shortness of breath. There are no associated agents to hypertension. Risk factors for coronary artery disease include diabetes mellitus, male gender, obesity, sedentary lifestyle, family history and dyslipidemia. Past treatments include ACE inhibitors. The current treatment provides moderate improvement. There are no compliance problems.  Hypertensive end-organ damage includes kidney disease.  Identifiable causes of hypertension include chronic renal disease and sleep apnea.     Review of systems  Constitutional: + rapidly decreasing body weight,  current Body mass index is 38.34 kg/m. , no fatigue, no subjective hyperthermia, no subjective hypothermia Eyes: no blurry vision, no xerophthalmia ENT: no sore throat, no nodules palpated in throat, no dysphagia/odynophagia, no hoarseness Cardiovascular: no chest pain, no shortness of breath, no palpitations, no leg swelling Respiratory: no cough, no shortness of breath Gastrointestinal: no nausea/vomiting/diarrhea Musculoskeletal: no muscle/joint aches Skin: no rashes, no hyperemia Neurological: no tremors, no numbness, no tingling, no dizziness Psychiatric: no depression, no anxiety   Objective:     BP 113/72 (BP Location: Left Arm, Patient Position: Sitting, Cuff Size: Large)   Pulse 99   Ht 5\' 7"  (1.702 m)   Wt 244 lb 12.8 oz (111 kg)   BMI 38.34 kg/m   Wt Readings from Last 3 Encounters:  10/10/22 244 lb 12.8 oz (111 kg)  01/19/22 275 lb (124.7 kg)  11/11/21 283 lb (128.4 kg)    BP Readings from Last 3 Encounters:  10/10/22 113/72  11/11/21 134/77  08/10/21 (!) 144/71      Physical Exam- Limited  Constitutional:  Body mass index is 38.34 kg/m. , not in acute distress, normal state of mind Eyes:  EOMI, no exophthalmos Musculoskeletal: no gross deformities, strength intact in all four extremities, no gross restriction of joint movements Skin:  no rashes, no hyperemia Neurological: no tremor with outstretched hands   Diabetic Foot Exam - Simple   No data filed      CMP     Component Value Date/Time   NA 139 04/01/2021 1409   K 4.8 04/01/2021 1409   CL 100 04/01/2021 1409   CO2 26 04/01/2021 1409   GLUCOSE 119 (H) 04/01/2021 1409   GLUCOSE 74 02/06/2016 1850   BUN 18 04/01/2021 1409   CREATININE 0.97 04/01/2021 1409   CALCIUM 9.2 04/01/2021 1409   PROT 6.8 04/01/2021 1409   ALBUMIN 4.7  04/01/2021 1409   AST 21 04/01/2021 1409   ALT 24 04/01/2021 1409   ALKPHOS 56 04/01/2021 1409   BILITOT 0.5 04/01/2021 1409   GFRNONAA 76 01/02/2020 0000   GFRAA 88 01/02/2020 0000  Diabetic Labs (most recent): Lab Results  Component Value Date   HGBA1C 7.5 11/11/2021   HGBA1C 8.0 08/10/2021   HGBA1C 7.0 04/09/2021   MICROALBUR 30 11/11/2021   MICROALBUR 30 11/24/2020   MICROALBUR 23.7 11/20/2019     Lipid Panel ( most recent) Lipid Panel     Component Value Date/Time   CHOL 130 04/01/2021 1409   TRIG 153 (H) 04/01/2021 1409   HDL 43 04/01/2021 1409   CHOLHDL 3.0 04/01/2021 1409   LDLCALC 61 04/01/2021 1409   LABVLDL 26 04/01/2021 1409      Assessment & Plan:   1) Uncontrolled type 2 diabetes mellitus with hypoglycemia without coma (HCC)  - Joe [name] has currently uncontrolled symptomatic type 2 DM since 72 years of age.  He presents today after long absence with no meter or logs to review.  His most recent A1c from 7/3 done at PCP office was >14%.  He notes he let his glucose management slip.  His PCP recently started him on Toujeo 34 units SQ daily in the morning, stopped his Glipizide and sent in script for Janumet (which he never received).  He notes he does not have a working glucose meter at home either.   - Recent labs reviewed.  - I had a long discussion with him about the progressive nature of diabetes and the pathology behind its complications. -his diabetes is complicated by obesity/sedentary life and he remains at a high risk for more acute and chronic complications which include CAD, CVA, CKD, retinopathy, and neuropathy. These are all discussed in detail with him.  - Nutritional counseling repeated at each appointment due to patients tendency to fall back in to old habits.  - The patient admits there is a room for improvement in their diet and drink choices. -  Suggestion is made for the patient to avoid simple carbohydrates from their diet  including Cakes, Sweet Desserts / Pastries, Ice Cream, Soda (diet and regular), Sweet Tea, Candies, Chips, Cookies, Sweet Pastries, Store Bought Juices, Alcohol in Excess of 1-2 drinks a day, Artificial Sweeteners, Coffee Creamer, and "Sugar-free" Products. This will help patient to have stable blood glucose profile and potentially avoid unintended weight gain.   - I encouraged the patient to switch to unprocessed or minimally processed complex starch and increased protein intake (animal or plant source), fruits, and vegetables.   - Patient is advised to stick to a routine mealtimes to eat 3 meals a day and avoid unnecessary snacks (to snack only to correct hypoglycemia).  - I have approached him with the following individualized plan to manage  his diabetes and patient agrees:   -He is advised to work on his diet again and to continue Metformin 500 mg ER po twice daily with meals and continue Toujeo 34 units but to start taking it before bedtime (starting tomorrow night since he took his dose this morning). He can stay off the Glipizide and is advised not to pick up the Janumet for now.  -He is cautioned not to take insulin without first checking glucose, for safety purposes.  -He is encouraged to start monitoring blood glucose twice daily, before breakfast and before bed, and to call the clinic if he has readings less than 70 or greater than 200 for 3 tests in a row.  - he will be considered for incretin therapy as appropriate next visit.  - Specific targets for A1c;  LDL, HDL,  and Triglycerides were discussed with the patient.  2) Blood  Pressure /Hypertension:  His blood pressure is controlled to target for his age.  He is advised to continue Lisinopril 5 mg po daily.  3) Lipids/Hyperlipidemia:    His most recent lipid panel from 05/18/22 shows controlled LDL at 96 and elevated triglycerides of 368-worsening.  He is advised to continue Lipitor 20 mg po daily at bedtime.  Side effects and  precautions discussed with him.    4)  Weight/Diet:  His Body mass index is 38.34 kg/m.  -   clearly complicating his diabetes care.  he is a candidate for weight loss. I discussed with him the fact that loss of 5 - 10% of his  current body weight will have the most impact on his diabetes management.  Exercise, and detailed carbohydrates information provided  -  detailed on discharge instructions.  5) Chronic Care/Health Maintenance: -he is on ACEI medications and Statin medications and is encouraged to initiate and continue to follow up with Ophthalmology, Dentist,  Podiatrist at least yearly or according to recommendations, and advised to stay away from smoking. I have recommended yearly flu vaccine and pneumonia vaccine at least every 5 years; moderate intensity exercise for up to 150 minutes weekly; and  sleep for at least 7 hours a day.  - he is advised to maintain close follow up with Assunta Found, MD for primary care needs, as well as his other providers for optimal and coordinated care.     I spent  42  minutes in the care of the patient today including review of labs from CMP, Lipids, Thyroid Function, Hematology (current and previous including abstractions from other facilities); face-to-face time discussing  his blood glucose readings/logs, discussing hypoglycemia and hyperglycemia episodes and symptoms, medications doses, his options of short and long term treatment based on the latest standards of care / guidelines;  discussion about incorporating lifestyle medicine;  and documenting the encounter. Risk reduction counseling performed per USPSTF guidelines to reduce obesity and cardiovascular risk factors.     Please refer to Patient Instructions for Blood Glucose Monitoring and Insulin/Medications Dosing Guide"  in media tab for additional information. Please  also refer to " Patient Self Inventory" in the Media  tab for reviewed elements of pertinent patient history.  Edward Qualia  participated in the discussions, expressed understanding, and voiced agreement with the above plans.  All questions were answered to his satisfaction. he is encouraged to contact clinic should he have any questions or concerns prior to his return visit.    Follow up plan: - Return in about 1 month (around 11/10/2022) for Diabetes F/U, Bring meter and logs.     Ronny Bacon, [facility] [address] [place], [place] [address] Phone: [phone] Fax: [phone]  10/10/2022, 10:39 AM

## 2022-10-11 ENCOUNTER — Telehealth: Payer: Self-pay

## 2022-10-11 NOTE — Telephone Encounter (Signed)
Tried to call pt but did not receive an answer and voicemail was full.

## 2022-10-14 ENCOUNTER — Other Ambulatory Visit: Payer: Self-pay | Admitting: Nurse Practitioner

## 2022-10-18 ENCOUNTER — Ambulatory Visit: Payer: 59

## 2022-10-18 VITALS — BP 120/78 | Ht 67.0 in | Wt 244.0 lb

## 2022-10-18 DIAGNOSIS — E11649 Type 2 diabetes mellitus with hypoglycemia without coma: Secondary | ICD-10-CM

## 2022-10-18 NOTE — Patient Instructions (Signed)
Pt advised to take medications as instructed and test BG in the morning before breakfast and at bedtime.

## 2022-10-18 NOTE — Progress Notes (Signed)
Pt came in today confused about how to use the [employer]. Pt does have issues with his vision. He has Diabetic retinopathy. After speaking with pt about medications. I do believe he is taking these correctly. He has only checked his BG 3 times which started on the 10/10/22. This is the day of his office visit with [name]. Pt voiced understanding about testing every morning before breakfast and at bedtime. [name] sent a CGM order to [employer] so he will be more compliant with testing. Notified pt to let us know when he receives this so we can help him with application of the sensor.

## 2022-10-19 DIAGNOSIS — E1165 Type 2 diabetes mellitus with hyperglycemia: Secondary | ICD-10-CM | POA: Diagnosis not present

## 2022-11-08 ENCOUNTER — Telehealth: Payer: Self-pay | Admitting: *Deleted

## 2022-11-08 NOTE — Telephone Encounter (Signed)
Patient called and left a message asking for a call back. Patient was called at 2:59 PM, the messages are full and the mailbox cannot except anymore messages.

## 2022-11-09 NOTE — Telephone Encounter (Signed)
Call ed pt. No answer. VM full. He called the after hrs line stating that he did not have any readings and has an appt on 8/13. Meter/logs is needed for this appt. Pt was here on 7/16 with the [facility] glucometer. Pt stated at that time he understood how to use and when to test. A CGM was sent in for this pt to [employer]. Unsure if he received this. He was notified to call us once he received it so we could go over usage/application instructions with him. Pt has not called or come by office. Awaiting return call.

## 2022-11-10 ENCOUNTER — Telehealth: Payer: Self-pay | Admitting: *Deleted

## 2022-11-10 NOTE — Telephone Encounter (Signed)
Patient has trouble receiving telephone calls. Patient has been calling the office this week, and we have been unable to reach him back. Talked with the patient this morning. He shares that he has appointment Tuesday of this coming week.   He has no readings. He shares that he was to have got a thing to go on his arm to get readings but he has not seen or heard anything. I told him that we would need to reschedule his appointment because the provider would need 1 - 2 weeks of Blood Glucose readings. Patient was given appointment for August 23 at 9 am.    Patient has brought in his meter for [place] to check as he was getting the same readings each time he checked his blood sugars.He was told that we would send in for the thing that goes on his arm. I will follow up with [name] on this. I called [facility] and talked with the pharmacist, Eddie. He shares that they can sell the patient the Flora Monitor for $15 and Flora Test Strips for $10. He also ask that the patient bring in all that he has been using to check blood sugars so that he can be sure that the patient is doing correctly.  Patient was called and made aware, he said okay then said, well, they will send this to me.Patient was advised to call [facility] and talk with Link Snuffer.

## 2022-11-11 NOTE — Telephone Encounter (Signed)
Attempted to call the patient. Went straight to his voicemail and his voicemail box is full and you cannot leave a voicemail. Will try again.

## 2022-11-11 NOTE — Telephone Encounter (Signed)
I put in order for Dexcom G7 through [employer] and it looks like it was delivered 10/18/22.  So, I am not sure what to think in his case.

## 2022-11-15 ENCOUNTER — Ambulatory Visit: Payer: 59 | Admitting: Nurse Practitioner

## 2022-11-19 DIAGNOSIS — E1165 Type 2 diabetes mellitus with hyperglycemia: Secondary | ICD-10-CM | POA: Diagnosis not present

## 2022-11-25 ENCOUNTER — Ambulatory Visit: Payer: 59 | Admitting: Nurse Practitioner

## 2022-11-25 DIAGNOSIS — E559 Vitamin D deficiency, unspecified: Secondary | ICD-10-CM

## 2022-11-25 DIAGNOSIS — E782 Mixed hyperlipidemia: Secondary | ICD-10-CM

## 2022-11-25 DIAGNOSIS — E11649 Type 2 diabetes mellitus with hypoglycemia without coma: Secondary | ICD-10-CM

## 2022-11-25 DIAGNOSIS — Z7984 Long term (current) use of oral hypoglycemic drugs: Secondary | ICD-10-CM

## 2022-11-25 DIAGNOSIS — I10 Essential (primary) hypertension: Secondary | ICD-10-CM

## 2022-11-25 DIAGNOSIS — Z794 Long term (current) use of insulin: Secondary | ICD-10-CM

## 2022-12-20 DIAGNOSIS — E1165 Type 2 diabetes mellitus with hyperglycemia: Secondary | ICD-10-CM | POA: Diagnosis not present

## 2023-01-02 DIAGNOSIS — I10 Essential (primary) hypertension: Secondary | ICD-10-CM | POA: Diagnosis not present

## 2023-01-05 DIAGNOSIS — E1159 Type 2 diabetes mellitus with other circulatory complications: Secondary | ICD-10-CM | POA: Diagnosis not present

## 2023-01-05 DIAGNOSIS — H60392 Other infective otitis externa, left ear: Secondary | ICD-10-CM | POA: Diagnosis not present

## 2023-01-19 DIAGNOSIS — E1165 Type 2 diabetes mellitus with hyperglycemia: Secondary | ICD-10-CM | POA: Diagnosis not present

## 2023-02-07 ENCOUNTER — Other Ambulatory Visit: Payer: Self-pay | Admitting: Nurse Practitioner

## 2023-02-19 DIAGNOSIS — E1165 Type 2 diabetes mellitus with hyperglycemia: Secondary | ICD-10-CM | POA: Diagnosis not present

## 2023-04-18 ENCOUNTER — Other Ambulatory Visit: Payer: Self-pay | Admitting: Nurse Practitioner

## 2023-05-10 ENCOUNTER — Other Ambulatory Visit: Payer: Self-pay | Admitting: Nurse Practitioner

## 2023-05-16 ENCOUNTER — Other Ambulatory Visit: Payer: Self-pay | Admitting: Nurse Practitioner

## 2023-06-14 ENCOUNTER — Other Ambulatory Visit: Payer: Self-pay | Admitting: Nurse Practitioner

## 2023-06-20 ENCOUNTER — Other Ambulatory Visit: Payer: Self-pay | Admitting: Nurse Practitioner

## 2023-06-21 ENCOUNTER — Other Ambulatory Visit: Payer: Self-pay | Admitting: Nurse Practitioner

## 2023-07-11 DIAGNOSIS — E782 Mixed hyperlipidemia: Secondary | ICD-10-CM | POA: Diagnosis not present

## 2023-07-11 DIAGNOSIS — Z0001 Encounter for general adult medical examination with abnormal findings: Secondary | ICD-10-CM | POA: Diagnosis not present

## 2023-07-11 DIAGNOSIS — E7849 Other hyperlipidemia: Secondary | ICD-10-CM | POA: Diagnosis not present

## 2023-07-11 DIAGNOSIS — E1165 Type 2 diabetes mellitus with hyperglycemia: Secondary | ICD-10-CM | POA: Diagnosis not present

## 2023-07-11 DIAGNOSIS — Z91199 Patient's noncompliance with other medical treatment and regimen due to unspecified reason: Secondary | ICD-10-CM | POA: Diagnosis not present

## 2023-07-19 ENCOUNTER — Telehealth: Payer: Self-pay | Admitting: Nurse Practitioner

## 2023-07-19 NOTE — Telephone Encounter (Signed)
 [facility] sent over a STAT referral, pt is already a pt.  Spoke to pt earlier and let him know that we would let him know if we had a cancellation.  Pt is not on Mychart.  Put stat referral under media

## 2023-07-19 NOTE — Telephone Encounter (Signed)
 Patient has missed several follow up's here.  Has tendency to disappear from care and hx of noncompliance.  I agree, having him on the cancellation list is the only way we may be able to see him sooner.

## 2023-07-31 ENCOUNTER — Other Ambulatory Visit: Payer: Self-pay | Admitting: Nurse Practitioner

## 2023-08-04 ENCOUNTER — Telehealth: Payer: Self-pay | Admitting: Nurse Practitioner

## 2023-08-04 MED ORDER — ACCU-CHEK GUIDE ME W/DEVICE KIT: PACK | 0 refills | Status: DC

## 2023-08-04 MED ORDER — ACCU-CHEK SOFTCLIX LANCETS MISC: 12 refills | Status: DC

## 2023-08-04 MED ORDER — ACCU-CHEK GUIDE TEST VI STRP: ORAL_STRIP | 12 refills | Status: DC

## 2023-08-04 NOTE — Telephone Encounter (Signed)
 Pt moved and lost and his meter. Can you send in another meter with supplies? [facility]

## 2023-08-04 NOTE — Telephone Encounter (Signed)
 done

## 2023-08-21 DIAGNOSIS — I10 Essential (primary) hypertension: Secondary | ICD-10-CM | POA: Diagnosis not present

## 2023-08-21 DIAGNOSIS — Z79899 Other long term (current) drug therapy: Secondary | ICD-10-CM | POA: Diagnosis not present

## 2023-08-21 DIAGNOSIS — Z760 Encounter for issue of repeat prescription: Secondary | ICD-10-CM | POA: Diagnosis not present

## 2023-08-21 DIAGNOSIS — Z7984 Long term (current) use of oral hypoglycemic drugs: Secondary | ICD-10-CM | POA: Diagnosis not present

## 2023-08-21 DIAGNOSIS — E119 Type 2 diabetes mellitus without complications: Secondary | ICD-10-CM | POA: Diagnosis not present

## 2023-10-09 ENCOUNTER — Ambulatory Visit: Admitting: Nurse Practitioner

## 2023-10-09 DIAGNOSIS — E559 Vitamin D deficiency, unspecified: Secondary | ICD-10-CM

## 2023-10-09 DIAGNOSIS — Z7984 Long term (current) use of oral hypoglycemic drugs: Secondary | ICD-10-CM

## 2023-10-09 DIAGNOSIS — I10 Essential (primary) hypertension: Secondary | ICD-10-CM

## 2023-10-09 DIAGNOSIS — E782 Mixed hyperlipidemia: Secondary | ICD-10-CM

## 2023-10-09 DIAGNOSIS — E11649 Type 2 diabetes mellitus with hypoglycemia without coma: Secondary | ICD-10-CM

## 2023-12-02 DIAGNOSIS — E119 Type 2 diabetes mellitus without complications: Secondary | ICD-10-CM | POA: Diagnosis not present

## 2023-12-02 DIAGNOSIS — G8929 Other chronic pain: Secondary | ICD-10-CM | POA: Diagnosis not present

## 2023-12-02 DIAGNOSIS — Z79899 Other long term (current) drug therapy: Secondary | ICD-10-CM | POA: Diagnosis not present

## 2023-12-02 DIAGNOSIS — R000 Tachycardia, unspecified: Secondary | ICD-10-CM | POA: Diagnosis not present

## 2023-12-02 DIAGNOSIS — I10 Essential (primary) hypertension: Secondary | ICD-10-CM | POA: Diagnosis not present

## 2023-12-02 DIAGNOSIS — Z7984 Long term (current) use of oral hypoglycemic drugs: Secondary | ICD-10-CM | POA: Diagnosis not present

## 2023-12-02 DIAGNOSIS — R0602 Shortness of breath: Secondary | ICD-10-CM | POA: Diagnosis not present

## 2023-12-02 DIAGNOSIS — M549 Dorsalgia, unspecified: Secondary | ICD-10-CM | POA: Diagnosis not present

## 2024-02-19 ENCOUNTER — Other Ambulatory Visit: Payer: Self-pay

## 2024-02-19 ENCOUNTER — Emergency Department (HOSPITAL_COMMUNITY)

## 2024-02-19 ENCOUNTER — Encounter (HOSPITAL_COMMUNITY): Payer: Self-pay

## 2024-02-19 ENCOUNTER — Emergency Department (HOSPITAL_COMMUNITY)
Admission: EM | Admit: 2024-02-19 | Discharge: 2024-02-19 | Disposition: A | Discharge status: Home / Self Care | Attending: Emergency Medicine | Admitting: Emergency Medicine

## 2024-02-19 DIAGNOSIS — G8929 Other chronic pain: Secondary | ICD-10-CM | POA: Insufficient documentation

## 2024-02-19 DIAGNOSIS — M545 Low back pain, unspecified: Secondary | ICD-10-CM | POA: Insufficient documentation

## 2024-02-19 DIAGNOSIS — Z794 Long term (current) use of insulin: Secondary | ICD-10-CM | POA: Diagnosis not present

## 2024-02-19 DIAGNOSIS — R000 Tachycardia, unspecified: Secondary | ICD-10-CM | POA: Diagnosis not present

## 2024-02-19 DIAGNOSIS — Z79899 Other long term (current) drug therapy: Secondary | ICD-10-CM | POA: Diagnosis not present

## 2024-02-19 DIAGNOSIS — F419 Anxiety disorder, unspecified: Secondary | ICD-10-CM

## 2024-02-19 DIAGNOSIS — Z7984 Long term (current) use of oral hypoglycemic drugs: Secondary | ICD-10-CM | POA: Insufficient documentation

## 2024-02-19 DIAGNOSIS — I10 Essential (primary) hypertension: Secondary | ICD-10-CM | POA: Diagnosis not present

## 2024-02-19 DIAGNOSIS — G20A1 Parkinson's disease without dyskinesia, without mention of fluctuations: Secondary | ICD-10-CM | POA: Insufficient documentation

## 2024-02-19 DIAGNOSIS — E1165 Type 2 diabetes mellitus with hyperglycemia: Secondary | ICD-10-CM | POA: Diagnosis present

## 2024-02-19 DIAGNOSIS — R739 Hyperglycemia, unspecified: Secondary | ICD-10-CM

## 2024-02-19 LAB — BETA-HYDROXYBUTYRIC ACID
Beta-Hydroxybutyric Acid: 0.14 mmol/L (ref 0.05–0.27)
Beta-Hydroxybutyric Acid: 0.09 mmol/L (ref 0.05–0.27)

## 2024-02-19 LAB — CBG MONITORING, ED
Glucose-Capillary: 510 mg/dL (ref 70–99)
Glucose-Capillary: 332 mg/dL — ABNORMAL HIGH (ref 70–99)

## 2024-02-19 LAB — BASIC METABOLIC PANEL WITH GFR
Anion gap: 10 (ref 5–15)
Anion gap: 10 (ref 5–15)
BUN: 22 mg/dL (ref 8–23)
BUN: 21 mg/dL (ref 8–23)
CO2: 26 mmol/L (ref 22–32)
CO2: 26 mmol/L (ref 22–32)
Calcium: 9.3 mg/dL (ref 8.9–10.3)
Calcium: 9.1 mg/dL (ref 8.9–10.3)
Chloride: 96 mmol/L — ABNORMAL LOW (ref 98–111)
Chloride: 98 mmol/L (ref 98–111)
Creatinine, Ser: 1.12 mg/dL (ref 0.61–1.24)
Creatinine, Ser: 0.97 mg/dL (ref 0.61–1.24)
GFR, Estimated: >60 mL/min
GFR, Estimated: >60 mL/min (ref >60)
Glucose, Bld: 454 mg/dL — ABNORMAL HIGH (ref 70–99)
Glucose, Bld: 331 mg/dL — ABNORMAL HIGH (ref 70–99)
Potassium: 4.9 mmol/L (ref 3.5–5.1)
Potassium: 4.4 mmol/L (ref 3.5–5.1)
Sodium: 133 mmol/L — ABNORMAL LOW (ref 135–145)
Sodium: 134 mmol/L — ABNORMAL LOW (ref 135–145)

## 2024-02-19 LAB — CBC WITH DIFFERENTIAL/PLATELET
Abs Immature Granulocytes: 0.04 K/uL (ref 0.00–0.07)
Basophils Absolute: 0.1 K/uL (ref 0.0–0.1)
Basophils Relative: 1 %
Eosinophils Absolute: 0.3 K/uL (ref 0.0–0.5)
Eosinophils Relative: 3 %
HCT: 41.6 % (ref 39.0–52.0)
Hemoglobin: 14.4 g/dL (ref 13.0–17.0)
Immature Granulocytes: 0 %
Lymphocytes Relative: 20 %
Lymphs Abs: 1.9 K/uL (ref 0.7–4.0)
MCH: 31.6 pg (ref 26.0–34.0)
MCHC: 34.6 g/dL (ref 30.0–36.0)
MCV: 91.2 fL (ref 80.0–100.0)
Monocytes Absolute: 0.7 K/uL (ref 0.1–1.0)
Monocytes Relative: 8 %
Neutro Abs: 6.3 K/uL (ref 1.7–7.7)
Neutrophils Relative %: 68 %
Platelets: 218 K/uL (ref 150–400)
RBC: 4.56 MIL/uL (ref 4.22–5.81)
RDW: 12.6 % (ref 11.5–15.5)
WBC: 9.3 K/uL (ref 4.0–10.5)
nRBC: 0 % (ref 0.0–0.2)

## 2024-02-19 LAB — BLOOD GAS, VENOUS
Acid-Base Excess: 2 mmol/L (ref 0.0–2.0)
Bicarbonate: 27.3 mmol/L (ref 20.0–28.0)
Drawn by: 73776
O2 Saturation: 84.5 %
Patient temperature: 36.9
pCO2, Ven: 44 mmHg (ref 44–60)
pH, Ven: 7.4 (ref 7.25–7.43)
pO2, Ven: 51 mmHg — ABNORMAL HIGH (ref 32–45)

## 2024-02-19 LAB — URINE DRUG SCREEN
Amphetamines: NEGATIVE
Barbiturates: NEGATIVE
Benzodiazepines: NEGATIVE
Cocaine: NEGATIVE
Fentanyl: NEGATIVE
Methadone Scn, Ur: NEGATIVE
Opiates: NEGATIVE
Tetrahydrocannabinol: NEGATIVE

## 2024-02-19 LAB — URINALYSIS, ROUTINE W REFLEX MICROSCOPIC
Bacteria, UA: NONE SEEN
Bilirubin Urine: NEGATIVE
Glucose, UA: >=500 mg/dL — AB
Hgb urine dipstick: NEGATIVE
Ketones, ur: NEGATIVE mg/dL
Leukocytes,Ua: NEGATIVE
Nitrite: NEGATIVE
Protein, ur: NEGATIVE mg/dL
Specific Gravity, Urine: 1.02 (ref 1.005–1.030)
pH: 7 (ref 5.0–8.0)

## 2024-02-19 LAB — HEMOGLOBIN A1C
Hgb A1c MFr Bld: 9.7 % — ABNORMAL HIGH (ref 4.8–5.6)
Mean Plasma Glucose: 231.69 mg/dL

## 2024-02-19 LAB — ETHANOL: Alcohol, Ethyl (B): <15 mg/dL

## 2024-02-19 LAB — MAGNESIUM: Magnesium: 2.1 mg/dL (ref 1.7–2.4)

## 2024-02-19 LAB — OSMOLALITY: Osmolality: 309 mosm/kg — ABNORMAL HIGH (ref 275–295)

## 2024-02-19 MED ORDER — FAMOTIDINE 20 MG PO TABS: 20.0000 mg | ORAL_TABLET | Freq: Every day | ORAL | 2 refills | Status: AC

## 2024-02-19 MED ORDER — INSULIN GLARGINE-YFGN 100 UNIT/ML ~~LOC~~ SOLN
34.0000 [IU] | Freq: Every day | SUBCUTANEOUS | Status: DC
  Administered 2024-02-19: 34 [IU] via SUBCUTANEOUS
  Filled 2024-02-19: qty 0.34

## 2024-02-19 MED ORDER — METFORMIN HCL ER 500 MG PO TB24
500.0000 mg | ORAL_TABLET | Freq: Two times a day (BID) | ORAL | Status: DC
  Filled 2024-02-19 (×2): qty 1

## 2024-02-19 MED ORDER — ATORVASTATIN CALCIUM 10 MG PO TABS
20.0000 mg | ORAL_TABLET | Freq: Every day | ORAL | Status: DC
  Administered 2024-02-19: 20 mg via ORAL
  Filled 2024-02-19: qty 2

## 2024-02-19 MED ORDER — INSULIN ASPART 100 UNIT/ML IJ SOLN
10.0000 [IU] | Freq: Once | INTRAMUSCULAR | Status: AC
  Administered 2024-02-19: 10 [IU] via SUBCUTANEOUS
  Filled 2024-02-19: qty 1

## 2024-02-19 MED ORDER — METFORMIN HCL ER 500 MG PO TB24: 500.0000 mg | ORAL_TABLET | Freq: Two times a day (BID) | ORAL | 2 refills | Status: DC

## 2024-02-19 MED ORDER — INSULIN ASPART 100 UNIT/ML IJ SOLN: 0.0000 [IU] | Freq: Every day | INTRAMUSCULAR | Status: DC

## 2024-02-19 MED ORDER — OXYCODONE-ACETAMINOPHEN 5-325 MG PO TABS
1.0000 | ORAL_TABLET | Freq: Once | ORAL | Status: AC
  Administered 2024-02-19: 1 via ORAL
  Filled 2024-02-19: qty 1

## 2024-02-19 MED ORDER — FLUOXETINE HCL 20 MG PO CAPS: 20.0000 mg | ORAL_CAPSULE | Freq: Every day | ORAL | 2 refills | Status: AC

## 2024-02-19 MED ORDER — LACTATED RINGERS IV BOLUS
1000.0000 mL | Freq: Once | INTRAVENOUS | Status: AC
  Administered 2024-02-19: 1000 mL via INTRAVENOUS

## 2024-02-19 MED ORDER — FAMOTIDINE 20 MG PO TABS
20.0000 mg | ORAL_TABLET | Freq: Every day | ORAL | Status: DC
  Administered 2024-02-19: 20 mg via ORAL
  Filled 2024-02-19: qty 1

## 2024-02-19 MED ORDER — INSULIN ASPART 100 UNIT/ML IJ SOLN: 0.0000 [IU] | Freq: Three times a day (TID) | INTRAMUSCULAR | Status: DC

## 2024-02-19 MED ORDER — LISINOPRIL 5 MG PO TABS: 5.0000 mg | ORAL_TABLET | Freq: Every day | ORAL | 11 refills | Status: AC

## 2024-02-19 MED ORDER — LISINOPRIL 5 MG PO TABS
5.0000 mg | ORAL_TABLET | Freq: Every day | ORAL | Status: DC
  Filled 2024-02-19: qty 1

## 2024-02-19 MED ORDER — FLUOXETINE HCL 20 MG PO CAPS
20.0000 mg | ORAL_CAPSULE | Freq: Every day | ORAL | Status: DC
  Administered 2024-02-19: 20 mg via ORAL
  Filled 2024-02-19: qty 1

## 2024-02-19 MED ORDER — ATORVASTATIN CALCIUM 20 MG PO TABS: 20.0000 mg | ORAL_TABLET | Freq: Every day | ORAL | 11 refills | Status: AC

## 2024-02-19 NOTE — BH Assessment (Signed)
 TTS Consult has been deferred for IRIS. IRIS Coordinator to arrange time for assessment.

## 2024-02-19 NOTE — ED Notes (Signed)
 Patient transported to X-ray

## 2024-02-19 NOTE — Discharge Instructions (Addendum)
 Prescription refills were sent to your pharmacy.  Resume taking your insulin as prescribed.  Follow-up with your primary care doctor.  Return to the emergency department for any new or worsening symptoms of concern.

## 2024-02-19 NOTE — Consult Note (Addendum)
 [facility] Consult Note  Patient Name: Joe [name] MRN: [id number] DOB: [birth year] DATE OF Consult: 02/19/2024  PRIMARY PSYCHIATRIC DIAGNOSES  1.  Unspecified anxiety disorder  2.  Rule out anxiety secondary to a general medical condition    RECOMMENDATIONS  Recommendations: Medication recommendations: Continue Prozac 20mg  po daily for depression and anxiety; continue amitriptyline 25mg  po nightly for sleep Non-Medication/therapeutic recommendations: Resources for outpatient therapy and psychiatry; follow up with primary care provider; crisis line information; ED return precautions  Is inpatient psychiatric hospitalization recommended for this patient? No (Explain why): Denies suicidal and homicidal ideation, identifies reasons to live, currently low risk for suicide From a psychiatric perspective, is this patient appropriate for discharge to an outpatient setting/resource or other less restrictive environment for continued care?  Yes (Explain why): Denies suicidal and homicidal ideation, identifies reasons to live, currently low risk for suicide  Follow-Up Telepsychiatry C/L services: We will sign off for now. Please re-consult our service if needed for any concerning changes in the patient's condition, discharge planning, or questions. Communication: Treatment team members (and family members if applicable) who were involved in treatment/care discussions and planning, and with whom we spoke or engaged with via secure text/chat, include the following: Team via Epic chat   Joe [name] is a 73 year old male with a history of anxiety, depression, chronic pain, type 2 diabetes mellitus, hypertension who presents to the ED with anxiety, blood glucose 536, patient reportedly unable to fill some of his prescriptions, patient also reportedly endorsing suicidal ideation. Chart reviewed. Psychiatry consulted for evaluation and management. On evaluation, patient noted to be calm, cooperative,  normoverbal, euthymic, constricted, linear and goal directed, not appearing internally preoccupied, not responding to internal stimuli, alert and oriented x 3. Patient reports he stopped taking his insulin because he thought his doctor told him to, has also run out of some of his medications. Reports he wants to resume insulin since he felt much better taking it. Reports he feels great now that his blood sugar is lowe. Reports he had an anxiety attack last night and has fleeting passive suicidal ideation, denies suicidal intent and plan. Denies current passive and active suicidal ideation, intent, plan. Denies that he has done anything or prepared to do anything to try to harm or kill himself. Patient denies depressed mood. Reports his religious beliefs are protective against suicide, other protective factors include  Social support, future oriented, identifies reasons to live. Denies depressed mood, other depressive symptoms. Denies feelings of anxiety, reports he feels anxious infrequently. Reports he has been taking Prozac and amitriptyline as prescribed. Patient reports he wants to return home and feels safe to do so. Patient does not currently meet criteria for inpatient psychiatric hospitalization. No indication for medication changes at this time as unclear if patient became anxious due to hyperglycemia, but reports it is now resolved. Recommend patient follow up closely with primary care to reconcile his current medication and for need for medication changes.   Thank you for involving us  in the care of this patient. If you have any additional questions or concerns, please call [phone] and ask for me or the provider on-call.  TELEPSYCHIATRY ATTESTATION & CONSENT  As the provider for this telehealth consult, I attest that I verified the patient's identity using two separate identifiers, introduced myself to the patient, provided my credentials, disclosed my location, and performed this encounter via  a HIPAA-compliant, real-time, face-to-face, two-way, interactive audio and video platform and with the full consent and agreement of the  patient (or guardian as applicable.)  Patient physical location: ED in [facility]  Telehealth provider physical location: home office in state of [place]    Video start time: 1458 EST  Video end time: 1512 EST   IDENTIFYING DATA  Joe [name] is a 73 y.o. year-old male for whom a psychiatric consultation has been ordered by the primary provider. The patient was identified using two separate identifiers.  CHIEF COMPLAINT/REASON FOR CONSULT  Anxiety   HISTORY OF PRESENT ILLNESS (HPI)  Joe [name] is a 73 year old male with a history of anxiety, depression, chronic pain, type 2 diabetes mellitus, hypertension who presents to the ED with anxiety, blood glucose 536, patient reportedly unable to fill some of his prescriptions, patient also reportedly endorsing suicidal ideation. Chart reviewed. Psychiatry consulted for evaluation and management.    On evaluation, patient noted to be calm, cooperative, normoverbal, euthymic, constricted, linear and goal directed, not appearing internally preoccupied, not responding to internal stimuli, alert and oriented x 3. Patient reports he had a panic attack yesterday evening and then had another one this morning. Reports he felt very anxious, couldn't control the worry and his thoughts were racing. Denies other symptoms. Reports this happens to him infrequently, maybe once every 3-4 months. States he thinks it could be related to his blood sugar. He states he hasn't been getting all of his medication. He states he was getting them regularly as blister packs, but for some reason has not been getting all of them. Patient reports he has been taking Prozac and amitriptyline, has not run out of those. Patient reports his anxiety is currently improved. Patient denies passive suicidal ideation, denies active suicidal ideation, denies  suicidal intent and plan. Denies access to firearms. Reports he had passive suicidal ideation yesterday when he felt anxious, denies that he had suicidal intent and plan. He states, I enjoy living. Reports he feels hopeful about the future. Reports he has good friends through his church who he sees regularly. Patient reports his religious beliefs are protective against suicide and prayer is grounding for him.Patient reports he stopped taking his insulin several months ago because he thought he didn't need it anymore. He denies he stopped taking his insulin to harm himself or because he wanted to die or didn't care if he lived or died. States he doesn't recall saying this. Reports he wants to start taking his insulin again I don't like feeling like this. Denies that he has done anything or prepared to do anything to try to harm or kill himself. He states I go to church and try to stay active and read. He reports he has two daughters, has not talked to them in a while. Patient reports he takes care of himself is bathing and brushing his teeth. Patient denies depressed mood, other depressive symptoms. Patient alert and oriented to person, location, date. Patient knows the current President is Trump and the prior President was [place]. Denies the use of drugs and alcohol. Patient states, I feel a lot better now that my sugar is down. I feel great. Patient reports he wants to return home and feels safe to do so.    PAST PSYCHIATRIC HISTORY  Trauma/abuse/neglect/exploitation: Denies  Prior psychiatric hospitalizations: Denies  Outpatient mental health treatment: Primary care provider  Prior psych med trials: Prozac, amitriptyline  Suicide attempts:  Denies  Non-suicidal self injury: Denies  Violence: Denies   C-SSRS 1) In the past month have you wished you were dead or wished you could  go to sleep and not wake up? [x]  Yes []   No 2) In the past month have you actually had any thoughts of killing  yourself? []   Yes  [x]   No If YES to 2, ask questions 3, 4, 5, and 6. If NO to 2, go directly to question 6 3) In the past month have you been thinking about how you might do this? []   Yes []   No 4) In the past month have you had these thoughts and had some intention of acting on them?  []   Yes []   No 5) In the past month have you started to work out or worked out the details of how to kill yourself? Do you intend to carry out this plan? []   Yes []   No 6) Have you ever done anything, started to do anything, or prepared to do anything to end your life? []   Yes [x]   No  Otherwise as per HPI above.  PAST MEDICAL HISTORY  Past Medical History:  Diagnosis Date   Anxiety    Arthritis    osteoarthritis   Borderline diabetes    Chronic pain    Depression    DJD (degenerative joint disease)    GERD (gastroesophageal reflux disease)    H/O renal calculi 1992   Hypertension    Idiopathic Parkinson's disease (HCC)    Muscle weakness    Obesity    Psychosis (HCC)    Sleep apnea    STOP BANG; 7;Has study and was positive but have not gotten CPAP yet.     HOME MEDICATIONS  Facility Ordered Medications  Medication   [COMPLETED] lactated ringers  bolus 1,000 mL   [COMPLETED] oxyCODONE -acetaminophen  (PERCOCET/ROXICET) 5-325 MG per tablet 1 tablet   [COMPLETED] lactated ringers  bolus 1,000 mL   [COMPLETED] insulin aspart (novoLOG) injection 10 Units   atorvastatin  (LIPITOR) tablet 20 mg   famotidine (PEPCID) tablet 20 mg   FLUoxetine (PROZAC) capsule 20 mg   lisinopril  (ZESTRIL ) tablet 5 mg   metFORMIN  (GLUCOPHAGE -XR) 24 hr tablet 500 mg   insulin glargine-yfgn (SEMGLEE) injection 34 Units   insulin aspart (novoLOG) injection 0-15 Units   insulin aspart (novoLOG) injection 0-5 Units   PTA Medications  Medication Sig   FLUoxetine (PROZAC) 20 MG capsule Take 20 mg by mouth daily.   cholecalciferol (VITAMIN D) 1000 units tablet Take 1,000 Units by mouth daily.   calcium -vitamin D (OSCAL  WITH D) 500-200 MG-UNIT tablet Take 1 tablet by mouth 2 (two) times daily.   famotidine (PEPCID) 20 MG tablet Take 20 mg by mouth daily.   Multiple Vitamins-Minerals (MULTIVITAMIN WITH MINERALS) tablet Take 1 tablet by mouth daily.   amitriptyline (ELAVIL) 25 MG tablet Take 25 mg by mouth at bedtime.   blood glucose meter kit and supplies Dispense based on patient and insurance preference. Use to check blood glucose two times daily as directed. (FOR ICD-10 E10.9, E11.9). (Patient not taking: Reported on 10/10/2022)   atorvastatin  (LIPITOR) 20 MG tablet TAKE 1 TABLET BY MOUTH ONCE DAILY.   lisinopril  (ZESTRIL ) 5 MG tablet TAKE 1 TABLET BY MOUTH ONCE DAILY.   TOUJEO SOLOSTAR 300 UNIT/ML Solostar Pen Inject 34 Units into the skin at bedtime.   promethazine (PHENERGAN) 25 MG tablet Take 25 mg by mouth every 6 (six) hours as needed.   Blood Glucose Monitoring Suppl DEVI 1 each by Does not apply route in the morning, at noon, and at bedtime. Use to check glucose twice daily   Lancet Device  MISC Use to check glucose twice daily   FORA Lancets MISC USE TO CHECK BLOOD GLUCOSE TWICE DAILY   Glucose Blood (BLOOD GLUCOSE TEST STRIPS) STRP Use to check glucose twice daily   metFORMIN  (GLUCOPHAGE -XR) 500 MG 24 hr tablet TAKE 1 TABLET BY MOUTH 2 TIMES DAILY.   Blood Glucose Monitoring Suppl (ACCU-CHEK GUIDE ME) w/Device KIT Use to check glucose twice daily   glucose blood (ACCU-CHEK GUIDE TEST) test strip Use as instructed to monitor glucose twice daily.   Accu-Chek Softclix Lancets lancets Use as instructed to monitor glucose twice daily     ALLERGIES  No Known Allergies  SOCIAL & SUBSTANCE USE HISTORY  Social History   Socioeconomic History   Marital status: Divorced    Spouse name: Not on file   Number of children: 2   Years of education: 12   Highest education level: Not on file  Occupational History    Employer: NOT EMPLOYED   Occupation: Disabled   Tobacco Use   Smoking status: Never    Smokeless tobacco: Never  Vaping Use   Vaping status: Never Used  Substance and Sexual Activity   Alcohol use: No    Alcohol/week: 0.0 standard drinks of alcohol   Drug use: No   Sexual activity: Never    Birth control/protection: None  Other Topics Concern   Not on file  Social History Narrative   Patient lives at [facility].     Patient is disabled.    Patient has a high school education.    Patient is divorced.    Patient has 2 children.    Patient is right handed.   Social Drivers of Corporate Investment Banker Strain: Not on file  Food Insecurity: Not on file  Transportation Needs: Not on file  Physical Activity: Not on file  Stress: Not on file  Social Connections: Not on file   Social History   Tobacco Use  Smoking Status Never  Smokeless Tobacco Never   Social History   Substance and Sexual Activity  Alcohol Use No   Alcohol/week: 0.0 standard drinks of alcohol   Social History   Substance and Sexual Activity  Drug Use No     FAMILY HISTORY  Family History  Problem Relation Age of Onset   Heart disease Other    Cancer Other    Hypertension Other    Anesthesia problems Neg Hx    Hypotension Neg Hx    Malignant hyperthermia Neg Hx    Pseudochol deficiency Neg Hx      MENTAL STATUS EXAM (MSE)  Mental Status Exam: General Appearance: Fairly Groomed  Orientation:  Full (Time, Place, and Person)  Memory:  Immediate;   Good Recent;   Good  Concentration:  Concentration: Fair  Recall:  Good  Attention  Good  Eye Contact:  Good  Speech:  Clear and Coherent  Language:  Good  Volume:  Normal  Mood: I feel great now  Affect:  Constricted  Thought Process:  Coherent  Thought Content:  Abstract Reasoning and Computation  Suicidal Thoughts:  No  Homicidal Thoughts:  No  Judgement:  Fair  Insight:  Fair  Psychomotor Activity:  Normal  Akathisia:  NA  Fund of Knowledge:  Good    Assets:  Communication Skills Housing Social Support   Cognition:  WNL  ADL's:  Intact  AIMS (if indicated):       VITALS  Blood pressure 112/62, pulse (!) 102, temperature 98.5 F (36.9 C),  temperature source Oral, resp. rate 17, height 5' 7 (1.702 m), weight (!) 161.5 kg, SpO2 95%.  LABS  Admission on 02/19/2024  Component Date Value Ref Range Status   Glucose-Capillary 02/19/2024 510 (HH)  70 - 99 mg/dL Final   Glucose reference range applies only to samples taken after fasting for at least 8 hours.   Comment 1 02/19/2024 Notify RN   Final   Sodium 02/19/2024 133 (L)  135 - 145 mmol/L Final   Potassium 02/19/2024 4.9  3.5 - 5.1 mmol/L Final   Chloride 02/19/2024 96 (L)  98 - 111 mmol/L Final   CO2 02/19/2024 26  22 - 32 mmol/L Final   Glucose, Bld 02/19/2024 454 (H)  70 - 99 mg/dL Final   Glucose reference range applies only to samples taken after fasting for at least 8 hours.   BUN 02/19/2024 22  8 - 23 mg/dL Final   Creatinine, Ser 02/19/2024 1.12  0.61 - 1.24 mg/dL Final   Calcium  02/19/2024 9.3  8.9 - 10.3 mg/dL Final   GFR, Estimated 02/19/2024 >60  >60 mL/min Final   Comment: (NOTE) Calculated using the CKD-EPI Creatinine Equation (2021)    Anion gap 02/19/2024 10  5 - 15 Final   Performed at [facility], [address] [place], [place] [address]   WBC 02/19/2024 9.3  4.0 - 10.5 K/uL Final   RBC 02/19/2024 4.56  4.22 - 5.81 MIL/uL Final   Hemoglobin 02/19/2024 14.4  13.0 - 17.0 g/dL Final   HCT 88/82/7974 41.6  39.0 - 52.0 % Final   MCV 02/19/2024 91.2  80.0 - 100.0 fL Final   MCH 02/19/2024 31.6  26.0 - 34.0 pg Final   MCHC 02/19/2024 34.6  30.0 - 36.0 g/dL Final   RDW 88/82/7974 12.6  11.5 - 15.5 % Final   Platelets 02/19/2024 218  150 - 400 K/uL Final   nRBC 02/19/2024 0.0  0.0 - 0.2 % Final   Neutrophils Relative % 02/19/2024 68  % Final   Neutro Abs 02/19/2024 6.3  1.7 - 7.7 K/uL Final   Lymphocytes Relative 02/19/2024 20  % Final   Lymphs Abs 02/19/2024 1.9  0.7 - 4.0 K/uL Final   Monocytes Relative 02/19/2024  8  % Final   Monocytes Absolute 02/19/2024 0.7  0.1 - 1.0 K/uL Final   Eosinophils Relative 02/19/2024 3  % Final   Eosinophils Absolute 02/19/2024 0.3  0.0 - 0.5 K/uL Final   Basophils Relative 02/19/2024 1  % Final   Basophils Absolute 02/19/2024 0.1  0.0 - 0.1 K/uL Final   Immature Granulocytes 02/19/2024 0  % Final   Abs Immature Granulocytes 02/19/2024 0.04  0.00 - 0.07 K/uL Final   Performed at [facility], [address],  [place], [place] [address]   Color, Urine 02/19/2024 YELLOW  YELLOW Final   APPearance 02/19/2024 CLEAR  CLEAR Final   Specific Gravity, Urine 02/19/2024 1.020  1.005 - 1.030 Final   pH 02/19/2024 7.0  5.0 - 8.0 Final   Glucose, UA 02/19/2024 >=500 (A)  NEGATIVE mg/dL Final   Hgb urine dipstick 02/19/2024 NEGATIVE  NEGATIVE Final   Bilirubin Urine 02/19/2024 NEGATIVE  NEGATIVE Final   Ketones, ur 02/19/2024 NEGATIVE  NEGATIVE mg/dL Final   Protein, ur 88/82/7974 NEGATIVE  NEGATIVE mg/dL Final   Nitrite 88/82/7974 NEGATIVE  NEGATIVE Final   Leukocytes,Ua 02/19/2024 NEGATIVE  NEGATIVE Final   RBC / HPF 02/19/2024 0-5  0 - 5 RBC/hpf Final   WBC, UA 02/19/2024  0-5  0 - 5 WBC/hpf Final   Bacteria, UA 02/19/2024 NONE SEEN  NONE SEEN Final   Squamous Epithelial / HPF 02/19/2024 0-5  0 - 5 /HPF Final   Performed at [facility], [address], [place], [place] [address]   pH, Ven 02/19/2024 7.4  7.25 - 7.43 Final   pCO2, Ven 02/19/2024 44  44 - 60 mmHg Final   pO2, Ven 02/19/2024 51 (H)  32 - 45 mmHg Final   Bicarbonate 02/19/2024 27.3  20.0 - 28.0 mmol/L Final   Acid-Base Excess 02/19/2024 2.0  0.0 - 2.0 mmol/L Final   O2 Saturation 02/19/2024 84.5  % Final   Patient temperature 02/19/2024 36.9   Final   Collection site 02/19/2024 LEFT ANTECUBITAL   Final   Drawn by 02/19/2024 26223   Final   Performed at [facility], [address] [place], [place] [address]   Alcohol, Ethyl (B) 02/19/2024 <15  <15 mg/dL Final   Comment: (NOTE) For medical purposes  only. Performed at [facility], [address], [place], [place] [address]    Opiates 02/19/2024 NEGATIVE  NEGATIVE Final   Cocaine 02/19/2024 NEGATIVE  NEGATIVE Final   Benzodiazepines 02/19/2024 NEGATIVE  NEGATIVE Final   Amphetamines 02/19/2024 NEGATIVE  NEGATIVE Final   Tetrahydrocannabinol 02/19/2024 NEGATIVE  NEGATIVE Final   Barbiturates 02/19/2024 NEGATIVE  NEGATIVE Final   Methadone Scn, Ur 02/19/2024 NEGATIVE  NEGATIVE Final   Fentanyl  02/19/2024 NEGATIVE  NEGATIVE Final   Comment: (NOTE) Drug screen is for Medical Purposes only. Positive results are preliminary only. If confirmation is needed, notify lab within 5 days.  Drug Class                 Cutoff (ng/mL) Amphetamine and metabolites 1000 Barbiturate and metabolites 200 Benzodiazepine              200 Opiates and metabolites     300 Cocaine and metabolites     300 THC                         50 Fentanyl                     5 Methadone                   300  Trazodone is metabolized in vivo to several metabolites,  including pharmacologically active m-CPP, which is excreted in the  urine.  Immunoassay screens for amphetamines and MDMA have potential  cross-reactivity with these compounds and may provide false positive  result.  Performed at [facility], [address], [place], [place] [address]    Magnesium 02/19/2024 2.1  1.7 - 2.4 mg/dL Final   Performed at [facility], [address] [place], [place] [address]   Glucose-Capillary 02/19/2024 332 (H)  70 - 99 mg/dL Final   Glucose reference range applies only to samples taken after fasting for at least 8 hours.    PSYCHIATRIC REVIEW OF SYSTEMS (ROS)  ROS: Notable for the following relevant positive findings: See HPI  Additional findings:      Musculoskeletal: No abnormal movements observed      Gait & Station: Laying/Sitting      Pain Screening: Denies      Nutrition & Dental Concerns: n/a  RISK FORMULATION/ASSESSMENT  Is the patient experiencing any  suicidal or homicidal ideations: No        Protective factors considered for safety management: Social  support, future oriented, identifies reasons to live, religious beliefs   Risk factors/concerns considered for safety management:  Depression Physical illness/chronic pain Age over 59 Male gender Unmarried  Is there a safety management plan with the patient and treatment team to minimize risk factors and promote protective factors: Yes           Explain: Resources for outpatient therapy and psychiatry; crisis line information; ED return precautions; follow up with primary care provider  Is crisis care placement or psychiatric hospitalization recommended: No     Based on my current evaluation and risk assessment, patient is determined at this time to be at:  Low risk  *RISK ASSESSMENT Risk assessment is a dynamic process; it is possible that this patient's condition, and risk level, may change. This should be re-evaluated and managed over time as appropriate. Please re-consult psychiatric consult services if additional assistance is needed in terms of risk assessment and management. If your team decides to discharge this patient, please advise the patient how to best access emergency psychiatric services, or to call 911, if their condition worsens or they feel unsafe in any way.   Erla JAYSON Rase, MD Telepsychiatry Consult Services

## 2024-02-19 NOTE — ED Triage Notes (Addendum)
 Patient BIB [facility] from home for complaint of anxiety that started at 6pm yesterday CBG 536. Has not been able to fill his insulin and prozac, and another medication. 250mL bolus CBG come down to 508. Denise's pain. Some confusion noted in the EMS truck. EMS reports patient would not answer SI questions, but stated he is having a hard time at life.Patient reports having back pain.

## 2024-02-19 NOTE — ED Notes (Signed)
 TTS cart brought to bedside.

## 2024-02-19 NOTE — ED Notes (Signed)
 ED Provider at bedside.

## 2024-02-19 NOTE — ED Provider Notes (Addendum)
 [place] EMERGENCY DEPARTMENT AT [facility] Provider Note   CSN: [id number] Arrival date & time: 02/19/24  1015     Patient presents with: Hyperglycemia and Back Pain   Joe [name] is a 73 y.o. male.   HPI Patient presents for anxiety.  Medical history includes HTN, anxiety, depression, DM, Parkinson's, sleep apnea.  He reports that he has been out of his home medications for the past several weeks.  This includes his insulin and Prozac.  He had feelings of anxiousness starting last night.  Today, EMS noted hyperglycemia with CBG in the range of 500s.  He received 250 cc of IVF prior to arrival.  He endorses chronic low back pain.    Prior to Admission medications   Medication Sig Start Date End Date Taking? Authorizing Provider  Accu-Chek Softclix Lancets lancets Use as instructed to monitor glucose twice daily 08/04/23   Therisa Benton PARAS, NP  amitriptyline (ELAVIL) 25 MG tablet Take 25 mg by mouth at bedtime. 01/01/20   [provider]  atorvastatin  (LIPITOR) 20 MG tablet Take 1 tablet (20 mg total) by mouth daily. 02/19/24   Melvenia Motto, MD  blood glucose meter kit and supplies Dispense based on patient and insurance preference. Use to check blood glucose two times daily as directed. (FOR ICD-10 E10.9, E11.9). Patient not taking: Reported on 10/10/2022 12/30/21   Therisa Benton PARAS, NP  Blood Glucose Monitoring Suppl (ACCU-CHEK GUIDE ME) w/Device KIT Use to check glucose twice daily 08/04/23   Therisa Benton PARAS, NP  Blood Glucose Monitoring Suppl DEVI 1 each by Does not apply route in the morning, at noon, and at bedtime. Use to check glucose twice daily 10/10/22   Therisa Benton PARAS, NP  calcium -vitamin D (OSCAL WITH D) 500-200 MG-UNIT tablet Take 1 tablet by mouth 2 (two) times daily.    [provider]  cholecalciferol (VITAMIN D) 1000 units tablet Take 1,000 Units by mouth daily.    [provider]  famotidine (PEPCID) 20 MG tablet Take 1 tablet  (20 mg total) by mouth daily. 02/19/24 05/19/24  Melvenia Motto, MD  FLUoxetine (PROZAC) 20 MG capsule Take 1 capsule (20 mg total) by mouth daily. 02/19/24 05/19/24  Melvenia Motto, MD  FORA Lancets MISC USE TO CHECK BLOOD GLUCOSE TWICE DAILY 10/17/22   Therisa Benton PARAS, NP  glucose blood (ACCU-CHEK GUIDE TEST) test strip Use as instructed to monitor glucose twice daily. 08/04/23   Therisa Benton PARAS, NP  Glucose Blood (BLOOD GLUCOSE TEST STRIPS) STRP Use to check glucose twice daily 05/16/23   Therisa Benton PARAS, NP  Lancet Device MISC Use to check glucose twice daily 10/10/22   Therisa Benton PARAS, NP  lisinopril  (ZESTRIL ) 5 MG tablet Take 1 tablet (5 mg total) by mouth daily. 02/19/24   Melvenia Motto, MD  metFORMIN  (GLUCOPHAGE -XR) 500 MG 24 hr tablet Take 1 tablet (500 mg total) by mouth 2 (two) times daily. 02/19/24   Melvenia Motto, MD  Multiple Vitamins-Minerals (MULTIVITAMIN WITH MINERALS) tablet Take 1 tablet by mouth daily.    [provider]  promethazine (PHENERGAN) 25 MG tablet Take 25 mg by mouth every 6 (six) hours as needed. 09/29/22   [provider]  TOUJEO SOLOSTAR 300 UNIT/ML Solostar Pen Inject 34 Units into the skin at bedtime. 10/05/22   [provider]    Allergies: Patient has no known allergies.    Review of Systems  Musculoskeletal:  Positive for back pain.  Psychiatric/Behavioral:  The patient is  nervous/anxious.   All other systems reviewed and are negative.   Updated Vital Signs BP (!) 144/86   Pulse 89   Temp 98.5 F (36.9 C) (Oral)   Resp (!) 22   Ht 5' 7 (1.702 m)   Wt (!) 161.5 kg   SpO2 94%   BMI 55.76 kg/m   Physical Exam Vitals and nursing note reviewed.  Constitutional:      General: He is not in acute distress.    Appearance: Normal appearance. He is well-developed. He is not ill-appearing, toxic-appearing or diaphoretic.  HENT:     Head: Normocephalic and atraumatic.     Right Ear: External ear normal.     Left Ear: External ear  normal.     Nose: Nose normal.     Mouth/Throat:     Mouth: Mucous membranes are moist.  Eyes:     Extraocular Movements: Extraocular movements intact.     Conjunctiva/sclera: Conjunctivae normal.  Cardiovascular:     Rate and Rhythm: Normal rate and regular rhythm.  Pulmonary:     Effort: Pulmonary effort is normal. No respiratory distress.  Abdominal:     General: There is no distension.     Palpations: Abdomen is soft.     Tenderness: There is no abdominal tenderness.  Musculoskeletal:        General: No swelling. Normal range of motion.     Cervical back: Normal range of motion and neck supple.  Skin:    General: Skin is warm and dry.     Coloration: Skin is not jaundiced or pale.  Neurological:     General: No focal deficit present.     Mental Status: He is alert and oriented to person, place, and time.  Psychiatric:        Mood and Affect: Mood is anxious.        Behavior: Behavior normal.     (all labs ordered are listed, but only abnormal results are displayed) Labs Reviewed  BASIC METABOLIC PANEL WITH GFR - Abnormal; Notable for the following components:      Result Value   Sodium 133 (*)    Chloride 96 (*)    Glucose, Bld 454 (*)    All other components within normal limits  BASIC METABOLIC PANEL WITH GFR - Abnormal; Notable for the following components:   Sodium 134 (*)    Glucose, Bld 331 (*)    All other components within normal limits  URINALYSIS, ROUTINE W REFLEX MICROSCOPIC - Abnormal; Notable for the following components:   Glucose, UA >=500 (*)    All other components within normal limits  BLOOD GAS, VENOUS - Abnormal; Notable for the following components:   pO2, Ven 51 (*)    All other components within normal limits  CBG MONITORING, ED - Abnormal; Notable for the following components:   Glucose-Capillary 510 (*)    All other components within normal limits  CBG MONITORING, ED - Abnormal; Notable for the following components:   Glucose-Capillary  332 (*)    All other components within normal limits  CBC WITH DIFFERENTIAL/PLATELET  ETHANOL  URINE DRUG SCREEN  MAGNESIUM  BASIC METABOLIC PANEL WITH GFR  BASIC METABOLIC PANEL WITH GFR  BETA-HYDROXYBUTYRIC ACID  BETA-HYDROXYBUTYRIC ACID  BETA-HYDROXYBUTYRIC ACID  OSMOLALITY  BETA-HYDROXYBUTYRIC ACID  HEMOGLOBIN A1C  BASIC METABOLIC PANEL WITH GFR  BETA-HYDROXYBUTYRIC ACID    EKG: EKG Interpretation Date/Time:  Monday February 19 2024 11:04:28 EST Ventricular Rate:  101 PR Interval:  187 QRS  Duration:  87 QT Interval:  334 QTC Calculation: 433 R Axis:   -7  Text Interpretation: Sinus tachycardia Low voltage, precordial leads Confirmed by Melvenia Motto [phone]) on 02/19/2024 1:49:39 PM  Radiology: [facility] Lumbar Spine 2-3 Views Result Date: 02/19/2024 CLINICAL DATA:  Chronic back pain. EXAM: LUMBAR SPINE - 2-3 VIEW COMPARISON:  None Available. FINDINGS: Alignment is anatomic. Vertebral body and disc space heights are maintained. Mild multilevel endplate degenerative changes. Facet hypertrophy in the lower lumbar spine. IMPRESSION: 1. No acute findings. 2. 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 Electronically Signed   By: Newell Eke M.D.   On: 02/19/2024 13:31     Procedures   Medications Ordered in the ED  atorvastatin  (LIPITOR) tablet 20 mg (20 mg Oral Given 02/19/24 1443)  famotidine (PEPCID) tablet 20 mg (20 mg Oral Given 02/19/24 1443)  FLUoxetine (PROZAC) capsule 20 mg (20 mg Oral Given 02/19/24 1445)  lisinopril  (ZESTRIL ) tablet 5 mg (0 mg Oral Hold 02/19/24 1443)  metFORMIN  (GLUCOPHAGE -XR) 24 hr tablet 500 mg (has no administration in time range)  insulin glargine-yfgn (SEMGLEE) injection 34 Units (has no administration in time range)  insulin aspart (novoLOG) injection 0-15 Units (has no administration in time range)  insulin aspart (novoLOG) injection 0-5 Units (has no administration in time range)  lactated  ringers  bolus 1,000 mL (0 mLs Intravenous Stopped 02/19/24 1356)  oxyCODONE -acetaminophen  (PERCOCET/ROXICET) 5-325 MG per tablet 1 tablet (1 tablet Oral Given 02/19/24 1159)  lactated ringers  bolus 1,000 mL (1,000 mLs Intravenous New Bag/Given 02/19/24 1251)  insulin aspart (novoLOG) injection 10 Units (10 Units Subcutaneous Given 02/19/24 1247)                                    Medical Decision Making Amount and/or Complexity of Data Reviewed Labs: ordered. Radiology: ordered.  Risk Prescription drug management.   This patient presents to the ED for concern of anxiety, this involves an extensive number of treatment options, and is a complaint that carries with it a high risk of complications and morbidity.  The differential diagnosis includes metabolic derangement, infection, medication nonadherence, medication withdrawal   Co morbidities / Chronic conditions that complicate the patient evaluation  HTN, anxiety, depression, DM, Parkinson's, sleep apnea   Additional history obtained:  Additional history obtained from EMR External records from outside source obtained and reviewed including N/A   Lab Tests:  I Ordered, and personally interpreted labs.  The pertinent results include: Hyperglycemia without evidence of DKA.  Lab work otherwise unremarkable.   Imaging Studies ordered:  I ordered imaging studies including x-ray of lumbar spine I independently visualized and interpreted imaging which showed degenerative change without acute findings I agree with the radiologist interpretation   Cardiac Monitoring: / EKG:  The patient was maintained on a cardiac monitor.  I personally viewed and interpreted the cardiac monitored which showed an underlying rhythm of: Sinus rhythm   Problem List / ED Course / Critical interventions / Medication management  Patient presenting initially for anxiety.  Found to be hyperglycemic with EMS.  This was confirmed in the ED.  He has  reportedly been out of his home medications for the past several weeks.  This does include insulin.  Patient's anxiety may be related to his hyperglycemia.  Further lab work was initiated.  IV fluids started.  Patient's lab work does not show evidence of DKA.  Additional IV fluids and dose of subcutaneous insulin was ordered.  Given his low back pain, x-ray imaging was ordered.  This showed degenerative change without acute findings.  Patient reports that he has had chronic intermittent low back pain.  When asked why he has not been taking his home medications, he states that he ran out of some of them.  In terms of his insulin, he has not been taking this because he did not care whether he lived or died.  When asked if these are pervasive thoughts, patient states that they are.  Will consult TTS.  Repeat CBG was 332.  At this time, patient is medically cleared.  Patient was evaluated by TTS who does not recommend inpatient psychiatric admission.  Outpatient psychiatric resources provided.  Refills for prescribed medications were sent to his pharmacy.  He does state that he has insulin and is agreeable to resume taking as prescribed.  Patient was discharged in stable condition. I ordered medication including IV fluids and insulin for hyperglycemia; Percocet for analgesia Reevaluation of the patient after these medicines showed that the patient improved I have reviewed the patients home medicines and have made adjustments as needed  Social Determinants of Health:  Lives independently     Final diagnoses:  Hyperglycemia    ED Discharge Orders          Ordered    atorvastatin  (LIPITOR) 20 MG tablet  Daily        02/19/24 1542    famotidine (PEPCID) 20 MG tablet  Daily        02/19/24 1542    FLUoxetine (PROZAC) 20 MG capsule  Daily        02/19/24 1542    lisinopril  (ZESTRIL ) 5 MG tablet  Daily        02/19/24 1542    metFORMIN  (GLUCOPHAGE -XR) 500 MG 24 hr tablet  2 times daily         02/19/24 1542               Melvenia Motto, MD 02/19/24 1422    Melvenia Motto, MD 02/19/24 1544

## 2024-03-01 ENCOUNTER — Other Ambulatory Visit: Payer: Self-pay | Admitting: "Endocrinology

## 2024-03-13 ENCOUNTER — Telehealth: Payer: Self-pay | Admitting: Nurse Practitioner

## 2024-03-13 ENCOUNTER — Other Ambulatory Visit: Payer: Self-pay | Admitting: Nurse Practitioner

## 2024-03-13 MED ORDER — ACCU-CHEK GUIDE TEST VI STRP: ORAL_STRIP | 12 refills | Status: DC

## 2024-03-13 MED ORDER — ACCU-CHEK SOFTCLIX LANCETS MISC: 12 refills | Status: AC

## 2024-03-13 NOTE — Telephone Encounter (Signed)
 I sent the supplies in.

## 2024-03-13 NOTE — Telephone Encounter (Signed)
 Pt made aware and also that he needs to keep his appointments for further refills

## 2024-03-13 NOTE — Telephone Encounter (Signed)
 Pt made an appt. Says needs strips/lancets to [facility].

## 2024-03-22 ENCOUNTER — Telehealth: Payer: Self-pay | Admitting: *Deleted

## 2024-03-22 NOTE — Telephone Encounter (Signed)
 Patient left a message that he had lost his BS test strips and his lancets. I called the patient at the number he provided, [phone]. The call could not go through. It looks like on December 10,2025 [name] sent in a prescription for both of these to [facility].  I called [facility] and talked with Franky. Patient had picked up the refills that [facility] sent in on 03/13/2024. This is what is has lost. Pharmacist shared that the patient's insurance will not cover for another refill at this time as the refill is to soon.  He will also let the patient know that we had tried to reach him and share with him about the refill.

## 2024-03-26 NOTE — Telephone Encounter (Signed)
 Joe [name] had called the after hours line about his lancets and test strips. I have tried a few times to reach him by phone but it will go straight to a voicemail. I have also talked with his pharmacist at [facility]. Joe [name] has presented there a few times and they have explained to him that being he lost these things, his insurance would not pay for another right now, that he would have to pay out of the pocket.  Joe [name] has been advised of this when he had talked with out front staff. I do try to call again but as always it went straight to his voicemail,which cannot leave a message at  this time.

## 2024-04-09 ENCOUNTER — Ambulatory Visit: Admitting: Nurse Practitioner

## 2024-04-10 ENCOUNTER — Encounter: Payer: Self-pay | Admitting: Nurse Practitioner

## 2024-04-10 ENCOUNTER — Ambulatory Visit (INDEPENDENT_AMBULATORY_CARE_PROVIDER_SITE_OTHER): Admitting: Nurse Practitioner

## 2024-04-10 ENCOUNTER — Telehealth: Payer: Self-pay | Admitting: Nurse Practitioner

## 2024-04-10 VITALS — BP 104/68 | HR 104 | Ht 67.0 in | Wt 229.8 lb

## 2024-04-10 DIAGNOSIS — E782 Mixed hyperlipidemia: Secondary | ICD-10-CM | POA: Diagnosis not present

## 2024-04-10 DIAGNOSIS — Z7984 Long term (current) use of oral hypoglycemic drugs: Secondary | ICD-10-CM

## 2024-04-10 DIAGNOSIS — E11649 Type 2 diabetes mellitus with hypoglycemia without coma: Secondary | ICD-10-CM

## 2024-04-10 DIAGNOSIS — I10 Essential (primary) hypertension: Secondary | ICD-10-CM | POA: Diagnosis not present

## 2024-04-10 MED ORDER — METFORMIN HCL ER 500 MG PO TB24: 500.0000 mg | ORAL_TABLET | Freq: Two times a day (BID) | ORAL | 2 refills | Status: DC

## 2024-04-10 MED ORDER — GLIPIZIDE ER 5 MG PO TB24: 5.0000 mg | ORAL_TABLET | Freq: Every day | ORAL | 1 refills | Status: DC

## 2024-04-10 NOTE — Telephone Encounter (Signed)
 Pt's sister called and states that Joe [name] called her asking her how to inject his insulin . She told [name] she did not know. She said he is not able to see to even check his sugar. She said he probably will not show up today because his neighbor was going to bring him but now he has the flu. She said that she is going to call a nephew they have and see if he could bring him but not a guarantee. She shares that he really needs to be in a facility and I told her she really needs to address that with his PCP

## 2024-04-10 NOTE — Progress Notes (Signed)
 "                                                                 04/10/2024, 2:10 PM  Endocrinology follow-up note   Subjective:    Patient ID: Joe [name], male    DOB: Jan 27, 1951.  Karen Kinnard is being seen in follow-up after he was seen in consultation for management of currently uncontrolled symptomatic diabetes requested by  Leonce Lucie PARAS, PA-C.   Past Medical History:  Diagnosis Date   Anxiety    Arthritis    osteoarthritis   Borderline diabetes    Chronic pain    Depression    DJD (degenerative joint disease)    GERD (gastroesophageal reflux disease)    H/O renal calculi 1992   Hypertension    Idiopathic Parkinson's disease (HCC)    Muscle weakness    Obesity    Psychosis (HCC)    Sleep apnea    STOP BANG; 7;Has study and was positive but have not gotten CPAP yet.    Past Surgical History:  Procedure Laterality Date   APPENDECTOMY  92    mmh   CATARACT EXTRACTION W/PHACO  12/09/2010   Procedure: CATARACT EXTRACTION PHACO AND INTRAOCULAR LENS PLACEMENT (IOC);  Surgeon: Cherene Mania;  Location: [facility];  Service: Ophthalmology;  Laterality: Right;  CDE: 36.21   CATARACT EXTRACTION W/PHACO  10/24/2011   Procedure: CATARACT EXTRACTION PHACO AND INTRAOCULAR LENS PLACEMENT (IOC);  Surgeon: Cherene Mania, MD;  Location: [facility];  Service: Ophthalmology;  Laterality: Left;  CDE 10.35   COLONOSCOPY  09/20/2011   Procedure: COLONOSCOPY;  Surgeon: Oneil DELENA Budge, MD;  Location: [facility];  Service: Gastroenterology;  Laterality: N/A;   CYSTOSCOPY     with kidney stone removal-mmh   HERNIA REPAIR     umbilical hernia repair-mmh   INCISIONAL HERNIA REPAIR N/A 09/16/2015   Procedure: HERNIA REPAIR INCISIONAL WITH MESH;  Surgeon: Oneil Budge, MD;  Location: [facility];  Service: General;  Laterality: N/A;  Umbilicus   INSERTION OF MESH N/A 09/16/2015   Procedure: INSERTION OF MESH UMBILICUS;  Surgeon: Oneil Budge, MD;  Location: [facility];  Service: General;  Laterality: N/A;     Social History   Socioeconomic History   Marital status: Divorced    Spouse name: Not on file   Number of children: 2   Years of education: 12   Highest education level: Not on file  Occupational History    Employer: NOT EMPLOYED   Occupation: Disabled   Tobacco Use   Smoking status: Never   Smokeless tobacco: Never  Vaping Use   Vaping status: Never Used  Substance and Sexual Activity   Alcohol use: No    Alcohol/week: 0.0 standard drinks of alcohol   Drug use: No   Sexual activity: Never    Birth control/protection: None  Other Topics Concern   Not on file  Social History Narrative   Patient lives at [facility].     Patient is disabled.    Patient has a high school education.    Patient is divorced.    Patient has 2 children.    Patient is right handed.   Social Drivers of Health   Tobacco Use: Low Risk (04/10/2024)   Patient History  Smoking Tobacco Use: Never    Smokeless Tobacco Use: Never    Passive Exposure: Not on file  Financial Resource Strain: Low Risk (02/28/2024)   Received from [facility]   Overall Financial Resource Strain (CARDIA)    How hard is it for you to pay for the very basics like food, housing, medical care, and heating?: Not hard at all  Food Insecurity: No Food Insecurity (02/28/2024)   Received from [facility]   Epic    Within the past 12 months, you worried that your food would run out before you got the money to buy more.: Never true    Within the past 12 months, the food you bought just didn't last and you didn't have money to get more.: Never true  Transportation Needs: No Transportation Needs (02/28/2024)   Received from [facility] - Transportation    Lack of Transportation (Medical): No    Lack of Transportation (Non-Medical): No  Physical Activity: Sufficiently Active (02/28/2024)   Received from [facility]   Exercise Vital Sign    On average, how many days per week do you engage in  moderate to strenuous exercise (like a brisk walk)?: 7 days    On average, how many minutes do you engage in exercise at this level?: 60 min  Stress: No Stress Concern Present (02/28/2024)   Received from [facility] - Occupational Stress Questionnaire    Do you feel stress - tense, restless, nervous, or anxious, or unable to sleep at night because your mind is troubled all the time - these days?: Only a little  Social Connections: Moderately Integrated (02/28/2024)   Received from [facility]   Social Connection and Isolation Panel    In a typical week, how many times do you talk on the phone with family, friends, or neighbors?: Three times a week    How often do you get together with friends or relatives?: Three times a week    How often do you attend church or religious services?: More than 4 times per year    Do you belong to any clubs or organizations such as church groups, unions, fraternal or athletic groups, or school groups?: Yes    How often do you attend meetings of the clubs or organizations you belong to?: More than 4 times per year    Are you married, widowed, divorced, separated, never married, or living with a partner?: Divorced  Depression (PHQ2-9): Not on file  Alcohol Screen: Not on file  Housing: Not on file  Utilities: Low Risk (02/28/2024)   Received from [facility]   Utilities    Within the past 12 months, have you been unable to get utilities(heat, electricity) when it was really needed?: No  Health Literacy: Low Risk (02/28/2024)   Received from [facility] Literacy    How often do you need to have someone help you when you read instructions, pamphlets, or other written material from your doctor or pharmacy?: Never    Family History  Problem Relation Age of Onset   Heart disease Other    Cancer Other    Hypertension Other    Anesthesia problems Neg Hx    Hypotension Neg Hx    Malignant  hyperthermia Neg Hx    Pseudochol deficiency Neg Hx     Outpatient Encounter Medications as of 04/10/2024  Medication Sig  Accu-Chek Softclix Lancets lancets Use as instructed to monitor glucose twice daily   amitriptyline (ELAVIL) 25 MG tablet Take 25 mg by mouth at bedtime.   atorvastatin  (LIPITOR) 20 MG tablet Take 1 tablet (20 mg total) by mouth daily.   Blood Glucose Monitoring Suppl (ACCU-CHEK GUIDE ME) w/Device KIT Use to check glucose twice daily   Blood Glucose Monitoring Suppl DEVI 1 each by Does not apply route in the morning, at noon, and at bedtime. Use to check glucose twice daily   cholecalciferol (VITAMIN D) 1000 units tablet Take 1,000 Units by mouth daily.   famotidine  (PEPCID ) 20 MG tablet Take 1 tablet (20 mg total) by mouth daily.   FLUoxetine  (PROZAC ) 20 MG capsule Take 1 capsule (20 mg total) by mouth daily.   FORA Lancets MISC USE TO CHECK BLOOD GLUCOSE TWICE DAILY   glipiZIDE  (GLUCOTROL  XL) 5 MG 24 hr tablet Take 1 tablet (5 mg total) by mouth daily with breakfast.   glucose blood (ACCU-CHEK GUIDE TEST) test strip Use as instructed to monitor glucose twice daily.   Glucose Blood (BLOOD GLUCOSE TEST STRIPS) STRP Use to check glucose twice daily   Lancet Device MISC Use to check glucose twice daily   lisinopril  (ZESTRIL ) 5 MG tablet Take 1 tablet (5 mg total) by mouth daily.   Multiple Vitamins-Minerals (MULTIVITAMIN WITH MINERALS) tablet Take 1 tablet by mouth daily.   [DISCONTINUED] metFORMIN  (GLUCOPHAGE -XR) 500 MG 24 hr tablet Take 1 tablet (500 mg total) by mouth 2 (two) times daily.   blood glucose meter kit and supplies Dispense based on patient and insurance preference. Use to check blood glucose two times daily as directed. (FOR ICD-10 E10.9, E11.9). (Patient not taking: Reported on 04/10/2024)   calcium -vitamin D (OSCAL WITH D) 500-200 MG-UNIT tablet Take 1 tablet by mouth 2 (two) times daily. (Patient not taking: Reported on 04/10/2024)   metFORMIN  (GLUCOPHAGE -XR)  500 MG 24 hr tablet Take 1 tablet (500 mg total) by mouth 2 (two) times daily.   promethazine (PHENERGAN) 25 MG tablet Take 25 mg by mouth every 6 (six) hours as needed. (Patient not taking: Reported on 04/10/2024)   TOUJEO  SOLOSTAR 300 UNIT/ML Solostar Pen Inject 34 Units into the skin at bedtime. (Patient not taking: Reported on 04/10/2024)   No facility-administered encounter medications on file as of 04/10/2024.    ALLERGIES: No Known Allergies  VACCINATION STATUS: There is no immunization history for the selected administration types on file for this patient.  Diabetes He presents for his follow-up diabetic visit. He has type 2 diabetes mellitus. Onset time: She was diagnosed at approximate age of 32 years. His disease course has been fluctuating. There are no hypoglycemic associated symptoms. Pertinent negatives for hypoglycemia include no confusion, headaches, pallor or seizures. Associated symptoms include blurred vision, fatigue, polydipsia, polyuria and weight loss. Pertinent negatives for diabetes include no chest pain, no polyphagia and no weakness. There are no hypoglycemic complications. Symptoms are stable. Diabetic complications include nephropathy. Risk factors for coronary artery disease include diabetes mellitus, family history, male sex, hypertension, obesity, sedentary lifestyle and dyslipidemia. Current diabetic treatment includes oral agent (monotherapy) and insulin  injections. He is compliant with treatment some of the time (he often forgets his insulin ). His weight is decreasing rapidly. He is following a generally unhealthy diet. When asked about meal planning, he reported none. He participates in exercise intermittently. (He presents today after long absence with no meter or logs to review.  He was recently hospitalized for DKA.  His  most recent A1c on 11/17 was 9.7%.  He notes his Metformin  was recently increased to 1000 mg po twice daily after hospitalization and Lantus  was  changed to 18 units nightly.  He admits he often forgets to take his insulin .  He has trouble with the sequencing his medications and taking them properly (long pattern of this).  He did have follow up with his PCP recently who recommended SNF placement given decline in his health.  So far, he has declined this.  He has been drinking water  and unsweet tea lately, occasionally a pepsi.  He notes he has been more thirsty and urinating more.  He also complains of blurred vision.) An ACE inhibitor/angiotensin II receptor blocker is being taken. He does not see a podiatrist.Eye exam is current.  Hypertension This is a chronic problem. The current episode started more than 1 year ago. The problem has been gradually improving since onset. The problem is controlled. Associated symptoms include blurred vision. Pertinent negatives include no chest pain, headaches, neck pain, palpitations or shortness of breath. There are no associated agents to hypertension. Risk factors for coronary artery disease include diabetes mellitus, male gender, obesity, sedentary lifestyle, family history and dyslipidemia. Past treatments include ACE inhibitors. The current treatment provides moderate improvement. There are no compliance problems.  Hypertensive end-organ damage includes kidney disease. Identifiable causes of hypertension include chronic renal disease and sleep apnea.     Review of systems  Constitutional: + rapidly decreasing body weight,  current Body mass index is 35.99 kg/m. , no fatigue, no subjective hyperthermia, no subjective hypothermia Eyes: no blurry vision, no xerophthalmia ENT: no sore throat, no nodules palpated in throat, no dysphagia/odynophagia, no hoarseness Cardiovascular: no chest pain, no shortness of breath, no palpitations, no leg swelling Respiratory: no cough, no shortness of breath Gastrointestinal: no nausea/vomiting/diarrhea Musculoskeletal: no muscle/joint aches Skin: no rashes, no  hyperemia Neurological: no tremors, no numbness, no tingling, no dizziness Psychiatric: no depression, no anxiety   Objective:     BP 104/68 (BP Location: Left Arm, Patient Position: Sitting, Cuff Size: Large)   Pulse (!) 104   Ht 5' 7 (1.702 m)   Wt 229 lb 12.8 oz (104.2 kg)   BMI 35.99 kg/m   Wt Readings from Last 3 Encounters:  04/10/24 229 lb 12.8 oz (104.2 kg)  02/19/24 (!) 356 lb (161.5 kg)  10/18/22 244 lb (110.7 kg)    BP Readings from Last 3 Encounters:  04/10/24 104/68  02/19/24 (!) 144/86  10/18/22 120/78      Physical Exam- Limited  Constitutional:  Body mass index is 35.99 kg/m. , not in acute distress, ?memory impairment, smells of urine, and notable urine stains noted to jeans Eyes:  EOMI, no exophthalmos Musculoskeletal: no gross deformities, strength intact in all four extremities, no gross restriction of joint movements, using walker Skin:  no rashes, no hyperemia Neurological: no tremor with outstretched hands   Diabetic Foot Exam - Simple   No data filed      CMP     Component Value Date/Time   NA 134 (L) 02/19/2024 1421   NA 139 04/01/2021 1409   K 4.4 02/19/2024 1421   CL 98 02/19/2024 1421   CO2 26 02/19/2024 1421   GLUCOSE 331 (H) 02/19/2024 1421   BUN 21 02/19/2024 1421   BUN 18 04/01/2021 1409   CREATININE 0.97 02/19/2024 1421   CALCIUM  9.1 02/19/2024 1421   PROT 6.8 04/01/2021 1409   ALBUMIN 4.7 04/01/2021 1409  AST 21 04/01/2021 1409   ALT 24 04/01/2021 1409   ALKPHOS 56 04/01/2021 1409   BILITOT 0.5 04/01/2021 1409   GFRNONAA >60 02/19/2024 1421   GFRAA 88 01/02/2020 0000     Diabetic Labs (most recent): Lab Results  Component Value Date   HGBA1C 9.7 (H) 02/19/2024   HGBA1C 7.5 11/11/2021   HGBA1C 8.0 08/10/2021   MICROALBUR 30 11/11/2021   MICROALBUR 30 11/24/2020   MICROALBUR 23.7 11/20/2019     Lipid Panel ( most recent) Lipid Panel     Component Value Date/Time   CHOL 130 04/01/2021 1409   TRIG 153  (H) 04/01/2021 1409   HDL 43 04/01/2021 1409   CHOLHDL 3.0 04/01/2021 1409   LDLCALC 61 04/01/2021 1409   LABVLDL 26 04/01/2021 1409      Assessment & Plan:   1) Uncontrolled type 2 diabetes mellitus with hypoglycemia without coma (HCC)  - Pranay Hilbun has currently uncontrolled symptomatic type 2 DM since 74 years of age.  He presents today after long absence with no meter or logs to review.  He was recently hospitalized for DKA.  His most recent A1c on 11/17 was 9.7%.  He notes his Metformin  was recently increased to 1000 mg po twice daily after hospitalization and Lantus  was changed to 18 units nightly.  He admits he often forgets to take his insulin .  He has trouble with the sequencing his medications and taking them properly (long pattern of this).  He did have follow up with his PCP recently who recommended SNF placement given decline in his health.  So far, he has declined this.  He has been drinking water  and unsweet tea lately, occasionally a pepsi.  He notes he has been more thirsty and urinating more.  He also complains of blurred vision.  - Recent labs reviewed.  - I had a long discussion with him about the progressive nature of diabetes and the pathology behind its complications. -his diabetes is complicated by obesity/sedentary life and he remains at a high risk for more acute and chronic complications which include CAD, CVA, CKD, retinopathy, and neuropathy. These are all discussed in detail with him.  - Nutritional counseling repeated/built upon at each appointment.  - The patient admits there is a room for improvement in their diet and drink choices. -  Suggestion is made for the patient to avoid simple carbohydrates from their diet including Cakes, Sweet Desserts / Pastries, Ice Cream, Soda (diet and regular), Sweet Tea, Candies, Chips, Cookies, Sweet Pastries, Store Bought Juices, Alcohol in Excess of 1-2 drinks a day, Artificial Sweeteners, Coffee Creamer, and Sugar-free  Products. This will help patient to have stable blood glucose profile and potentially avoid unintended weight gain.   - I encouraged the patient to switch to unprocessed or minimally processed complex starch and increased protein intake (animal or plant source), fruits, and vegetables.   - Patient is advised to stick to a routine mealtimes to eat 3 meals a day and avoid unnecessary snacks (to snack only to correct hypoglycemia).  - I have approached him with the following individualized plan to manage  his diabetes and patient agrees:   -I am truly concerned that he is unable to understand his medication regimen.  Thus, for safety purposes, will try to keep things as simple as possible.  He is advised to lower Metformin  to 500 mg ER po twice daily with meals (cannot increase due to worsening kidney function), and restart Glipizide  5 mg XL daily  at breakfast (was previously on this but taken off during once of his hospitalizations).  He is currently using pill packs, and I did have my nurse call his pharmacy to go over list of meds he should be taking for his diabetes.  I am keeping him OFF insulin  for now as he has not been taking it consistently, nor do I think he can administer safely on his own.  I am having him return in 1 month for re-evaluation.  Looks like care management referral was made during last hospitalization.  He certainly needs some assistance either with home health (aide to help make sure he is taking his medications properly) or evaluation for placement in SNF.  -He is encouraged to start monitoring blood glucose twice daily, before breakfast and before bed, and to call the clinic if he has readings less than 70 or greater than 200 for 3 tests in a row.  - Specific targets for A1c;  LDL, HDL,  and Triglycerides were discussed with the patient.  2) Blood Pressure /Hypertension:  His blood pressure is controlled to target for his age.  He is advised to continue meds as prescribed  by PCP.  3) Lipids/Hyperlipidemia:    His most recent lipid panel from 05/18/22 shows controlled LDL at 96 and elevated triglycerides of 368-worsening.  He is advised to continue Lipitor 20 mg po daily at bedtime.  Side effects and precautions discussed with him.    4)  Weight/Diet:  His Body mass index is 35.99 kg/m.  -   clearly complicating his diabetes care.  he is a candidate for weight loss. I discussed with him the fact that loss of 5 - 10% of his  current body weight will have the most impact on his diabetes management.  Exercise, and detailed carbohydrates information provided  -  detailed on discharge instructions.  5) Chronic Care/Health Maintenance: -he is on ACEI medications and Statin medications and is encouraged to initiate and continue to follow up with Ophthalmology, Dentist,  Podiatrist at least yearly or according to recommendations, and advised to stay away from smoking. I have recommended yearly flu vaccine and pneumonia vaccine at least every 5 years; moderate intensity exercise for up to 150 minutes weekly; and  sleep for at least 7 hours a day.  - he is advised to maintain close follow up with Leonce Lucie PARAS, PA-C for primary care needs, as well as his other providers for optimal and coordinated care.     I spent  56  minutes in the care of the patient today including review of labs from CMP, Lipids, Thyroid  Function, Hematology (current and previous including abstractions from other facilities); face-to-face time discussing  his blood glucose readings/logs, discussing hypoglycemia and hyperglycemia episodes and symptoms, medications doses, his options of short and long term treatment based on the latest standards of care / guidelines;  discussion about incorporating lifestyle medicine;  and documenting the encounter. Risk reduction counseling performed per USPSTF guidelines to reduce obesity and cardiovascular risk factors.     Please refer to Patient Instructions for  Blood Glucose Monitoring and Insulin /Medications Dosing Guide  in media tab for additional information. Please  also refer to  Patient Self Inventory in the Media  tab for reviewed elements of pertinent patient history.  Larnell Pope participated in the discussions, expressed understanding, and voiced agreement with the above plans.  All questions were answered to his satisfaction. he is encouraged to contact clinic should he have any questions or  concerns prior to his return visit.    Follow up plan: - Return in about 4 weeks (around 05/08/2024) for Diabetes F/U, Bring meter and logs.     Benton Rio, [facility] [address] [place], [place] [address] Phone: [phone] Fax: [phone]  04/10/2024, 2:10 PM   "

## 2024-04-10 NOTE — Patient Instructions (Signed)

## 2024-04-10 NOTE — Telephone Encounter (Signed)
 I agree, I feel I would not be able to help much given his current state.  He certainly needs help with his medications.

## 2024-04-11 ENCOUNTER — Telehealth: Payer: Self-pay | Admitting: *Deleted

## 2024-04-11 NOTE — Telephone Encounter (Signed)
 Attempted to contact patient again and patient's voicemail was not set up. Unable to communicate information.

## 2024-04-11 NOTE — Telephone Encounter (Signed)
 Patient called and shared that he needed a meter for his test strips, and a injector for his lancets. I called [facility] and spoke with [facility]. I shared that what the patient has shared. She reviewed his profile and said that they had sent out Accuchek test strips.  She ask that the patient call her and let her know when he is coming by and she will have it ready. Several attempts have been made to reach the patient but he is not answering his phone.  I will call Rosaline back and let her know.

## 2024-04-15 ENCOUNTER — Telehealth: Payer: Self-pay | Admitting: *Deleted

## 2024-04-15 NOTE — Telephone Encounter (Signed)
 Patient left a voicemail this morning, He shared that has a important message bout his meter and would appreciate a call back.  Returned the call to the patient. There was no answer and his voicemail box is not set up.

## 2024-04-17 ENCOUNTER — Telehealth: Payer: Self-pay | Admitting: *Deleted

## 2024-04-17 NOTE — Telephone Encounter (Signed)
 He would like to know when his next office visit is. Call back [phone]

## 2024-04-17 NOTE — ED Triage Notes (Signed)
 Glucose 588 at [facility]; c/o dry mouth and crazy thoughts. Glucose in triage 569.

## 2024-04-18 ENCOUNTER — Emergency Department (HOSPITAL_COMMUNITY)

## 2024-04-18 ENCOUNTER — Other Ambulatory Visit: Payer: Self-pay

## 2024-04-18 ENCOUNTER — Encounter (HOSPITAL_COMMUNITY): Payer: Self-pay | Admitting: Emergency Medicine

## 2024-04-18 ENCOUNTER — Inpatient Hospital Stay (HOSPITAL_COMMUNITY)
Admission: EM | Admit: 2024-04-18 | Discharge: 2024-04-20 | DRG: 637 | Disposition: A | Discharge status: Home Health | Attending: Family Medicine | Admitting: Family Medicine

## 2024-04-18 DIAGNOSIS — E559 Vitamin D deficiency, unspecified: Secondary | ICD-10-CM | POA: Diagnosis present

## 2024-04-18 DIAGNOSIS — Z79899 Other long term (current) drug therapy: Secondary | ICD-10-CM | POA: Diagnosis not present

## 2024-04-18 DIAGNOSIS — E11649 Type 2 diabetes mellitus with hypoglycemia without coma: Secondary | ICD-10-CM | POA: Diagnosis present

## 2024-04-18 DIAGNOSIS — G20A1 Parkinson's disease without dyskinesia, without mention of fluctuations: Secondary | ICD-10-CM | POA: Diagnosis present

## 2024-04-18 DIAGNOSIS — F05 Delirium due to known physiological condition: Secondary | ICD-10-CM | POA: Diagnosis present

## 2024-04-18 DIAGNOSIS — N390 Urinary tract infection, site not specified: Secondary | ICD-10-CM | POA: Diagnosis present

## 2024-04-18 DIAGNOSIS — F0153 Vascular dementia, unspecified severity, with mood disturbance: Secondary | ICD-10-CM | POA: Diagnosis present

## 2024-04-18 DIAGNOSIS — E1165 Type 2 diabetes mellitus with hyperglycemia: Secondary | ICD-10-CM | POA: Diagnosis present

## 2024-04-18 DIAGNOSIS — Z91199 Patient's noncompliance with other medical treatment and regimen due to unspecified reason: Secondary | ICD-10-CM

## 2024-04-18 DIAGNOSIS — Z91148 Patient's other noncompliance with medication regimen for other reason: Secondary | ICD-10-CM | POA: Diagnosis not present

## 2024-04-18 DIAGNOSIS — Z8249 Family history of ischemic heart disease and other diseases of the circulatory system: Secondary | ICD-10-CM

## 2024-04-18 DIAGNOSIS — G9341 Metabolic encephalopathy: Secondary | ICD-10-CM | POA: Diagnosis present

## 2024-04-18 DIAGNOSIS — R410 Disorientation, unspecified: Secondary | ICD-10-CM | POA: Diagnosis not present

## 2024-04-18 DIAGNOSIS — E860 Dehydration: Secondary | ICD-10-CM | POA: Diagnosis present

## 2024-04-18 DIAGNOSIS — R739 Hyperglycemia, unspecified: Principal | ICD-10-CM

## 2024-04-18 DIAGNOSIS — R443 Hallucinations, unspecified: Secondary | ICD-10-CM

## 2024-04-18 DIAGNOSIS — I10 Essential (primary) hypertension: Secondary | ICD-10-CM | POA: Diagnosis present

## 2024-04-18 DIAGNOSIS — Z91119 Patient's noncompliance with dietary regimen due to unspecified reason: Secondary | ICD-10-CM | POA: Diagnosis not present

## 2024-04-18 DIAGNOSIS — F32A Depression, unspecified: Secondary | ICD-10-CM | POA: Diagnosis present

## 2024-04-18 DIAGNOSIS — Z961 Presence of intraocular lens: Secondary | ICD-10-CM | POA: Diagnosis present

## 2024-04-18 DIAGNOSIS — R441 Visual hallucinations: Secondary | ICD-10-CM | POA: Diagnosis present

## 2024-04-18 DIAGNOSIS — E782 Mixed hyperlipidemia: Secondary | ICD-10-CM | POA: Diagnosis present

## 2024-04-18 DIAGNOSIS — B9620 Unspecified Escherichia coli [E. coli] as the cause of diseases classified elsewhere: Secondary | ICD-10-CM | POA: Diagnosis present

## 2024-04-18 DIAGNOSIS — R627 Adult failure to thrive: Secondary | ICD-10-CM | POA: Diagnosis present

## 2024-04-18 DIAGNOSIS — R4182 Altered mental status, unspecified: Secondary | ICD-10-CM

## 2024-04-18 DIAGNOSIS — Z7984 Long term (current) use of oral hypoglycemic drugs: Secondary | ICD-10-CM | POA: Diagnosis not present

## 2024-04-18 LAB — CBC WITH DIFFERENTIAL/PLATELET
Abs Immature Granulocytes: 0.04 K/uL (ref 0.00–0.07)
Basophils Absolute: 0.1 K/uL (ref 0.0–0.1)
Basophils Relative: 1 %
Eosinophils Absolute: 0.2 K/uL (ref 0.0–0.5)
Eosinophils Relative: 3 %
HCT: 39.2 % (ref 39.0–52.0)
Hemoglobin: 13.2 g/dL (ref 13.0–17.0)
Immature Granulocytes: 1 %
Lymphocytes Relative: 24 %
Lymphs Abs: 1.8 K/uL (ref 0.7–4.0)
MCH: 30.8 pg (ref 26.0–34.0)
MCHC: 33.7 g/dL (ref 30.0–36.0)
MCV: 91.6 fL (ref 80.0–100.0)
Monocytes Absolute: 0.7 K/uL (ref 0.1–1.0)
Monocytes Relative: 9 %
Neutro Abs: 4.9 K/uL (ref 1.7–7.7)
Neutrophils Relative %: 62 %
Platelets: 208 K/uL (ref 150–400)
RBC: 4.28 MIL/uL (ref 4.22–5.81)
RDW: 12.4 % (ref 11.5–15.5)
WBC: 7.7 K/uL (ref 4.0–10.5)
nRBC: 0 % (ref 0.0–0.2)

## 2024-04-18 LAB — URINALYSIS, ROUTINE W REFLEX MICROSCOPIC
Bacteria, UA: NONE SEEN
Bilirubin Urine: NEGATIVE
Glucose, UA: >=500 mg/dL — AB
Hgb urine dipstick: NEGATIVE
Ketones, ur: 20 mg/dL — AB
Nitrite: NEGATIVE
Protein, ur: NEGATIVE mg/dL
Specific Gravity, Urine: 1.025 (ref 1.005–1.030)
pH: 6 (ref 5.0–8.0)

## 2024-04-18 LAB — COMPREHENSIVE METABOLIC PANEL WITH GFR
ALT: 25 U/L (ref 0–44)
AST: 17 U/L (ref 15–41)
Albumin: 4 g/dL (ref 3.5–5.0)
Alkaline Phosphatase: 50 U/L (ref 38–126)
Anion gap: 14 (ref 5–15)
BUN: 20 mg/dL (ref 8–23)
CO2: 23 mmol/L (ref 22–32)
Calcium: 9.3 mg/dL (ref 8.9–10.3)
Chloride: 95 mmol/L — ABNORMAL LOW (ref 98–111)
Creatinine, Ser: 0.95 mg/dL (ref 0.61–1.24)
GFR, Estimated: >60 mL/min
Glucose, Bld: 384 mg/dL — ABNORMAL HIGH (ref 70–99)
Potassium: 4.9 mmol/L (ref 3.5–5.1)
Sodium: 133 mmol/L — ABNORMAL LOW (ref 135–145)
Total Bilirubin: 0.6 mg/dL (ref 0.0–1.2)
Total Protein: 6.7 g/dL (ref 6.5–8.1)

## 2024-04-18 LAB — CBG MONITORING, ED
Glucose-Capillary: 426 mg/dL — ABNORMAL HIGH (ref 70–99)
Glucose-Capillary: 330 mg/dL — ABNORMAL HIGH (ref 70–99)
Glucose-Capillary: 287 mg/dL — ABNORMAL HIGH (ref 70–99)

## 2024-04-18 LAB — FOLATE: Folate: >20 ng/mL

## 2024-04-18 LAB — BLOOD GAS, VENOUS
Acid-Base Excess: 2.8 mmol/L — ABNORMAL HIGH (ref 0.0–2.0)
Bicarbonate: 28.5 mmol/L — ABNORMAL HIGH (ref 20.0–28.0)
Drawn by: 27160
O2 Saturation: 22.7 %
Patient temperature: 36.9
pCO2, Ven: 47 mmHg (ref 44–60)
pH, Ven: 7.39 (ref 7.25–7.43)
pO2, Ven: <31 mmHg — CL (ref 32–45)

## 2024-04-18 LAB — HEMOGLOBIN A1C
Hgb A1c MFr Bld: 16.4 % — ABNORMAL HIGH (ref 4.8–5.6)
Mean Plasma Glucose: 423.98 mg/dL

## 2024-04-18 LAB — HIV ANTIBODY (ROUTINE TESTING W REFLEX): HIV Screen 4th Generation wRfx: NONREACTIVE

## 2024-04-18 LAB — TSH: TSH: 1.14 u[IU]/mL (ref 0.350–4.500)

## 2024-04-18 LAB — BETA-HYDROXYBUTYRIC ACID: Beta-Hydroxybutyric Acid: 1.22 mmol/L — ABNORMAL HIGH (ref 0.05–0.27)

## 2024-04-18 LAB — SEDIMENTATION RATE: Sed Rate: 18 mm/h (ref 0–20)

## 2024-04-18 LAB — GLUCOSE, CAPILLARY
Glucose-Capillary: 347 mg/dL — ABNORMAL HIGH (ref 70–99)
Glucose-Capillary: 372 mg/dL — ABNORMAL HIGH (ref 70–99)

## 2024-04-18 LAB — VITAMIN B12: Vitamin B-12: 1034 pg/mL — ABNORMAL HIGH (ref 180–914)

## 2024-04-18 MED ORDER — ENOXAPARIN SODIUM 40 MG/0.4ML IJ SOSY
40.0000 mg | PREFILLED_SYRINGE | INTRAMUSCULAR | Status: DC
  Administered 2024-04-18 – 2024-04-19 (×2): 40 mg via SUBCUTANEOUS
  Filled 2024-04-18 (×2): qty 0.4

## 2024-04-18 MED ORDER — ALBUTEROL SULFATE (2.5 MG/3ML) 0.083% IN NEBU: 2.5000 mg | INHALATION_SOLUTION | RESPIRATORY_TRACT | Status: DC | PRN

## 2024-04-18 MED ORDER — FAMOTIDINE 20 MG PO TABS
20.0000 mg | ORAL_TABLET | Freq: Every day | ORAL | Status: DC
  Administered 2024-04-19 – 2024-04-20 (×2): 20 mg via ORAL
  Filled 2024-04-18 (×2): qty 1

## 2024-04-18 MED ORDER — SODIUM CHLORIDE 0.9 % IV SOLN
1.0000 g | Freq: Once | INTRAVENOUS | Status: AC
  Administered 2024-04-18: 1 g via INTRAVENOUS
  Filled 2024-04-18: qty 10

## 2024-04-18 MED ORDER — LACTATED RINGERS IV BOLUS
1000.0000 mL | Freq: Once | INTRAVENOUS | Status: AC
  Administered 2024-04-18: 1000 mL via INTRAVENOUS

## 2024-04-18 MED ORDER — AMITRIPTYLINE HCL 25 MG PO TABS
25.0000 mg | ORAL_TABLET | Freq: Every morning | ORAL | Status: DC
  Administered 2024-04-19 – 2024-04-20 (×2): 25 mg via ORAL
  Filled 2024-04-18 (×2): qty 1

## 2024-04-18 MED ORDER — HYDROCODONE-ACETAMINOPHEN 5-325 MG PO TABS
1.0000 | ORAL_TABLET | Freq: Once | ORAL | Status: AC
  Administered 2024-04-18: 1 via ORAL
  Filled 2024-04-18: qty 1

## 2024-04-18 MED ORDER — FLUOXETINE HCL 20 MG PO CAPS
20.0000 mg | ORAL_CAPSULE | Freq: Every day | ORAL | Status: DC
  Administered 2024-04-19 – 2024-04-20 (×2): 20 mg via ORAL
  Filled 2024-04-18 (×2): qty 1

## 2024-04-18 MED ORDER — INSULIN ASPART 100 UNIT/ML IJ SOLN
0.0000 [IU] | Freq: Three times a day (TID) | INTRAMUSCULAR | Status: DC
  Administered 2024-04-18: 15 [IU] via SUBCUTANEOUS
  Administered 2024-04-19 (×2): 11 [IU] via SUBCUTANEOUS
  Administered 2024-04-19: 15 [IU] via SUBCUTANEOUS
  Administered 2024-04-20: 11 [IU] via SUBCUTANEOUS
  Administered 2024-04-20: 15 [IU] via SUBCUTANEOUS
  Filled 2024-04-18 (×6): qty 1

## 2024-04-18 MED ORDER — ACETAMINOPHEN 325 MG PO TABS: 650.0000 mg | ORAL_TABLET | Freq: Four times a day (QID) | ORAL | Status: DC | PRN

## 2024-04-18 MED ORDER — LISINOPRIL 5 MG PO TABS
5.0000 mg | ORAL_TABLET | Freq: Every day | ORAL | Status: DC
  Administered 2024-04-19 – 2024-04-20 (×2): 5 mg via ORAL
  Filled 2024-04-18 (×2): qty 1

## 2024-04-18 MED ORDER — ONDANSETRON HCL 4 MG PO TABS: 4.0000 mg | ORAL_TABLET | Freq: Four times a day (QID) | ORAL | Status: DC | PRN

## 2024-04-18 MED ORDER — SODIUM CHLORIDE 0.9 % IV SOLN: INTRAVENOUS | Status: DC

## 2024-04-18 MED ORDER — NYSTATIN 100000 UNIT/GM EX POWD
Freq: Two times a day (BID) | CUTANEOUS | Status: DC
  Administered 2024-04-19: 1 via TOPICAL
  Filled 2024-04-18: qty 15

## 2024-04-18 MED ORDER — FENTANYL CITRATE (PF) 100 MCG/2ML IJ SOLN: 12.5000 ug | INTRAMUSCULAR | Status: DC | PRN

## 2024-04-18 MED ORDER — METFORMIN HCL ER 500 MG PO TB24
1000.0000 mg | ORAL_TABLET | Freq: Once | ORAL | Status: AC
  Administered 2024-04-18: 1000 mg via ORAL
  Filled 2024-04-18 (×2): qty 2

## 2024-04-18 MED ORDER — INSULIN ASPART 100 UNIT/ML IJ SOLN
5.0000 [IU] | Freq: Once | INTRAMUSCULAR | Status: AC
  Administered 2024-04-18: 5 [IU] via INTRAVENOUS
  Filled 2024-04-18: qty 1

## 2024-04-18 MED ORDER — VITAMIN D 25 MCG (1000 UNIT) PO TABS
1000.0000 [IU] | ORAL_TABLET | Freq: Every day | ORAL | Status: DC
  Administered 2024-04-19 – 2024-04-20 (×2): 1000 [IU] via ORAL
  Filled 2024-04-18 (×2): qty 1

## 2024-04-18 MED ORDER — BISACODYL 5 MG PO TBEC: 5.0000 mg | DELAYED_RELEASE_TABLET | Freq: Every day | ORAL | Status: DC | PRN

## 2024-04-18 MED ORDER — ONDANSETRON HCL 4 MG/2ML IJ SOLN: 4.0000 mg | Freq: Four times a day (QID) | INTRAMUSCULAR | Status: DC | PRN

## 2024-04-18 MED ORDER — ACETAMINOPHEN 650 MG RE SUPP: 650.0000 mg | Freq: Four times a day (QID) | RECTAL | Status: DC | PRN

## 2024-04-18 MED ORDER — ATORVASTATIN CALCIUM 20 MG PO TABS
20.0000 mg | ORAL_TABLET | Freq: Every day | ORAL | Status: DC
  Administered 2024-04-19 – 2024-04-20 (×2): 20 mg via ORAL
  Filled 2024-04-18 (×2): qty 1

## 2024-04-18 MED ORDER — METFORMIN HCL ER 750 MG PO TB24
750.0000 mg | ORAL_TABLET | Freq: Two times a day (BID) | ORAL | Status: DC
  Administered 2024-04-19: 750 mg via ORAL
  Filled 2024-04-18 (×3): qty 1

## 2024-04-18 MED ORDER — METFORMIN HCL ER 750 MG PO TB24
750.0000 mg | ORAL_TABLET | Freq: Two times a day (BID) | ORAL | Status: DC
  Filled 2024-04-18 (×2): qty 1

## 2024-04-18 MED ORDER — TRAZODONE HCL 50 MG PO TABS: 25.0000 mg | ORAL_TABLET | Freq: Every evening | ORAL | Status: DC | PRN

## 2024-04-18 MED ORDER — HYDROCODONE-ACETAMINOPHEN 5-325 MG PO TABS
1.0000 | ORAL_TABLET | ORAL | Status: DC | PRN
  Administered 2024-04-18 – 2024-04-19 (×2): 1 via ORAL
  Filled 2024-04-18 (×2): qty 1

## 2024-04-18 MED ORDER — ADULT MULTIVITAMIN W/MINERALS CH
1.0000 | ORAL_TABLET | Freq: Every day | ORAL | Status: DC
  Administered 2024-04-19 – 2024-04-20 (×2): 1 via ORAL
  Filled 2024-04-18 (×2): qty 1

## 2024-04-18 MED ORDER — FENTANYL CITRATE (PF) 50 MCG/ML IJ SOSY: 12.5000 ug | PREFILLED_SYRINGE | INTRAMUSCULAR | Status: DC | PRN

## 2024-04-18 NOTE — Telephone Encounter (Signed)
 Pt is currently in the ED.

## 2024-04-18 NOTE — Hospital Course (Signed)
 74 year old male with poorly controlled type 2 diabetes mellitus with hyperglycemia, hypertension, alliance with taking medications and following diet, depression, GERD, hyperlipidemia, chronic cerebral microvascular disease who presented to the ED complaining of increasing episodes of confusion, visual hallucinations and malaise.  His blood sugars have been elevated.  He is not in DKA however blood sugar was greater than 400.  He basically lives seems to be unable to care for himself.  He is not taking his medications as prescribed.  He is not following a proper diabetic diet.  He says that he has been recommended for SNF in the past but declined.  He now feels ready for SNF rehabilitation and has been admitted for treatment of uncontrolled diabetes mellitus with hyperglycemia and acute delirium concerning for onset of vascular dementia.

## 2024-04-18 NOTE — ED Triage Notes (Addendum)
 Pt in by [facility] from home for hyperglycemia. Pts cbg was 450. Pt states he has felt weak and off for a few days. Pt went to [facility] yesterday but left. Also states yesterday he was seeing odd things but can't describe them. Denies seeing or hearing anything today.

## 2024-04-18 NOTE — Inpatient Diabetes Management (Addendum)
 Inpatient Diabetes Program Recommendations  AACE/ADA: New Consensus Statement on Inpatient Glycemic Control   Target Ranges:  Prepandial:   less than 140 mg/dL      Peak postprandial:   less than 180 mg/dL (1-2 hours)      Critically ill patients:  140 - 180 mg/dL    Latest Reference Range & Units 04/18/24 09:38 04/18/24 11:34  Glucose-Capillary 70 - 99 mg/dL 573 (H) 669 (H)    Latest Reference Range & Units 04/18/24 09:50  CO2 22 - 32 mmol/L 23  Glucose 70 - 99 mg/dL 615 (H)  Anion gap 5 - 15  14    Review of Glycemic Control  Diabetes history: DM2 Outpatient Diabetes medications: Glipizide  XL 5 mg QAM, Metformin  XR 500 mg BID, Toujeo  34 units at bedtime (not taking) Current orders for Inpatient glycemic control: None; in ED   NOTE: Patient in ED via EMS with hyperglycemia, weakness, confusion, and visual hallucinations. Lab glucose 384 mg/dl at 0:49 am today. In reviewing chart, noted patient was seen by MICAEL Rio, NP (Endocrinology) on 04/10/24 and per note patient was recently hospitalized for DKA. His most recent A1c on 11/17 was 9.7%. He notes his Metformin  was recently increased to 1000 mg po twice daily after hospitalization and Lantus  was changed to 18 units nightly. He admits he often forgets to take his insulin . He has trouble with the sequencing his medications and taking them properly (long pattern of this). He did have follow up with his PCP recently who recommended SNF placement given decline in his health. So far, he has declined this.  I am truly concerned that he is unable to understand his medication regimen.  Thus, for safety purposes, will try to keep things as simple as possible.  He is advised to lower Metformin  to 500 mg ER po twice daily with meals (cannot increase due to worsening kidney function), and restart Glipizide  5 mg XL daily at breakfast (was previously on this but taken off during once of his hospitalizations).  He is currently using pill packs, and I did  have my nurse call his pharmacy to go over list of meds he should be taking for his diabetes. I am keeping him OFF insulin  for now as he has not been taking it consistently, nor do I think he can administer safely on his own.  I am having him return in 1 month for re-evaluation.   Noted patient was inpatient at [facility] 04/07/24-04/08/24 came in due to knee pain and noted to have hyperglycemia (not DKA).   Addendum 04/18/24@14 :30-Spoke with patient at bedside in ED. Patient states that he has been taking Metformin  500 mg BID and Glipizide  5 mg daily for DM. Patient has bubble pill packs at bedside and I was able to confirm he has both Metformin  and Glipizide  in packs as noted he is taking.  Patient states that he has not been checking glucose at home because he is missing parts of the supplies he needed to do CBGs. Patient states he went to [facility] pharmacy on 04/17/24 to get the rest of the testing supplies he needed and he had them check his glucose there at the pharmacy and it was over 500 mg/dl so he went to [facility] ED. He reports thet got his sugar down some and discharged him home.  He reports he has been urinating frequently, having excessive thirst, dry mouth, and very fatigued and weak for 4-5 days. He states he called EMS today to bring him to  the hospital because he feels so bad.  Asked patient if he has taken insulin  in the past and he states he has never taken insulin . Patient reports that he does not have any insulin  at home.  Discussed that if he is taking the oral DM medications and glucose is still staying significantly elevated, he may need to start taking insulin  outpatient.  Patient states he lives alone.  Patient states that he thinks he is going to be going to rehab for a short while but not sure yet.  Informed patient that our team will follow along and will plan to follow up with him if he is going to need to take insulin  outpatient. Patient verbalized understanding and has no questions at this time.    Thanks, Earnie Gainer, RN, MSN, CDCES Diabetes Coordinator Inpatient Diabetes Program [phone] (Team Pager from 8am to 5pm)

## 2024-04-18 NOTE — H&P (Signed)
 " History and Physical  [facility]  Taye Cato FMW:[id number] DOB: 1950-04-08 DOA: 04/18/2024  PCP: Leonce Lucie PARAS, PA-C  Patient coming from: Home  Level of care: Med-Surg  I have personally briefly reviewed patient's old medical records in [facility]  Chief Complaint: elevated blood sugars  HPI: Joe [name] is a 74 year old male with poorly controlled type 2 diabetes mellitus with hyperglycemia, hypertension, alliance with taking medications and following diet, depression, GERD, hyperlipidemia, chronic cerebral microvascular disease who presented to the ED complaining of increasing episodes of confusion, visual hallucinations and malaise.  His blood sugars have been elevated.  He is not in DKA however blood sugar was greater than 400.  He basically lives seems to be unable to care for himself.  He is not taking his medications as prescribed.  He is not following a proper diabetic diet.  He says that he has been recommended for SNF in the past but declined.  He now feels ready for SNF rehabilitation and has been admitted for treatment of uncontrolled diabetes mellitus with hyperglycemia and acute delirium concerning for onset of vascular dementia.    Past Medical History:  Diagnosis Date   Anxiety    Arthritis    osteoarthritis   Borderline diabetes    Chronic pain    Depression    DJD (degenerative joint disease)    GERD (gastroesophageal reflux disease)    H/O renal calculi 1992   Hypertension    Idiopathic Parkinson's disease (HCC)    Muscle weakness    Obesity    Psychosis (HCC)    Sleep apnea    STOP BANG; 7;Has study and was positive but have not gotten CPAP yet.    Past Surgical History:  Procedure Laterality Date   APPENDECTOMY  82    mmh   CATARACT EXTRACTION W/PHACO  12/09/2010   Procedure: CATARACT EXTRACTION PHACO AND INTRAOCULAR LENS PLACEMENT (IOC);  Surgeon: Cherene Mania;  Location: [facility] OR[facility];  Service: Ophthalmology;  Laterality: Right;  CDE: 36.21    CATARACT EXTRACTION W/PHACO  10/24/2011   Procedure: CATARACT EXTRACTION PHACO AND INTRAOCULAR LENS PLACEMENT (IOC);  Surgeon: Cherene Mania, MD;  Location: [facility] OR[facility];  Service: Ophthalmology;  Laterality: Left;  CDE 10.35   COLONOSCOPY  09/20/2011   Procedure: COLONOSCOPY;  Surgeon: Oneil DELENA Budge, MD;  Location: [facility] END[facility] SUITE;  Service: Gastroenterology;  Laterality: N/A;   CYSTOSCOPY     with kidney stone removal-mmh   HERNIA REPAIR     umbilical hernia repair-mmh   INCISIONAL HERNIA REPAIR N/A 09/16/2015   Procedure: HERNIA REPAIR INCISIONAL WITH MESH;  Surgeon: Oneil Budge, MD;  Location: [facility] OR[facility];  Service: General;  Laterality: N/A;  Umbilicus   INSERTION OF MESH N/A 09/16/2015   Procedure: INSERTION OF MESH UMBILICUS;  Surgeon: Oneil Budge, MD;  Location: [facility] OR[facility];  Service: General;  Laterality: N/A;     reports that he has never smoked. He has never used smokeless tobacco. He reports that he does not drink alcohol and does not use drugs.  Allergies[1]  Family History  Problem Relation Age of Onset   Heart disease Other    Cancer Other    Hypertension Other    Anesthesia problems Neg Hx    Hypotension Neg Hx    Malignant hyperthermia Neg Hx    Pseudochol deficiency Neg Hx     Prior to Admission medications  Medication Sig Start Date End Date Taking? Authorizing Provider  Accu-Chek Softclix Lancets lancets Use as instructed  to monitor glucose twice daily 03/13/24   Therisa Benton PARAS, NP  amitriptyline  (ELAVIL ) 25 MG tablet Take 25 mg by mouth at bedtime. 01/01/20   [provider]  atorvastatin  (LIPITOR) 20 MG tablet Take 1 tablet (20 mg total) by mouth daily. 02/19/24   Melvenia Motto, MD  blood glucose meter kit and supplies Dispense based on patient and insurance preference. Use to check blood glucose two times daily as directed. (FOR ICD-10 E10.9, E11.9). Patient not taking: Reported on 04/10/2024 12/30/21   Therisa Benton PARAS, NP  Blood Glucose Monitoring Suppl (ACCU-CHEK  GUIDE ME) w/Device KIT Use to check glucose twice daily 08/04/23   Therisa Benton PARAS, NP  Blood Glucose Monitoring Suppl DEVI 1 each by Does not apply route in the morning, at noon, and at bedtime. Use to check glucose twice daily 10/10/22   Therisa Benton PARAS, NP  calcium -vitamin D  (OSCAL WITH D) 500-200 MG-UNIT tablet Take 1 tablet by mouth 2 (two) times daily. Patient not taking: Reported on 04/10/2024    [provider]  cholecalciferol  (VITAMIN D ) 1000 units tablet Take 1,000 Units by mouth daily.    [provider]  famotidine  (PEPCID ) 20 MG tablet Take 1 tablet (20 mg total) by mouth daily. 02/19/24 05/19/24  Melvenia Motto, MD  FLUoxetine  (PROZAC ) 20 MG capsule Take 1 capsule (20 mg total) by mouth daily. 02/19/24 05/19/24  Melvenia Motto, MD  FORA Lancets MISC USE TO CHECK BLOOD GLUCOSE TWICE DAILY 10/17/22   Therisa Benton PARAS, NP  glipiZIDE  (GLUCOTROL  XL) 5 MG 24 hr tablet Take 1 tablet (5 mg total) by mouth daily with breakfast. 04/10/24   Therisa Benton PARAS, NP  glucose blood (ACCU-CHEK GUIDE TEST) test strip Use as instructed to monitor glucose twice daily. 03/13/24   Therisa Benton PARAS, NP  Glucose Blood (BLOOD GLUCOSE TEST STRIPS) STRP Use to check glucose twice daily 05/16/23   Therisa Benton PARAS, NP  Lancet Device MISC Use to check glucose twice daily 10/10/22   Therisa Benton PARAS, NP  lisinopril  (ZESTRIL ) 5 MG tablet Take 1 tablet (5 mg total) by mouth daily. 02/19/24   Melvenia Motto, MD  metFORMIN  (GLUCOPHAGE -XR) 500 MG 24 hr tablet Take 1 tablet (500 mg total) by mouth 2 (two) times daily. 04/10/24   Therisa Benton PARAS, NP  Multiple Vitamins-Minerals (MULTIVITAMIN WITH MINERALS) tablet Take 1 tablet by mouth daily.    [provider]  promethazine (PHENERGAN) 25 MG tablet Take 25 mg by mouth every 6 (six) hours as needed. Patient not taking: Reported on 04/10/2024 09/29/22   [provider]  TOUJEO  SOLOSTAR 300 UNIT/ML Solostar Pen Inject 34 Units into the skin at  bedtime. Patient not taking: Reported on 04/10/2024 10/05/22   [provider]    Physical Exam: Vitals:   04/18/24 0918 04/18/24 0929 04/18/24 0930 04/18/24 1130  BP: (!) 95/50  127/70 124/89  Pulse: 88  94 87  Resp: 18  14 18   Temp: 98.5 F (36.9 C)     TempSrc: Oral     SpO2: 97%  95% 97%  Weight:  104 kg    Height:  5' 7 (1.702 m)      Constitutional: frail, elderly male, NAD, calm, comfortable Eyes: PERRL, lids and conjunctivae normal ENMT: Mucous membranes are pale and dry. Posterior pharynx clear of any exudate or lesions. Poor dentition.  Neck: normal, supple, no masses, no thyromegaly Respiratory: clear to auscultation bilaterally, no wheezing, no crackles. Normal respiratory effort. No accessory muscle  use.  Cardiovascular: normal s1, s2 sounds, no murmurs / rubs / gallops. No extremity edema. 2+ pedal pulses. No carotid bruits.  Abdomen: no tenderness, no masses palpated. No hepatosplenomegaly. Bowel sounds positive.  Musculoskeletal: no clubbing / cyanosis. No joint deformity upper and lower extremities. Good ROM, no contractures. Normal muscle tone.  Skin: no rashes, lesions, ulcers. No induration Neurologic: CN 2-12 grossly intact. Sensation intact, DTR normal. Strength 5/5 in all 4.  Psychiatric: diminished judgment and insight. Alert and oriented x 2 but having some hallucinations at times.  Normal mood.   Labs on Admission: I have personally reviewed following labs and imaging studies  CBC: Recent Labs  Lab 04/18/24 0950  WBC 7.7  NEUTROABS 4.9  HGB 13.2  HCT 39.2  MCV 91.6  PLT 208   Basic Metabolic Panel: Recent Labs  Lab 04/18/24 0950  NA 133*  K 4.9  CL 95*  CO2 23  GLUCOSE 384*  BUN 20  CREATININE 0.95  CALCIUM  9.3   GFR: Estimated Creatinine Clearance: 78.4 mL/min (by C-G formula based on SCr of 0.95 mg/dL). Liver Function Tests: Recent Labs  Lab 04/18/24 0950  AST 17  ALT 25  ALKPHOS 50  BILITOT 0.6  PROT 6.7  ALBUMIN  4.0   No results for input(s): LIPASE, AMYLASE in the last 168 hours. No results for input(s): AMMONIA in the last 168 hours. Coagulation Profile: No results for input(s): INR, PROTIME in the last 168 hours. Cardiac Enzymes: No results for input(s): CKTOTAL, CKMB, CKMBINDEX, TROPONINI in the last 168 hours. BNP (last 3 results) No results for input(s): PROBNP in the last 8760 hours. HbA1C: No results for input(s): HGBA1C in the last 72 hours. CBG: Recent Labs  Lab 04/18/24 0938 04/18/24 1134 04/18/24 1336  GLUCAP 426* 330* 287*   Lipid Profile: No results for input(s): CHOL, HDL, LDLCALC, TRIG, CHOLHDL, LDLDIRECT in the last 72 hours. Thyroid  Function Tests: No results for input(s): TSH, T4TOTAL, FREET4, T3FREE, THYROIDAB in the last 72 hours. Anemia Panel: No results for input(s): VITAMINB12, FOLATE, FERRITIN, TIBC, IRON, RETICCTPCT in the last 72 hours. Urine analysis:    Component Value Date/Time   COLORURINE YELLOW 04/18/2024 0929   APPEARANCEUR HAZY (A) 04/18/2024 0929   LABSPEC 1.025 04/18/2024 0929   PHURINE 6.0 04/18/2024 0929   GLUCOSEU >=500 (A) 04/18/2024 0929   HGBUR NEGATIVE 04/18/2024 0929   BILIRUBINUR NEGATIVE 04/18/2024 0929   KETONESUR 20 (A) 04/18/2024 0929   PROTEINUR NEGATIVE 04/18/2024 0929   NITRITE NEGATIVE 04/18/2024 0929   LEUKOCYTESUR SMALL (A) 04/18/2024 0929    Radiological Exams on Admission: CT Head Wo Contrast Result Date: 04/18/2024 EXAM: CT HEAD WITHOUT CONTRAST 04/18/2024 10:22:48 AM TECHNIQUE: CT of the head was performed without the administration of intravenous contrast. Automated exposure control, iterative reconstruction, and/or weight based adjustment of the mA/kV was utilized to reduce the radiation dose to as low as reasonably achievable. COMPARISON: Comparison is 02/06/2016. CLINICAL HISTORY: Hallucinations, AMS. FINDINGS: BRAIN AND VENTRICLES: Age-related cerebral volume  loss. Mild periventricular white matter disease. Mild calcific atheromatous disease within carotid siphons. No acute hemorrhage. No evidence of acute infarct. No hydrocephalus. No extra-axial collection. No mass effect or midline shift. ORBITS: Bilateral lens replacement. No acute abnormality. SINUSES: No acute abnormality. SOFT TISSUES AND SKULL: No acute soft tissue abnormality. No skull fracture. IMPRESSION: 1. No acute intracranial abnormality. 2. Age-related cerebral volume loss and mild periventricular white matter disease. Electronically signed by: Lonni Necessary MD 04/18/2024 10:35 AM EST RP Workstation: [id number]  DG Knee Complete 4 Views Right Result Date: 04/18/2024 CLINICAL DATA:  Right knee pain. EXAM: RIGHT KNEE - COMPLETE 4+ VIEW COMPARISON:  10/25/2016 FINDINGS: Minimal tricompartmental osteoarthritic change. No acute fracture or dislocation. No significant joint effusion. IMPRESSION: 1. No acute findings. 2. Minimal osteoarthritis. Electronically Signed   By: Toribio Agreste M.D.   On: 04/18/2024 10:25   DG Chest Port 1 View Result Date: 04/18/2024 CLINICAL DATA:  Weakness a few days. EXAM: PORTABLE CHEST 1 VIEW COMPARISON:  None Available. FINDINGS: Lungs are adequately inflated and otherwise clear. Cardiomediastinal silhouette is normal. Mild degenerative change of the spine. IMPRESSION: No active disease. Electronically Signed   By: Toribio Agreste M.D.   On: 04/18/2024 10:17    EKG: Independently reviewed.    Assessment/Plan Principal Problem:   Acute delirium Active Problems:   Acute confusional state   Uncontrolled type 2 diabetes mellitus with hypoglycemia without coma (HCC)   Essential hypertension, benign   Vitamin D  deficiency   Mixed hyperlipidemia   Noncompliance with diet and medication regimen     Acute delirium  -- likely a component of metabolic encephalopathy -- I worry about underlying vascular dementia  -- he had a recent MRI brain at [facility] 11/25 with  findings of chronic microvascular disease -- CT scan is reassuring of no acute CVA -- neuro exam is stable no acute deficits found -- check a dementia lab panel  -- continue neuro checks for 24 hours -- treating hyperglycemia and dehydration  -- delirium precautions ordered   Depression of chronic illness -- resume home medications -- denies SI/HI  -- follow clinically, if needed, obtain TTS evaluation   Uncontrolled type 2 DM with hyperglycemia -- follow up A1c testing -- he had been taken off insulin  by outpatient provider due to poor compliance -- his renal function today is good, increase metformin  ER to 750 mg BID with goal of 1000 mg BID -- SSI coverage ordered -- frequent CBG monitoring -- carb modified diet  -- IV fluids overnight   Essential hypertension  -- resume home meds when reconciled  Generalized Weakness Adult failure to thrive  -- obtain PT/OT evaluation  -- TOC consultation for SNF placement  Hyperlipidemia  -- resume home statin therapy   Noncompliance  -- likely related to vascular dementia -- pt agreeable to evaluation for SNF rehab    DVT prophylaxis: enoxparin   Code Status: Full   Family Communication:   Disposition Plan: anticipating SNF   Consults called: PT/OT   Admission status: INP Time spent: 70 mins   Level of care: Med-Surg Afton Louder MD [facility] How to contact the [facility] Attending or Consulting provider 7A - 7P or covering provider during after hours 7P -7A, for this patient?  Check the care team in [facility] and look for a) attending/consulting [facility] provider listed and b) the [facility] team listed Log into [email or web address] and use [place]'s universal password to access. If you do not have the password, please contact the hospital operator. Locate the [facility] provider you are looking for under [facility] and page to a number that you can be directly reached. If you still have difficulty reaching the provider, please page the  [facility] (Director on Call) for the Hospitalists listed on amion for assistance.   If 7PM-7AM, please contact night-coverage [email or web address] Password [email or web address]  04/18/2024, 2:02 PM        [1] No Known Allergies  "

## 2024-04-18 NOTE — Plan of Care (Signed)

## 2024-04-18 NOTE — ED Provider Notes (Signed)
 " [place] EMERGENCY DEPARTMENT AT [facility] Provider Note   CSN: [id number] Arrival date & time: 04/18/24  9097     Patient presents with: Hyperglycemia   Joe [name] is a 74 y.o. male.   Patient is a 74 year old male who presents emergency department the chief complaint of elevated blood sugar, generalized malaise and fatigue, confusion, visual hallucinations which have been ongoing for the past few days.  Patient notes that he has been taking his medications at home for his blood sugar.  Patient states that he is not currently using insulin  at this point but per his previous notes he is post to be on insulin .  Patient denies any associated chest pain or shortness of breath.  He denies any abdominal pain, nausea, vomiting, diarrhea.  He admits to ongoing pain to his right knee which is chronic in nature.   Hyperglycemia Associated symptoms: confusion and fatigue        Prior to Admission medications  Medication Sig Start Date End Date Taking? Authorizing Provider  Accu-Chek Softclix Lancets lancets Use as instructed to monitor glucose twice daily 03/13/24   Therisa Benton PARAS, NP  amitriptyline  (ELAVIL ) 25 MG tablet Take 25 mg by mouth at bedtime. 01/01/20   [provider]  atorvastatin  (LIPITOR) 20 MG tablet Take 1 tablet (20 mg total) by mouth daily. 02/19/24   Melvenia Motto, MD  blood glucose meter kit and supplies Dispense based on patient and insurance preference. Use to check blood glucose two times daily as directed. (FOR ICD-10 E10.9, E11.9). Patient not taking: Reported on 04/10/2024 12/30/21   Therisa Benton PARAS, NP  Blood Glucose Monitoring Suppl (ACCU-CHEK GUIDE ME) w/Device KIT Use to check glucose twice daily 08/04/23   Therisa Benton PARAS, NP  Blood Glucose Monitoring Suppl DEVI 1 each by Does not apply route in the morning, at noon, and at bedtime. Use to check glucose twice daily 10/10/22   Therisa Benton PARAS, NP  calcium -vitamin D  (OSCAL WITH D) 500-200  MG-UNIT tablet Take 1 tablet by mouth 2 (two) times daily. Patient not taking: Reported on 04/10/2024    [provider]  cholecalciferol  (VITAMIN D ) 1000 units tablet Take 1,000 Units by mouth daily.    [provider]  famotidine  (PEPCID ) 20 MG tablet Take 1 tablet (20 mg total) by mouth daily. 02/19/24 05/19/24  Melvenia Motto, MD  FLUoxetine  (PROZAC ) 20 MG capsule Take 1 capsule (20 mg total) by mouth daily. 02/19/24 05/19/24  Melvenia Motto, MD  FORA Lancets MISC USE TO CHECK BLOOD GLUCOSE TWICE DAILY 10/17/22   Therisa Benton PARAS, NP  glipiZIDE  (GLUCOTROL  XL) 5 MG 24 hr tablet Take 1 tablet (5 mg total) by mouth daily with breakfast. 04/10/24   Therisa Benton PARAS, NP  glucose blood (ACCU-CHEK GUIDE TEST) test strip Use as instructed to monitor glucose twice daily. 03/13/24   Therisa Benton PARAS, NP  Glucose Blood (BLOOD GLUCOSE TEST STRIPS) STRP Use to check glucose twice daily 05/16/23   Therisa Benton PARAS, NP  Lancet Device MISC Use to check glucose twice daily 10/10/22   Therisa Benton PARAS, NP  lisinopril  (ZESTRIL ) 5 MG tablet Take 1 tablet (5 mg total) by mouth daily. 02/19/24   Melvenia Motto, MD  metFORMIN  (GLUCOPHAGE -XR) 500 MG 24 hr tablet Take 1 tablet (500 mg total) by mouth 2 (two) times daily. 04/10/24   Therisa Benton PARAS, NP  Multiple Vitamins-Minerals (MULTIVITAMIN WITH MINERALS) tablet Take 1 tablet by mouth daily.    [provider]  promethazine (PHENERGAN) 25 MG tablet Take 25 mg by mouth every 6 (six) hours as needed. Patient not taking: Reported on 04/10/2024 09/29/22   [provider]  TOUJEO  SOLOSTAR 300 UNIT/ML Solostar Pen Inject 34 Units into the skin at bedtime. Patient not taking: Reported on 04/10/2024 10/05/22   [provider]    Allergies: Patient has no known allergies.    Review of Systems  Constitutional:  Positive for fatigue.  Psychiatric/Behavioral:  Positive for confusion and hallucinations.   All other systems reviewed and are  negative.   Updated Vital Signs BP 127/70   Pulse 94   Temp 98.5 F (36.9 C) (Oral)   Resp 14   Ht 5' 7 (1.702 m)   Wt 104 kg   SpO2 95%   BMI 35.91 kg/m   Physical Exam Vitals and nursing note reviewed.  Constitutional:      General: He is not in acute distress.    Appearance: Normal appearance. He is not ill-appearing.  HENT:     Head: Normocephalic and atraumatic.     Nose: Nose normal.     Mouth/Throat:     Mouth: Mucous membranes are moist.  Eyes:     Extraocular Movements: Extraocular movements intact.     Conjunctiva/sclera: Conjunctivae normal.     Pupils: Pupils are equal, round, and reactive to light.  Cardiovascular:     Rate and Rhythm: Normal rate and regular rhythm.     Pulses: Normal pulses.     Heart sounds: Normal heart sounds. No murmur heard.    No gallop.  Pulmonary:     Effort: Pulmonary effort is normal. No respiratory distress.     Breath sounds: Normal breath sounds. No stridor. No wheezing, rhonchi or rales.  Abdominal:     General: Abdomen is flat. Bowel sounds are normal. There is no distension.     Palpations: Abdomen is soft.     Tenderness: There is no abdominal tenderness. There is no guarding.  Musculoskeletal:        General: No swelling, tenderness, deformity or signs of injury. Normal range of motion.     Cervical back: Normal range of motion and neck supple. No rigidity or tenderness.  Skin:    General: Skin is warm and dry.  Neurological:     General: No focal deficit present.     Mental Status: He is alert and oriented to person, place, and time. Mental status is at baseline.     Cranial Nerves: No cranial nerve deficit.     Sensory: No sensory deficit.     Motor: No weakness.     Coordination: Coordination normal.     Gait: Gait normal.  Psychiatric:        Mood and Affect: Mood normal.        Behavior: Behavior normal.        Thought Content: Thought content normal.        Judgment: Judgment normal.     (all labs  ordered are listed, but only abnormal results are displayed) Labs Reviewed  CBG MONITORING, ED - Abnormal; Notable for the following components:      Result Value   Glucose-Capillary 426 (*)    All other components within normal limits  COMPREHENSIVE METABOLIC PANEL WITH GFR  CBC WITH DIFFERENTIAL/PLATELET  URINALYSIS, ROUTINE W REFLEX MICROSCOPIC  BLOOD GAS, VENOUS  BETA-HYDROXYBUTYRIC ACID    EKG: None  Radiology: No results found.   Procedures   Medications Ordered  in the ED  nystatin  (MYCOSTATIN /NYSTOP ) topical powder (has no administration in time range)  HYDROcodone -acetaminophen  (NORCO/VICODIN) 5-325 MG per tablet 1 tablet (has no administration in time range)  lactated ringers  bolus 1,000 mL (1,000 mLs Intravenous New Bag/Given 04/18/24 0942)                                    Medical Decision Making Patient is doing well at this time and does remain stable.  Urinalysis is questionable for urinary tract infection.  He is hyperglycemic in the emergency department and was given IV fluids as well as IV insulin .  I did touch base with his sister who does agree that the changes in his mentation are new as well as the hallucinations.  They are also concerned about his living situation and the fact that he is not caring for himself at home and may need placement.  Patient did have otherwise unremarkable blood work at this point.  He is not currently in DKA or HHS.  Abdominal exam is benign with no focal tenderness throughout.  CT scan of the head was unremarkable.  He has no clinical indication for meningitis or encephalitis.  Did discuss patient case with Dr. Vicci with the hospitalist service who has excepted for admission.  Amount and/or Complexity of Data Reviewed Labs: ordered. Radiology: ordered.  Risk Prescription drug management. Decision regarding hospitalization.        Final diagnoses:  None    ED Discharge Orders     None          Daralene Lonni JONETTA DEVONNA 04/18/24 1401    Freddi Hamilton, MD 04/18/24 1542  "

## 2024-04-19 ENCOUNTER — Telehealth (HOSPITAL_COMMUNITY): Payer: Self-pay | Admitting: Pharmacy Technician

## 2024-04-19 ENCOUNTER — Other Ambulatory Visit (HOSPITAL_COMMUNITY): Payer: Self-pay

## 2024-04-19 DIAGNOSIS — N390 Urinary tract infection, site not specified: Secondary | ICD-10-CM | POA: Diagnosis present

## 2024-04-19 DIAGNOSIS — I10 Essential (primary) hypertension: Secondary | ICD-10-CM | POA: Diagnosis not present

## 2024-04-19 DIAGNOSIS — F05 Delirium due to known physiological condition: Secondary | ICD-10-CM

## 2024-04-19 DIAGNOSIS — Z91199 Patient's noncompliance with other medical treatment and regimen due to unspecified reason: Secondary | ICD-10-CM | POA: Diagnosis not present

## 2024-04-19 DIAGNOSIS — E559 Vitamin D deficiency, unspecified: Secondary | ICD-10-CM | POA: Diagnosis not present

## 2024-04-19 DIAGNOSIS — Z91148 Patient's other noncompliance with medication regimen for other reason: Secondary | ICD-10-CM | POA: Diagnosis not present

## 2024-04-19 DIAGNOSIS — E782 Mixed hyperlipidemia: Secondary | ICD-10-CM | POA: Diagnosis not present

## 2024-04-19 DIAGNOSIS — E11649 Type 2 diabetes mellitus with hypoglycemia without coma: Secondary | ICD-10-CM | POA: Diagnosis not present

## 2024-04-19 LAB — BASIC METABOLIC PANEL WITH GFR
Anion gap: 10 (ref 5–15)
BUN: 17 mg/dL (ref 8–23)
CO2: 27 mmol/L (ref 22–32)
Calcium: 8.9 mg/dL (ref 8.9–10.3)
Chloride: 100 mmol/L (ref 98–111)
Creatinine, Ser: 1.01 mg/dL (ref 0.61–1.24)
GFR, Estimated: >60 mL/min
Glucose, Bld: 246 mg/dL — ABNORMAL HIGH (ref 70–99)
Potassium: 5.1 mmol/L (ref 3.5–5.1)
Sodium: 137 mmol/L (ref 135–145)

## 2024-04-19 LAB — MAGNESIUM: Magnesium: 2 mg/dL (ref 1.7–2.4)

## 2024-04-19 LAB — SYPHILIS: RPR W/REFLEX TO RPR TITER AND TREPONEMAL ANTIBODIES, TRADITIONAL SCREENING AND DIAGNOSIS ALGORITHM: RPR Ser Ql: NONREACTIVE

## 2024-04-19 LAB — GLUCOSE, CAPILLARY
Glucose-Capillary: 227 mg/dL — ABNORMAL HIGH (ref 70–99)
Glucose-Capillary: 301 mg/dL — ABNORMAL HIGH (ref 70–99)
Glucose-Capillary: 296 mg/dL — ABNORMAL HIGH (ref 70–99)
Glucose-Capillary: 251 mg/dL — ABNORMAL HIGH (ref 70–99)
Glucose-Capillary: 102 mg/dL — ABNORMAL HIGH (ref 70–99)

## 2024-04-19 LAB — VITAMIN D 25 HYDROXY (VIT D DEFICIENCY, FRACTURES): Vit D, 25-Hydroxy: 26.4 ng/mL — ABNORMAL LOW (ref 30–100)

## 2024-04-19 MED ORDER — SODIUM CHLORIDE 0.9 % IV SOLN: INTRAVENOUS | Status: AC

## 2024-04-19 MED ORDER — INSULIN ASPART PROT & ASPART (70-30 MIX) 100 UNIT/ML ~~LOC~~ SUSP
20.0000 [IU] | Freq: Two times a day (BID) | SUBCUTANEOUS | Status: DC
  Administered 2024-04-20: 20 [IU] via SUBCUTANEOUS
  Filled 2024-04-19: qty 10

## 2024-04-19 MED ORDER — SODIUM CHLORIDE 0.9 % IV SOLN
2.0000 g | INTRAVENOUS | Status: DC
  Administered 2024-04-19: 2 g via INTRAVENOUS
  Filled 2024-04-19: qty 20

## 2024-04-19 MED ORDER — INSULIN GLARGINE 100 UNIT/ML ~~LOC~~ SOLN
18.0000 [IU] | Freq: Every day | SUBCUTANEOUS | Status: DC
  Administered 2024-04-19: 18 [IU] via SUBCUTANEOUS
  Filled 2024-04-19 (×2): qty 0.18

## 2024-04-19 MED ORDER — FLUCONAZOLE 150 MG PO TABS
150.0000 mg | ORAL_TABLET | Freq: Once | ORAL | Status: AC
  Administered 2024-04-19: 150 mg via ORAL
  Filled 2024-04-19: qty 1

## 2024-04-19 MED ORDER — METFORMIN HCL ER 500 MG PO TB24
1000.0000 mg | ORAL_TABLET | Freq: Two times a day (BID) | ORAL | Status: DC
  Administered 2024-04-19 – 2024-04-20 (×2): 1000 mg via ORAL
  Filled 2024-04-19 (×2): qty 2

## 2024-04-19 MED ORDER — INSULIN ASPART 100 UNIT/ML IJ SOLN
6.0000 [IU] | Freq: Three times a day (TID) | INTRAMUSCULAR | Status: AC
  Administered 2024-04-19 (×3): 6 [IU] via SUBCUTANEOUS
  Filled 2024-04-19 (×3): qty 1

## 2024-04-19 NOTE — Progress Notes (Addendum)
 " PROGRESS NOTE   Joe [name]  FMW:[id number] DOB: 03-Jan-1951 DOA: 04/18/2024 PCP: Leonce Lucie PARAS, PA-C   Chief Complaint  Patient presents with   Hyperglycemia   Level of care: Med-Surg  Brief Admission History:  74 year old male with poorly controlled type 2 diabetes mellitus with hyperglycemia, hypertension, alliance with taking medications and following diet, depression, GERD, hyperlipidemia, chronic cerebral microvascular disease who presented to the ED complaining of increasing episodes of confusion, visual hallucinations and malaise.  His blood sugars have been elevated.  He is not in DKA however blood sugar was greater than 400.  He basically lives seems to be unable to care for himself.  He is not taking his medications as prescribed.  He is not following a proper diabetic diet.  He says that he has been recommended for SNF in the past but declined.  He now feels ready for SNF rehabilitation and has been admitted for treatment of uncontrolled diabetes mellitus with hyperglycemia and acute delirium concerning for onset of vascular dementia.   Assessment and Plan:  Acute delirium -- Improved  -- likely metabolic encephalopathy from severely uncontrolled DM -- I worry about underlying vascular dementia  -- he had a recent MRI brain at [facility] 11/25 with findings of chronic microvascular disease -- CT scan is reassuring of no acute CVA -- neuro exam is stable no acute deficits found -- check a dementia lab panel -- labs have been reassuring; no reversible causes found -- completing neuro checks for 24 hours -- treating hyperglycemia and dehydration  -- delirium precautions ordered    Gram negative rod UTI -- urine culture growing >100k gram neg rods -- continue ceftriaxone  2 gm daily  -- follow up C&S testing   Depression of chronic illness -- resumed home medications -- continues to denies SI/HI  -- follow clinically, if needed, obtain TTS evaluation    Uncontrolled type 2  DM with hyperglycemia -- as evidenced by A1c 16.1% and glucotoxicity  -- he had been taken off insulin  by outpatient provider due to poor compliance -- his renal function is good, increase metformin  ER to 750 mg BID with goal of 1000 mg BID -- SSI coverage ordered -- adding glargine 18 units daily, novolog  6 units TID with meals, plus SSI resistant; with need to titrate doses for better control -- frequent CBG monitoring -- carb modified diet  -- IV fluids   CBG (last 3)  Recent Labs    04/19/24 0317 04/19/24 0719 04/19/24 1107  GLUCAP 227* 301* 296*   Vitamin D  deficiency -- check 25-OH vitamin D   Essential hypertension  -- resume home meds when reconciled   Generalized Weakness Adult failure to thrive  -- obtain PT/OT evaluation  -- TOC consultation for SNF placement   Hyperlipidemia  -- resume home statin therapy    Noncompliance  -- likely related to underlying vascular dementia -- pt agreeable to evaluation for SNF rehab     DVT prophylaxis: enoxaparin  Code Status: Full  Family Communication:  Disposition: anticipate need for SNF   Consultants:  DM coordinator Procedures:   Antimicrobials:  Ceftriaxone  04/18/24   Subjective: Pt says he feels tired and weak and he is urinating frequently.   Objective: Vitals:   04/18/24 1532 04/18/24 2041 04/19/24 0023 04/19/24 0314  BP: (!) 117/99 110/76 131/73 (!) 125/59  Pulse: 93 89 83 85  Resp: 18 18 20 19   Temp: 98.5 F (36.9 C) 98 F (36.7 C) 98.5 F (36.9 C) 97.8 F (36.6 C)  TempSrc: Oral Oral Oral Oral  SpO2: 98% 94% 95% 95%  Weight:      Height:        Intake/Output Summary (Last 24 hours) at 04/19/2024 1217 Last data filed at 04/19/2024 9371 Gross per 24 hour  Intake 640 ml  Output 400 ml  Net 240 ml   Filed Weights   04/18/24 0929 04/18/24 1530  Weight: 104 kg 102.4 kg   Examination:  General exam: Appears calm and comfortable  Respiratory system: Clear to auscultation. Respiratory effort  normal. Cardiovascular system: normal S1 & S2 heard. No JVD, murmurs, rubs, gallops or clicks. No pedal edema. Gastrointestinal system: Abdomen is nondistended, soft and nontender. No organomegaly or masses felt. Normal bowel sounds heard. Central nervous system: Alert and oriented. No focal neurological deficits. Extremities: Symmetric 5 x 5 power. Skin: No rashes, lesions or ulcers. Psychiatry: Judgement and insight appear normal. Mood & affect appropriate.   Data Reviewed: I have personally reviewed following labs and imaging studies  CBC: Recent Labs  Lab 04/18/24 0950  WBC 7.7  NEUTROABS 4.9  HGB 13.2  HCT 39.2  MCV 91.6  PLT 208    Basic Metabolic Panel: Recent Labs  Lab 04/18/24 0950 04/19/24 0503  NA 133* 137  K 4.9 5.1  CL 95* 100  CO2 23 27  GLUCOSE 384* 246*  BUN 20 17  CREATININE 0.95 1.01  CALCIUM  9.3 8.9  MG  --  2.0    CBG: Recent Labs  Lab 04/18/24 1641 04/18/24 1945 04/19/24 0317 04/19/24 0719 04/19/24 1107  GLUCAP 347* 372* 227* 301* 296*    No results found for this or any previous visit (from the past 240 hours).   Radiology Studies: CT Head Wo Contrast Result Date: 04/18/2024 EXAM: CT HEAD WITHOUT CONTRAST 04/18/2024 10:22:48 AM TECHNIQUE: CT of the head was performed without the administration of intravenous contrast. Automated exposure control, iterative reconstruction, and/or weight based adjustment of the mA/kV was utilized to reduce the radiation dose to as low as reasonably achievable. COMPARISON: Comparison is 02/06/2016. CLINICAL HISTORY: Hallucinations, AMS. FINDINGS: BRAIN AND VENTRICLES: Age-related cerebral volume loss. Mild periventricular white matter disease. Mild calcific atheromatous disease within carotid siphons. No acute hemorrhage. No evidence of acute infarct. No hydrocephalus. No extra-axial collection. No mass effect or midline shift. ORBITS: Bilateral lens replacement. No acute abnormality. SINUSES: No acute  abnormality. SOFT TISSUES AND SKULL: No acute soft tissue abnormality. No skull fracture. IMPRESSION: 1. No acute intracranial abnormality. 2. Age-related cerebral volume loss and mild periventricular white matter disease. Electronically signed by: Lonni Necessary MD 04/18/2024 10:35 AM EST RP Workstation: [id number]   DG Knee Complete 4 Views Right Result Date: 04/18/2024 CLINICAL DATA:  Right knee pain. EXAM: RIGHT KNEE - COMPLETE 4+ VIEW COMPARISON:  10/25/2016 FINDINGS: Minimal tricompartmental osteoarthritic change. No acute fracture or dislocation. No significant joint effusion. IMPRESSION: 1. No acute findings. 2. Minimal osteoarthritis. Electronically Signed   By: Toribio Agreste M.D.   On: 04/18/2024 10:25   DG Chest Port 1 View Result Date: 04/18/2024 CLINICAL DATA:  Weakness a few days. EXAM: PORTABLE CHEST 1 VIEW COMPARISON:  None Available. FINDINGS: Lungs are adequately inflated and otherwise clear. Cardiomediastinal silhouette is normal. Mild degenerative change of the spine. IMPRESSION: No active disease. Electronically Signed   By: Toribio Agreste M.D.   On: 04/18/2024 10:17    Scheduled Meds:  amitriptyline   25 mg Oral q AM   atorvastatin   20 mg Oral Daily   cholecalciferol   1,000  Units Oral Daily   enoxaparin  (LOVENOX ) injection  40 mg Subcutaneous Q24H   famotidine   20 mg Oral Daily   FLUoxetine   20 mg Oral Daily   insulin  aspart  0-20 Units Subcutaneous TID WC   insulin  aspart  6 Units Subcutaneous TID WC   insulin  glargine  18 Units Subcutaneous Daily   lisinopril   5 mg Oral Daily   metFORMIN   750 mg Oral BID WC   multivitamin with minerals  1 tablet Oral Daily   nystatin    Topical BID   Continuous Infusions:  sodium chloride  40 mL/hr at 04/18/24 1543     LOS: 1 day   Time spent: 55 mins  Kimyetta Flott Vicci, MD How to contact the [facility] Attending or Consulting provider 7A - 7P or covering provider during after hours 7P -7A, for this patient?  Check the care team in  [facility] and look for a) attending/consulting TRH provider listed and b) the TRH team listed Log into [email or web address] to find provider on call.  Locate the TRH provider you are looking for under [facility] and page to a number that you can be directly reached. If you still have difficulty reaching the provider, please page the [facility] (Director on Call) for the Hospitalists listed on amion for assistance.  04/19/2024, 12:17 PM    "

## 2024-04-19 NOTE — TOC Initial Note (Signed)
 Transition of Care [facility]) - Initial/Assessment Note    Patient Details  Name: Joe [name] MRN: [id number] Date of Birth: [birth year]  [facility]) CM/SW Contact:    Sharlyne Stabs, RN Phone Number: 04/19/2024, 1:38 PM  Clinical Narrative:  Patient admitted with acute delirium. PT has not follow up recommendations. CM spoke with patient he lives with his neighbor and her provides transportation as needed. CM offered HHRN for continuing diabetic education. Patient declines at this time. States he has used the medication before. No other needs identified.                  Expected Discharge Plan: Home/Self Care Barriers to Discharge: Continued Medical Work up   Patient Goals and CMS Choice Patient states their goals for this hospitalization and ongoing recovery are:: return home CMS Medicare.gov Compare Post Acute Care list provided to:: Patient Choice offered to / list presented to : Patient [place] ownership interest in [facility].provided to:: Patient    Expected Discharge Plan and Services       Living arrangements for the past 2 months: Single Family Home                   Prior Living Arrangements/Services Living arrangements for the past 2 months: Single Family Home Lives with:: Friends Patient language and need for interpreter reviewed:: Yes        Need for Family Participation in Patient Care: Yes (Comment)     Criminal Activity/Legal Involvement Pertinent to Current Situation/Hospitalization: No - Comment as needed  Activities of Daily Living   ADL Screening (condition at time of admission) Independently performs ADLs?: Yes (appropriate for developmental age) Is the patient deaf or have difficulty hearing?: No Does the patient have difficulty seeing, even when wearing glasses/contacts?: No Does the patient have difficulty concentrating, remembering, or making decisions?: No  Permission Sought/Granted                  Emotional  Assessment   Attitude/Demeanor/Rapport: Engaged Affect (typically observed): Accepting Orientation: : Oriented to Self, Oriented to Place, Oriented to  Time, Oriented to Situation Alcohol / Substance Use: Not Applicable Psych Involvement: No (comment)  Admission diagnosis:  Hallucinations [R44.3] Acute delirium [R41.0] Hyperglycemia [R73.9] Altered mental status, unspecified altered mental status type [R41.82] Patient Active Problem List   Diagnosis Date Noted   UTI (urinary tract infection) 04/19/2024   Acute delirium 04/18/2024   Acute confusional state 04/18/2024   Noncompliance with diet and medication regimen 04/18/2024   Uncontrolled type 2 diabetes mellitus with hypoglycemia without coma (HCC) 01/30/2020   Essential hypertension, benign 01/30/2020   Vitamin D  deficiency 01/30/2020   Mixed hyperlipidemia 01/30/2020   PCP:  Leonce Lucie PARAS, PA-C Pharmacy:   [facility] [place],  - [address] [place] [address] Phone: [phone] Fax: [phone]     Social Drivers of Health (SDOH) Social History: SDOH Screenings   Food Insecurity: No Food Insecurity (04/18/2024)  Housing: Low Risk (04/18/2024)  Transportation Needs: No Transportation Needs (04/18/2024)  Utilities: Not At Risk (04/18/2024)  Financial Resource Strain: Low Risk (02/28/2024)   Received from [facility]  Physical Activity: Sufficiently Active (02/28/2024)   Received from [facility]  Social Connections: Moderately Integrated (04/18/2024)  Stress: No Stress Concern Present (02/28/2024)   Received from [facility]  Tobacco Use: Low Risk (04/18/2024)  Health Literacy: Low Risk (02/28/2024)  Received from [facility]   SDOH Interventions:     Readmission Risk Interventions    04/19/2024    1:36 PM  Readmission Risk Prevention Plan  Post Dischage Appt Not Complete  Medication Screening Complete  Transportation Screening Complete

## 2024-04-19 NOTE — Telephone Encounter (Signed)
 Patient Product/process Development Scientist completed.    The patient is insured through [employer]. Patient has Medicare and is not eligible for a copay card, but may be able to apply for patient assistance or Medicare RX Payment Plan (Patient Must reach out to their plan, if eligible for payment plan), if available.    Ran test claim for Lantus  Pen and the current 30 day co-pay is $12.65.   This test claim was processed through [facility]- copay amounts may vary at other pharmacies due to pharmacy/plan contracts, or as the patient moves through the different stages of their insurance plan.     Reyes Sharps, CPHT Pharmacy Technician Patient Advocate Specialist Lead [facility] Patient Advocate Team Direct Number: [phone]  Fax: [phone]

## 2024-04-19 NOTE — Evaluation (Signed)
 Physical Therapy Evaluation Patient Details Name: Joe [name] MRN: [id number] DOB: 1950/05/07 Today's Date: 04/19/2024  History of Present Illness  Joe [name] is a 74 year old male with poorly controlled type 2 diabetes mellitus with hyperglycemia, hypertension, alliance with taking medications and following diet, depression, GERD, hyperlipidemia, chronic cerebral microvascular disease who presented to the ED complaining of increasing episodes of confusion, visual hallucinations and malaise.  His blood sugars have been elevated.  He is not in DKA however blood sugar was greater than 400.  He basically lives seems to be unable to care for himself.  He is not taking his medications as prescribed.  He is not following a proper diabetic diet.  He says that he has been recommended for SNF in the past but declined.  He now feels ready for SNF rehabilitation and has been admitted for treatment of uncontrolled diabetes mellitus with hyperglycemia and acute delirium concerning for onset of vascular dementia.   Clinical Impression  Patient functioning at baseline for functional mobility and gait demonstrating good return for ambulating in room, hallways using SPC without loss of balance. Patient encouraged to ambulate in room and hallway ad lib for LOS. Plan:  Patient discharged from physical therapy to care of nursing for ambulation daily as tolerated for length of stay.          If plan is discharge home, recommend the following: Assistance with cooking/housework;Assist for transportation;Help with stairs or ramp for entrance   Can travel by private vehicle        Equipment Recommendations None recommended by PT  Recommendations for Other Services       Functional Status Assessment Patient has not had a recent decline in their functional status     Precautions / Restrictions Precautions Precautions: None Recall of Precautions/Restrictions: Intact Restrictions Weight Bearing Restrictions Per  Provider Order: No      Mobility  Bed Mobility Overal bed mobility: Modified Independent                  Transfers Overall transfer level: Modified independent                      Ambulation/Gait Ambulation/Gait assistance: Modified independent (Device/Increase time) Gait Distance (Feet): 150 Feet Assistive device: Straight cane Gait Pattern/deviations: Step-through pattern, WFL(Within Functional Limits) Gait velocity: near normal     General Gait Details: grossly WFL with good return for ambulating in room, hallways using cane without loss of balance  Stairs            Wheelchair Mobility     Tilt Bed    Modified Rankin (Stroke Patients Only)       Balance Overall balance assessment: Needs assistance Sitting-balance support: Feet supported, No upper extremity supported Sitting balance-Leahy Scale: Good Sitting balance - Comments: seated at EOB   Standing balance support: During functional activity, Single extremity supported Standing balance-Leahy Scale: Good Standing balance comment: using SPC                             Pertinent Vitals/Pain Pain Assessment Pain Assessment: Faces Faces Pain Scale: Hurts a little bit Pain Location: right calf are due to cramping which is baseline Pain Descriptors / Indicators: Cramping Pain Intervention(s): Monitored during session    Home Living Family/patient expects to be discharged to:: Private residence Living Arrangements: Non-relatives/Friends Available Help at Discharge: Friend(s);Available 24 hours/day Type of Home: House Home Access: Stairs to enter Entrance  Stairs-Rails: None Entrance Stairs-Number of Steps: 2   Home Layout: One level Home Equipment: Cane - single point;BSC/3in1;Grab bars - tub/shower      Prior Function Prior Level of Function : Independent/Modified Independent             Mobility Comments: household and community ambulaton using SPC PRN, does  not drive ADLs Comments: Independent, his friend can assist if needed 24/7     Extremity/Trunk Assessment   Upper Extremity Assessment Upper Extremity Assessment: Overall WFL for tasks assessed    Lower Extremity Assessment Lower Extremity Assessment: Overall WFL for tasks assessed    Cervical / Trunk Assessment Cervical / Trunk Assessment: Normal  Communication   Communication Communication: No apparent difficulties    Cognition Arousal: Alert Behavior During Therapy: WFL for tasks assessed/performed                             Following commands: Intact       Cueing Cueing Techniques: Verbal cues     General Comments      Exercises     Assessment/Plan    PT Assessment Patient does not need any further PT services  PT Problem List         PT Treatment Interventions      PT Goals (Current goals can be found in the Care Plan section)  Acute Rehab PT Goals Patient Stated Goal: return home with friend to assist PT Goal Formulation: With patient Time For Goal Achievement: 04/19/24 Potential to Achieve Goals: Good    Frequency       Co-evaluation               AM-PAC PT 6 Clicks Mobility  Outcome Measure Help needed turning from your back to your side while in a flat bed without using bedrails?: None Help needed moving from lying on your back to sitting on the side of a flat bed without using bedrails?: None Help needed moving to and from a bed to a chair (including a wheelchair)?: None Help needed standing up from a chair using your arms (e.g., wheelchair or bedside chair)?: None Help needed to walk in hospital room?: None Help needed climbing 3-5 steps with a railing? : A Little 6 Click Score: 23    End of Session   Activity Tolerance: Patient tolerated treatment well Patient left: in bed;with call bell/phone within reach Nurse Communication: Mobility status PT Visit Diagnosis: Unsteadiness on feet (R26.81);Other abnormalities  of gait and mobility (R26.89);Muscle weakness (generalized) (M62.81)    Time: 8879-8854 PT Time Calculation (min) (ACUTE ONLY): 25 min   Charges:   PT Evaluation $PT Eval Moderate Complexity: 1 Mod PT Treatments $Therapeutic Activity: 23-37 mins PT General Charges $$ ACUTE PT VISIT: 1 Visit         3:00 PM, 04/19/24 Lynwood Music, MPT Physical Therapist with [facility] [phone] office [phone] mobile phone

## 2024-04-19 NOTE — Plan of Care (Signed)

## 2024-04-19 NOTE — Plan of Care (Signed)
" °  Problem: Education: Goal: Knowledge of General Education information will improve Description: Including pain rating scale, medication(s)/side effects and non-pharmacologic comfort measures Outcome: Progressing   Problem: Clinical Measurements: Goal: Ability to maintain clinical measurements within normal limits will improve Outcome: Progressing   Problem: Coping: Goal: Level of anxiety will decrease Outcome: Progressing   Problem: Pain Managment: Goal: General experience of comfort will improve and/or be controlled Outcome: Progressing   Problem: Safety: Goal: Ability to remain free from injury will improve Outcome: Progressing   Problem: Education: Goal: Ability to describe self-care measures that may prevent or decrease complications (Diabetes Survival Skills Education) will improve Outcome: Progressing Goal: Individualized Educational Video(s) Outcome: Progressing   "

## 2024-04-19 NOTE — Inpatient Diabetes Management (Signed)
 Inpatient Diabetes Program Recommendations  AACE/ADA: New Consensus Statement on Inpatient Glycemic Control   Target Ranges:  Prepandial:   less than 140 mg/dL      Peak postprandial:   less than 180 mg/dL (1-2 hours)      Critically ill patients:  140 - 180 mg/dL     Latest Reference Range & Units 04/18/24 09:38 04/18/24 11:34 04/18/24 13:36 04/18/24 16:41 04/18/24 19:45 04/19/24 03:17 04/19/24 07:19  Glucose-Capillary 70 - 99 mg/dL 573 (H) 669 (H) 712 (H) 347 (H) 372 (H) 227 (H) 301 (H)    Latest Reference Range & Units 02/19/24 11:09 04/18/24 14:55  Hemoglobin A1C 4.8 - 5.6 % 9.7 (H) 16.4 (H)   Review of Glycemic Control  Diabetes history: DM2 Outpatient Diabetes medications: Glipizide  XL 5 mg QAM, Metformin  XR 500 mg BID, Toujeo  34 units at bedtime (not taking) Current orders for Inpatient glycemic control: Lantus  18 units daily, Novolog  6 units TID with meals, Novolog  0-20 units TID with meals, Metformin  750 mg BID  Inpatient Diabetes Program Recommendations:    HbgA1C: A1C 16.4% on 04/18/24 indicating an average glucose of 424 mg/dl over the past 2-3 months.  Outpatient DM: If patient is discharged home, he will need Rx for lancets (#11001), insulin  pens (Lantus , [id number]), and insulin  pen needles (#[id number]). Would recommend to use simple insulin  regimen with once a day injection if possible and would recommend home health nurse to help reinforce insulin  education and ensure patient is taking it correctly. Patient's insurance covers Lantus  and copay is $12.65.  Addendum 04/19/24@12 :45-Spoke with patient at bedside regarding DM control. Patient confirms that he was taking Glipizide  and Metformin  for DM control. He had stated yesterday that he had never taken insulin  but today he states he did take insulin  using insulin  pens in the past but it has been a long time since he took insulin .  Patient reports that he gave himself insulin  in his stomach mainly. Patient reports that he thinks he  took Toujeo  insulin . Discussed insulin  injection sites and importance of rotating injection sites. Discussed insulin  storage and needle disposal. Patient states he will need lancets in order to check his glucose at home (he reports he has everything but the lancets).  Discussed glucose trends and A1C of 16.4% indicating an average glucose of 424 mg/dl. Discussed glucose and A1C goals. Explained that he will likely need to take insulin  again outpatient in order to get DM under control. Patient states he will take insulin  again if needed and he will try to do a better job at taking it consistently. Provided an insulin  pen, pen needle, and alcohol swab and asked patient to demonstrate how to use the insulin  pen. Patient was not able to demonstrate how to properly use an insulin  pen. Verbally explained each step of using an insulin  pen and demonstrated to patient. Had patient demonstrate using the insulin  pen x3 and he needed verbal cues each time in order to do each step.  Patient states that he lives with a friend and his friend may be able to help him if needed. Discussed importance of getting DM  under better control and taking insulin  consistently.  Noted MD notes, SNF placement for rehab.  If patient ends up not going to SNF and being discharged home, would recommend home health nurse to come out daily in the beginning to ensure patient is taking insulin  properly and to help reinforce education. If patient is discharged home, would also recommend to use simple insulin  regimen if  possible.  Sent chat message to [place], RN with [facility] and Dr. Vicci with recommendations if discharged home. He will need further education on insulin  pens and would benefit from home health coming out to assist with insulin  injections and help reinforce education.    Thanks, Earnie Gainer, RN, MSN, CDCES Diabetes Coordinator Inpatient Diabetes Program [phone] (Team Pager from 8am to 5pm)

## 2024-04-20 DIAGNOSIS — E559 Vitamin D deficiency, unspecified: Secondary | ICD-10-CM

## 2024-04-20 DIAGNOSIS — I10 Essential (primary) hypertension: Secondary | ICD-10-CM | POA: Diagnosis not present

## 2024-04-20 DIAGNOSIS — Z91199 Patient's noncompliance with other medical treatment and regimen due to unspecified reason: Secondary | ICD-10-CM | POA: Diagnosis not present

## 2024-04-20 DIAGNOSIS — F05 Delirium due to known physiological condition: Secondary | ICD-10-CM | POA: Diagnosis not present

## 2024-04-20 DIAGNOSIS — E782 Mixed hyperlipidemia: Secondary | ICD-10-CM | POA: Diagnosis not present

## 2024-04-20 LAB — BASIC METABOLIC PANEL WITH GFR
Anion gap: 11 (ref 5–15)
BUN: 16 mg/dL (ref 8–23)
CO2: 25 mmol/L (ref 22–32)
Calcium: 8.7 mg/dL — ABNORMAL LOW (ref 8.9–10.3)
Chloride: 101 mmol/L (ref 98–111)
Creatinine, Ser: 0.92 mg/dL (ref 0.61–1.24)
GFR, Estimated: >60 mL/min
Glucose, Bld: 254 mg/dL — ABNORMAL HIGH (ref 70–99)
Potassium: 4.4 mmol/L (ref 3.5–5.1)
Sodium: 137 mmol/L (ref 135–145)

## 2024-04-20 LAB — GLUCOSE, CAPILLARY
Glucose-Capillary: 259 mg/dL — ABNORMAL HIGH (ref 70–99)
Glucose-Capillary: 268 mg/dL — ABNORMAL HIGH (ref 70–99)
Glucose-Capillary: 332 mg/dL — ABNORMAL HIGH (ref 70–99)

## 2024-04-20 LAB — URINE CULTURE: Culture: >=100000 — AB

## 2024-04-20 LAB — MAGNESIUM: Magnesium: 2.3 mg/dL (ref 1.7–2.4)

## 2024-04-20 MED ORDER — CEFADROXIL 500 MG PO CAPS: 500.0000 mg | ORAL_CAPSULE | Freq: Two times a day (BID) | ORAL | 0 refills | Status: AC

## 2024-04-20 MED ORDER — INSULIN ASPART PROT & ASPART (70-30 MIX) 100 UNIT/ML PEN: 25.0000 [IU] | PEN_INJECTOR | Freq: Two times a day (BID) | SUBCUTANEOUS | 2 refills | Status: AC

## 2024-04-20 MED ORDER — METFORMIN HCL ER 500 MG PO TB24: 1000.0000 mg | ORAL_TABLET | Freq: Two times a day (BID) | ORAL | 2 refills | Status: AC

## 2024-04-20 MED ORDER — NYSTATIN 100000 UNIT/GM EX POWD: Freq: Two times a day (BID) | CUTANEOUS | 0 refills | Status: AC

## 2024-04-20 MED ORDER — LANCET DEVICE MISC: 1.0000 | Freq: Three times a day (TID) | 0 refills | Status: AC

## 2024-04-20 MED ORDER — BLOOD GLUCOSE MONITORING SUPPL DEVI: 1.0000 | Freq: Three times a day (TID) | 0 refills | Status: AC

## 2024-04-20 MED ORDER — LANCETS MISC: 1.0000 | 0 refills | Status: AC

## 2024-04-20 MED ORDER — VITAMIN D (ERGOCALCIFEROL) 1.25 MG (50000 UNIT) PO CAPS
50000.0000 [IU] | ORAL_CAPSULE | ORAL | Status: DC
  Administered 2024-04-20: 50000 [IU] via ORAL
  Filled 2024-04-20: qty 1

## 2024-04-20 MED ORDER — "PEN NEEDLES 5/16"" 31G X 8 MM MISC": 1.0000 | 3 refills | Status: AC | PRN

## 2024-04-20 MED ORDER — BLOOD GLUCOSE TEST VI STRP: 1.0000 | ORAL_STRIP | Freq: Three times a day (TID) | 0 refills | Status: AC

## 2024-04-20 NOTE — Discharge Summary (Signed)
 Physician Discharge Summary  Joe [name] FMW:[id number] DOB: March 30, 1951 DOA: 04/18/2024  PCP: Leonce Lucie PARAS, PA-C Endocrinology: [facility] Admit date: 04/18/2024 Discharge date: 04/20/2024  Admitted From:  Home  Disposition: Home with [facility]   Recommendations for Outpatient Follow-up:  Follow up with PCP in 1 weeks Follow up with endocrinology in 2 weeks  Home Health:  RN, SW   Discharge Condition: STABLE   CODE STATUS: FULL DIET: carb modified    Brief Hospitalization Summary: Please see all hospital notes, images, labs for full details of the hospitalization. 74 year old male with poorly controlled type 2 diabetes mellitus with hyperglycemia, hypertension, alliance with taking medications and following diet, depression, GERD, hyperlipidemia, chronic cerebral microvascular disease who presented to the ED complaining of increasing episodes of confusion, visual hallucinations and malaise.  His blood sugars have been elevated.  He is not in DKA however blood sugar was greater than 400.  He basically lives seems to be unable to care for himself.  He is not taking his medications as prescribed.  He is not following a proper diabetic diet.  He says that he has been recommended for SNF in the past but declined.  He now feels ready for SNF rehabilitation and has been admitted for treatment of uncontrolled diabetes mellitus with hyperglycemia and acute delirium concerning for onset of vascular dementia.  Hospital Course by listed problems addressed  Acute delirium -- RESOLVED Acute metabolic encephalopathy -- RESOLVED   -- likely metabolic encephalopathy from severely uncontrolled DM and UTI -- I worry about underlying vascular dementia  -- he had a recent MRI brain at [facility] 11/25 with findings of chronic microvascular disease -- CT scan is reassuring of no acute CVA -- neuro exam is stable no acute deficits found -- checked a dementia lab panel -- labs have been reassuring; no  reversible causes found -- completing neuro checks for 24 hours -- treating hyperglycemia and dehydration  -- delirium precautions ordered    E coli UTI -- urine culture growing >100k gram neg rods  -- continue ceftriaxone  2 gm daily  -- follow up C&S testing-- pansensitive -- DC on duricef 500 mg BID x 4 more days to complete course    Depression of chronic illness -- resumed home medications -- continues to denies SI/HI  -- follow clinically, if needed, obtain TTS evaluation    Uncontrolled type 2 DM with hyperglycemia -- as evidenced by A1c 16.1% and glucotoxicity  -- he had been taken off insulin  by outpatient provider due to poor compliance -- his renal function is good, increase metformin  ER to 750 mg BID with goal of 1000 mg BID -- SSI coverage ordered -- for simplification of home insulin  use, we are sending home on novolog  flexpen 70/30 to take 20 units BID with breakfast and supper, we ordered new blood glucose meter and testing supplies, hypoglycemia precautions, social worker consulted to assist with home health for RN and social work. -- frequent CBG monitoring -- carb modified diet  -- IV fluids completed  -- follow up with [facility]  -- ambulatory referral for diabetes education and nutrition services  CBG (last 3)  Recent Labs    04/19/24 2128 04/20/24 0325 04/20/24 0725  GLUCAP 102* 259* 268*   Vitamin D  deficiency -- Drisdol  50k international units x 1 dose  -- resume daily vitamin D    Essential hypertension  -- resume home meds    Generalized Weakness Adult failure to thrive  -- obtain PT/OT evaluation  -- TOC  consultation for SNF placement   Hyperlipidemia  -- resume home statin therapy    Noncompliance  -- likely related to underlying vascular dementia -- pt agreeable to evaluation for SNF rehab however PT eval with no needs identified, TOC unable to arrange SNF for him, he agreed to home health  Discharge Diagnoses:   Principal Problem:   Acute delirium Active Problems:   Acute confusional state   E Coli UTI (urinary tract infection)   Uncontrolled type 2 diabetes mellitus with hypoglycemia without coma (HCC)   Essential hypertension, benign   Vitamin D  deficiency   Mixed hyperlipidemia   Noncompliance with diet and medication regimen   Discharge Instructions: Discharge Instructions     Referral to Nutrition and Diabetes Services   Complete by: As directed    Choose type of Diabetes Self-Management Training (DSMT) training services and number of hours requested: Initial DSMT: 10 hours   Check all special needs that apply to patient requiring 1 on 1 DSMT: Low literacy   DSMT Content: Comprehensive self-management skills- All of the content areas   Choose the type of Medical Nutrition Therapy (MNT) and number of hours: Initial MNT: 3 hours   FOR MEDICARE PATIENTS: I hereby certify that I am managing this beneficiary's diabetes condition and that the above prescribed training is a necessary part of management.: Yes      Allergies as of 04/20/2024   No Known Allergies      Medication List     STOP taking these medications    blood glucose meter kit and supplies   calcium -vitamin D  500-200 MG-UNIT tablet Commonly known as: OSCAL WITH D   glipiZIDE  5 MG 24 hr tablet Commonly known as: GLUCOTROL  XL   Toujeo  SoloStar 300 UNIT/ML Solostar Pen Generic drug: insulin  glargine (1 Unit Dial)       TAKE these medications    Accu-Chek Softclix Lancets lancets Use as instructed to monitor glucose twice daily What changed: Another medication with the same name was changed. Make sure you understand how and when to take each.   Lancets Misc 1 each by Does not apply route as directed. Dispense based on patient and insurance preference. Use up to four times daily as directed. (FOR ICD-10 E10.9, E11.9). What changed:  how much to take how to take this when to take this additional  instructions   amitriptyline  25 MG tablet Commonly known as: ELAVIL  Take 25 mg by mouth in the morning.   atorvastatin  20 MG tablet Commonly known as: LIPITOR Take 1 tablet (20 mg total) by mouth daily.   Blood Glucose Monitoring Suppl Devi 1 each by Does not apply route in the morning, at noon, and at bedtime. May substitute to any manufacturer covered by patient's insurance. What changed:  additional instructions Another medication with the same name was removed. Continue taking this medication, and follow the directions you see here.   BLOOD GLUCOSE TEST STRIPS Strp 1 each by In Vitro route in the morning, at noon, and at bedtime. May substitute to any manufacturer covered by patient's insurance. What changed:  See the new instructions. Another medication with the same name was removed. Continue taking this medication, and follow the directions you see here.   cefadroxil  500 MG capsule Commonly known as: DURICEF Take 1 capsule (500 mg total) by mouth 2 (two) times daily for 4 days.   cholecalciferol  1000 units tablet Commonly known as: VITAMIN D  Take 1,000 Units by mouth daily.   famotidine  20 MG tablet  Commonly known as: PEPCID  Take 1 tablet (20 mg total) by mouth daily.   FLUoxetine  20 MG capsule Commonly known as: PROZAC  Take 1 capsule (20 mg total) by mouth daily.   insulin  aspart protamine - aspart (70-30) 100 UNIT/ML FlexPen Commonly known as: NOVOLOG  70/30 MIX Inject 25 Units into the skin 2 (two) times daily with a meal. Take with breakfast and supper   Lancet Device Misc 1 each by Does not apply route in the morning, at noon, and at bedtime. May substitute to any manufacturer covered by patient's insurance. What changed:  how much to take how to take this when to take this additional instructions   lisinopril  5 MG tablet Commonly known as: ZESTRIL  Take 1 tablet (5 mg total) by mouth daily.   metFORMIN  500 MG 24 hr tablet Commonly known as:  GLUCOPHAGE -XR Take 2 tablets (1,000 mg total) by mouth 2 (two) times daily with a meal. What changed:  how much to take when to take this   multivitamin with minerals tablet Take 1 tablet by mouth daily.   nystatin  powder Commonly known as: MYCOSTATIN /NYSTOP  Apply topically 2 (two) times daily.   ondansetron  4 MG disintegrating tablet Commonly known as: ZOFRAN -ODT Take 4 mg by mouth.   PEN NEEDLES 31GX5/16 31G X 8 MM Misc 1 Needle by Does not apply route as needed.        Follow-up Information     Leonce Lucie PARAS, PA-C. Schedule an appointment as soon as possible for a visit in 1 week(s).   Specialty: Family Medicine Why: Hospital Follow Up Contact information: [address] [place] [address] [phone]         Therisa Benton PARAS, NP. Schedule an appointment as soon as possible for a visit in 2 week(s).   Specialty: Nurse Practitioner Why: Hospital Follow Up Contact information: [address]  [place] [address] [phone]                Allergies[1] Allergies as of 04/20/2024   No Known Allergies      Medication List     STOP taking these medications    blood glucose meter kit and supplies   calcium -vitamin D  500-200 MG-UNIT tablet Commonly known as: OSCAL WITH D   glipiZIDE  5 MG 24 hr tablet Commonly known as: GLUCOTROL  XL   Toujeo  SoloStar 300 UNIT/ML Solostar Pen Generic drug: insulin  glargine (1 Unit Dial)       TAKE these medications    Accu-Chek Softclix Lancets lancets Use as instructed to monitor glucose twice daily What changed: Another medication with the same name was changed. Make sure you understand how and when to take each.   Lancets Misc 1 each by Does not apply route as directed. Dispense based on patient and insurance preference. Use up to four times daily as directed. (FOR ICD-10 E10.9, E11.9). What changed:  how much to take how to take this when to take this additional instructions    amitriptyline  25 MG tablet Commonly known as: ELAVIL  Take 25 mg by mouth in the morning.   atorvastatin  20 MG tablet Commonly known as: LIPITOR Take 1 tablet (20 mg total) by mouth daily.   Blood Glucose Monitoring Suppl Devi 1 each by Does not apply route in the morning, at noon, and at bedtime. May substitute to any manufacturer covered by patient's insurance. What changed:  additional instructions Another medication with the same name was removed. Continue taking this medication, and follow the  directions you see here.   BLOOD GLUCOSE TEST STRIPS Strp 1 each by In Vitro route in the morning, at noon, and at bedtime. May substitute to any manufacturer covered by patient's insurance. What changed:  See the new instructions. Another medication with the same name was removed. Continue taking this medication, and follow the directions you see here.   cefadroxil  500 MG capsule Commonly known as: DURICEF Take 1 capsule (500 mg total) by mouth 2 (two) times daily for 4 days.   cholecalciferol  1000 units tablet Commonly known as: VITAMIN D  Take 1,000 Units by mouth daily.   famotidine  20 MG tablet Commonly known as: PEPCID  Take 1 tablet (20 mg total) by mouth daily.   FLUoxetine  20 MG capsule Commonly known as: PROZAC  Take 1 capsule (20 mg total) by mouth daily.   insulin  aspart protamine - aspart (70-30) 100 UNIT/ML FlexPen Commonly known as: NOVOLOG  70/30 MIX Inject 25 Units into the skin 2 (two) times daily with a meal. Take with breakfast and supper   Lancet Device Misc 1 each by Does not apply route in the morning, at noon, and at bedtime. May substitute to any manufacturer covered by patient's insurance. What changed:  how much to take how to take this when to take this additional instructions   lisinopril  5 MG tablet Commonly known as: ZESTRIL  Take 1 tablet (5 mg total) by mouth daily.   metFORMIN  500 MG 24 hr tablet Commonly known as: GLUCOPHAGE -XR Take 2  tablets (1,000 mg total) by mouth 2 (two) times daily with a meal. What changed:  how much to take when to take this   multivitamin with minerals tablet Take 1 tablet by mouth daily.   nystatin  powder Commonly known as: MYCOSTATIN /NYSTOP  Apply topically 2 (two) times daily.   ondansetron  4 MG disintegrating tablet Commonly known as: ZOFRAN -ODT Take 4 mg by mouth.   PEN NEEDLES 31GX5/16 31G X 8 MM Misc 1 Needle by Does not apply route as needed.        Procedures/Studies: CT Head Wo Contrast Result Date: 04/18/2024 EXAM: CT HEAD WITHOUT CONTRAST 04/18/2024 10:22:48 AM TECHNIQUE: CT of the head was performed without the administration of intravenous contrast. Automated exposure control, iterative reconstruction, and/or weight based adjustment of the mA/kV was utilized to reduce the radiation dose to as low as reasonably achievable. COMPARISON: Comparison is 02/06/2016. CLINICAL HISTORY: Hallucinations, AMS. FINDINGS: BRAIN AND VENTRICLES: Age-related cerebral volume loss. Mild periventricular white matter disease. Mild calcific atheromatous disease within carotid siphons. No acute hemorrhage. No evidence of acute infarct. No hydrocephalus. No extra-axial collection. No mass effect or midline shift. ORBITS: Bilateral lens replacement. No acute abnormality. SINUSES: No acute abnormality. SOFT TISSUES AND SKULL: No acute soft tissue abnormality. No skull fracture. IMPRESSION: 1. No acute intracranial abnormality. 2. Age-related cerebral volume loss and mild periventricular white matter disease. Electronically signed by: Lonni Necessary MD 04/18/2024 10:35 AM EST RP Workstation: [id number]   DG Knee Complete 4 Views Right Result Date: 04/18/2024 CLINICAL DATA:  Right knee pain. EXAM: RIGHT KNEE - COMPLETE 4+ VIEW COMPARISON:  10/25/2016 FINDINGS: Minimal tricompartmental osteoarthritic change. No acute fracture or dislocation. No significant joint effusion. IMPRESSION: 1. No acute  findings. 2. Minimal osteoarthritis. Electronically Signed   By: Toribio Agreste M.D.   On: 04/18/2024 10:25   DG Chest Port 1 View Result Date: 04/18/2024 CLINICAL DATA:  Weakness a few days. EXAM: PORTABLE CHEST 1 VIEW COMPARISON:  None Available. FINDINGS: Lungs are adequately inflated and otherwise clear. Cardiomediastinal  silhouette is normal. Mild degenerative change of the spine. IMPRESSION: No active disease. Electronically Signed   By: Toribio Agreste M.D.   On: 04/18/2024 10:17     Subjective:   Discharge Exam: Vitals:   04/20/24 0326 04/20/24 0934  BP: 115/86 116/67  Pulse: 86   Resp: 18   Temp: 97.6 F (36.4 C)   SpO2: 95%    Vitals:   04/19/24 1444 04/19/24 1921 04/20/24 0326 04/20/24 0934  BP: (!) 111/56 (!) 115/52 115/86 116/67  Pulse: 91 91 86   Resp: 18 16 18    Temp: 98.4 F (36.9 C) 98.8 F (37.1 C) 97.6 F (36.4 C)   TempSrc: Oral Oral Oral   SpO2: 94% 93% 95%   Weight:      Height:        General: Pt is alert, awake, not in acute distress Cardiovascular: RRR, S1/S2 +, no rubs, no gallops Respiratory: CTA bilaterally, no wheezing, no rhonchi Abdominal: Soft, NT, ND, bowel sounds + Extremities: no edema, no cyanosis   The results of significant diagnostics from this hospitalization (including imaging, microbiology, ancillary and laboratory) are listed below for reference.     Microbiology: Recent Results (from the past 240 hours)  Urine Culture     Status: Abnormal   Collection Time: 04/18/24 11:38 AM   Specimen: Urine, Clean Catch  Result Value Ref Range Status   Specimen Description   Final    URINE, CLEAN CATCH Performed at [facility], [address] [place], [place] [address]    Special Requests   Final    NONE Performed at [facility], [address], [place], [place] [address]    Culture (A)  Final    >=100,000 COLONIES/mL ESCHERICHIA COLI AMONG MIXED ORGANISMS Performed at [facility], [address] [place], [place]  [address]    Report Status 04/20/2024 FINAL  Final   Organism ID, Bacteria ESCHERICHIA COLI (A)  Final      Susceptibility   Escherichia coli - MIC*    AMPICILLIN <=2 SENSITIVE Sensitive     CEFAZOLIN  (URINE) Value in next row Sensitive      <=1 SENSITIVEThis is a modified FDA-approved test that has been validated and its performance characteristics determined by the reporting laboratory.  This laboratory is certified under the Clinical Laboratory Improvement Amendments CLIA as qualified to perform high complexity clinical laboratory testing.    CEFEPIME Value in next row Sensitive      <=1 SENSITIVEThis is a modified FDA-approved test that has been validated and its performance characteristics determined by the reporting laboratory.  This laboratory is certified under the Clinical Laboratory Improvement Amendments CLIA as qualified to perform high complexity clinical laboratory testing.    ERTAPENEM Value in next row Sensitive      <=1 SENSITIVEThis is a modified FDA-approved test that has been validated and its performance characteristics determined by the reporting laboratory.  This laboratory is certified under the Clinical Laboratory Improvement Amendments CLIA as qualified to perform high complexity clinical laboratory testing.    CEFTRIAXONE  Value in next row Sensitive      <=1 SENSITIVEThis is a modified FDA-approved test that has been validated and its performance characteristics determined by the reporting laboratory.  This laboratory is certified under the Clinical Laboratory Improvement Amendments CLIA as qualified to perform high complexity clinical laboratory testing.    CIPROFLOXACIN Value in next row Sensitive      <=1 SENSITIVEThis is a modified FDA-approved test that has been  validated and its performance characteristics determined by the reporting laboratory.  This laboratory is certified under the Clinical Laboratory Improvement Amendments CLIA as qualified to perform high complexity  clinical laboratory testing.    GENTAMICIN Value in next row Sensitive      <=1 SENSITIVEThis is a modified FDA-approved test that has been validated and its performance characteristics determined by the reporting laboratory.  This laboratory is certified under the Clinical Laboratory Improvement Amendments CLIA as qualified to perform high complexity clinical laboratory testing.    NITROFURANTOIN Value in next row Sensitive      <=1 SENSITIVEThis is a modified FDA-approved test that has been validated and its performance characteristics determined by the reporting laboratory.  This laboratory is certified under the Clinical Laboratory Improvement Amendments CLIA as qualified to perform high complexity clinical laboratory testing.    TRIMETH/SULFA Value in next row Sensitive      <=1 SENSITIVEThis is a modified FDA-approved test that has been validated and its performance characteristics determined by the reporting laboratory.  This laboratory is certified under the Clinical Laboratory Improvement Amendments CLIA as qualified to perform high complexity clinical laboratory testing.    AMPICILLIN/SULBACTAM Value in next row Sensitive      <=1 SENSITIVEThis is a modified FDA-approved test that has been validated and its performance characteristics determined by the reporting laboratory.  This laboratory is certified under the Clinical Laboratory Improvement Amendments CLIA as qualified to perform high complexity clinical laboratory testing.    PIP/TAZO Value in next row Sensitive      <=4 SENSITIVEThis is a modified FDA-approved test that has been validated and its performance characteristics determined by the reporting laboratory.  This laboratory is certified under the Clinical Laboratory Improvement Amendments CLIA as qualified to perform high complexity clinical laboratory testing.    MEROPENEM Value in next row Sensitive      <=4 SENSITIVEThis is a modified FDA-approved test that has been validated and  its performance characteristics determined by the reporting laboratory.  This laboratory is certified under the Clinical Laboratory Improvement Amendments CLIA as qualified to perform high complexity clinical laboratory testing.    * >=100,000 COLONIES/mL ESCHERICHIA COLI     Labs: BNP (last 3 results) No results for input(s): BNP in the last 8760 hours. Basic Metabolic Panel: Recent Labs  Lab 04/18/24 0950 04/19/24 0503 04/20/24 0448  NA 133* 137 137  K 4.9 5.1 4.4  CL 95* 100 101  CO2 23 27 25   GLUCOSE 384* 246* 254*  BUN 20 17 16   CREATININE 0.95 1.01 0.92  CALCIUM  9.3 8.9 8.7*  MG  --  2.0 2.3   Liver Function Tests: Recent Labs  Lab 04/18/24 0950  AST 17  ALT 25  ALKPHOS 50  BILITOT 0.6  PROT 6.7  ALBUMIN 4.0   No results for input(s): LIPASE, AMYLASE in the last 168 hours. No results for input(s): AMMONIA in the last 168 hours. CBC: Recent Labs  Lab 04/18/24 0950  WBC 7.7  NEUTROABS 4.9  HGB 13.2  HCT 39.2  MCV 91.6  PLT 208   Cardiac Enzymes: No results for input(s): CKTOTAL, CKMB, CKMBINDEX, TROPONINI in the last 168 hours. BNP: Invalid input(s): POCBNP CBG: Recent Labs  Lab 04/19/24 1107 04/19/24 1636 04/19/24 2128 04/20/24 0325 04/20/24 0725  GLUCAP 296* 251* 102* 259* 268*   D-Dimer No results for input(s): DDIMER in the last 72 hours. Hgb A1c Recent Labs    04/18/24 1455  HGBA1C 16.4*   Lipid Profile No  results for input(s): CHOL, HDL, LDLCALC, TRIG, CHOLHDL, LDLDIRECT in the last 72 hours. Thyroid  function studies Recent Labs    04/18/24 1455  TSH 1.140   Anemia work up Recent Labs    04/18/24 1455  VITAMINB12 1,034*  FOLATE >20.0   Urinalysis    Component Value Date/Time   COLORURINE YELLOW 04/18/2024 0929   APPEARANCEUR HAZY (A) 04/18/2024 0929   LABSPEC 1.025 04/18/2024 0929   PHURINE 6.0 04/18/2024 0929   GLUCOSEU >=500 (A) 04/18/2024 0929   HGBUR NEGATIVE 04/18/2024 0929    BILIRUBINUR NEGATIVE 04/18/2024 0929   KETONESUR 20 (A) 04/18/2024 0929   PROTEINUR NEGATIVE 04/18/2024 0929   NITRITE NEGATIVE 04/18/2024 0929   LEUKOCYTESUR SMALL (A) 04/18/2024 0929   Sepsis Labs Recent Labs  Lab 04/18/24 0950  WBC 7.7   Microbiology Recent Results (from the past 240 hours)  Urine Culture     Status: Abnormal   Collection Time: 04/18/24 11:38 AM   Specimen: Urine, Clean Catch  Result Value Ref Range Status   Specimen Description   Final    URINE, CLEAN CATCH Performed at [facility], [address] [place], [place] [address]    Special Requests   Final    NONE Performed at [facility], [address] [place], [place] [address]    Culture (A)  Final    >=100,000 COLONIES/mL ESCHERICHIA COLI AMONG MIXED ORGANISMS Performed at [facility], [address], [place], [place] [address]    Report Status 04/20/2024 FINAL  Final   Organism ID, Bacteria ESCHERICHIA COLI (A)  Final      Susceptibility   Escherichia coli - MIC*    AMPICILLIN <=2 SENSITIVE Sensitive     CEFAZOLIN  (URINE) Value in next row Sensitive      <=1 SENSITIVEThis is a modified FDA-approved test that has been validated and its performance characteristics determined by the reporting laboratory.  This laboratory is certified under the Clinical Laboratory Improvement Amendments CLIA as qualified to perform high complexity clinical laboratory testing.    CEFEPIME Value in next row Sensitive      <=1 SENSITIVEThis is a modified FDA-approved test that has been validated and its performance characteristics determined by the reporting laboratory.  This laboratory is certified under the Clinical Laboratory Improvement Amendments CLIA as qualified to perform high complexity clinical laboratory testing.    ERTAPENEM Value in next row Sensitive      <=1 SENSITIVEThis is a modified FDA-approved test that has been validated and its performance characteristics determined by the reporting laboratory.   This laboratory is certified under the Clinical Laboratory Improvement Amendments CLIA as qualified to perform high complexity clinical laboratory testing.    CEFTRIAXONE  Value in next row Sensitive      <=1 SENSITIVEThis is a modified FDA-approved test that has been validated and its performance characteristics determined by the reporting laboratory.  This laboratory is certified under the Clinical Laboratory Improvement Amendments CLIA as qualified to perform high complexity clinical laboratory testing.    CIPROFLOXACIN Value in next row Sensitive      <=1 SENSITIVEThis is a modified FDA-approved test that has been validated and its performance characteristics determined by the reporting laboratory.  This laboratory is certified under the Clinical Laboratory Improvement Amendments CLIA as qualified to perform high complexity clinical laboratory testing.    GENTAMICIN Value in next row Sensitive      <=1 SENSITIVEThis is a modified FDA-approved test that has been validated and its performance characteristics determined by  the reporting laboratory.  This laboratory is certified under the Clinical Laboratory Improvement Amendments CLIA as qualified to perform high complexity clinical laboratory testing.    NITROFURANTOIN Value in next row Sensitive      <=1 SENSITIVEThis is a modified FDA-approved test that has been validated and its performance characteristics determined by the reporting laboratory.  This laboratory is certified under the Clinical Laboratory Improvement Amendments CLIA as qualified to perform high complexity clinical laboratory testing.    TRIMETH/SULFA Value in next row Sensitive      <=1 SENSITIVEThis is a modified FDA-approved test that has been validated and its performance characteristics determined by the reporting laboratory.  This laboratory is certified under the Clinical Laboratory Improvement Amendments CLIA as qualified to perform high complexity clinical laboratory testing.     AMPICILLIN/SULBACTAM Value in next row Sensitive      <=1 SENSITIVEThis is a modified FDA-approved test that has been validated and its performance characteristics determined by the reporting laboratory.  This laboratory is certified under the Clinical Laboratory Improvement Amendments CLIA as qualified to perform high complexity clinical laboratory testing.    PIP/TAZO Value in next row Sensitive      <=4 SENSITIVEThis is a modified FDA-approved test that has been validated and its performance characteristics determined by the reporting laboratory.  This laboratory is certified under the Clinical Laboratory Improvement Amendments CLIA as qualified to perform high complexity clinical laboratory testing.    MEROPENEM Value in next row Sensitive      <=4 SENSITIVEThis is a modified FDA-approved test that has been validated and its performance characteristics determined by the reporting laboratory.  This laboratory is certified under the Clinical Laboratory Improvement Amendments CLIA as qualified to perform high complexity clinical laboratory testing.    * >=100,000 COLONIES/mL ESCHERICHIA COLI   Time coordinating discharge:  41 mins  SIGNED:  Afton Louder, MD  [facility] 04/20/2024, 10:54 AM How to contact the [facility] Attending or Consulting provider 7A - 7P or covering provider during after hours 7P -7A, for this patient?  Check the care team in [facility] and look for a) attending/consulting TRH provider listed and b) the TRH team listed Log into [email or web address] and use [place]'s universal password to access. If you do not have the password, please contact the hospital operator. Locate the TRH provider you are looking for under [facility] and page to a number that you can be directly reached. If you still have difficulty reaching the provider, please page the [facility] (Director on Call) for the Hospitalists listed on amion for assistance.     [1] No Known Allergies

## 2024-04-20 NOTE — TOC Transition Note (Signed)
 Transition of Care [facility]) - Discharge Note   Patient Details  Name: Joe [name] MRN: [id number] Date of Birth: 05/25/1950  [facility]) CM/SW Contact:  Ronnald MARLA Sil, RN Phone Number: 04/20/2024, 12:15 PM   Clinical Narrative:    Patient discussed during morning's Progression rounds with plan to discharge home today, and Dr Vicci placed a [facility] order for RN & SW.  Patient had previously declined HH, stating he did not want anyone coming to his house, however, the primary Nurse spoke to patient and helped him to reconsider.  CM contacted patient to review HH options and patient agreed to [facility] & SW,   CM verified HH orders placed and contacted agency liaison - Delon to notify of patient's DC and [facility] insurance coverage, she indicated her agency could accept but would be reviewed on Monday for final approval.  Patient with no additional needs at this time, however CM will continue to follow along and assist as appropriate    Final next level of care: Home w Home Health Services Barriers to Discharge: No Barriers Identified   Patient Goals and CMS Choice Patient states their goals for this hospitalization and ongoing recovery are:: Return home [facility] PT & SW CMS Medicare.gov Compare Post Acute Care list provided to:: Patient Choice offered to / list presented to : Patient [place] ownership interest in [facility].provided to:: Patient   Discharge Plan and Services Additional resources added to the After Visit Summary for         DME Arranged: N/A DME Agency: NA HH Arranged: RN, Social Work [facility] Agency: [facility] Date HH Agency Contacted: 04/20/24 Time HH Agency Contacted: 1215 Representative spoke with at [facility] Agency: Delon Via text msg  Social Drivers of Health (SDOH) Interventions SDOH Screenings   Food Insecurity: No Food Insecurity (04/18/2024)  Housing: Low Risk (04/18/2024)  Transportation Needs: No Transportation Needs  (04/18/2024)  Utilities: Not At Risk (04/18/2024)  Financial Resource Strain: Low Risk (02/28/2024)   Received from [facility]  Physical Activity: Sufficiently Active (02/28/2024)   Received from [facility]  Social Connections: Moderately Integrated (04/18/2024)  Stress: No Stress Concern Present (02/28/2024)   Received from [facility]  Tobacco Use: Low Risk (04/18/2024)  Health Literacy: Low Risk (02/28/2024)   Received from [facility]     Readmission Risk Interventions    04/19/2024    1:36 PM  Readmission Risk Prevention Plan  Post Dischage Appt Not Complete  Medication Screening Complete  Transportation Screening Complete

## 2024-04-20 NOTE — Discharge Instructions (Signed)
 IMPORTANT INFORMATION: PAY CLOSE ATTENTION   PHYSICIAN DISCHARGE INSTRUCTIONS  Follow with Primary care provider  Avis Epley, PA-C  and other consultants as instructed by your Hospitalist Physician  SEEK MEDICAL CARE OR RETURN TO EMERGENCY ROOM IF SYMPTOMS COME BACK, WORSEN OR NEW PROBLEM DEVELOPS   Please note: You were cared for by a hospitalist during your hospital stay. Every effort will be made to forward records to your primary care provider.  You can request that your primary care provider send for your hospital records if they have not received them.  Once you are discharged, your primary care physician will handle any further medical issues. Please note that NO REFILLS for any discharge medications will be authorized once you are discharged, as it is imperative that you return to your primary care physician (or establish a relationship with a primary care physician if you do not have one) for your post hospital discharge needs so that they can reassess your need for medications and monitor your lab values.  Please get a complete blood count and chemistry panel checked by your Primary MD at your next visit, and again as instructed by your Primary MD.  Get Medicines reviewed and adjusted: Please take all your medications with you for your next visit with your Primary MD  Laboratory/radiological data: Please request your Primary MD to go over all hospital tests and procedure/radiological results at the follow up, please ask your primary care provider to get all Hospital records sent to his/her office.  In some cases, they will be blood work, cultures and biopsy results pending at the time of your discharge. Please request that your primary care provider follow up on these results.  If you are diabetic, please bring your blood sugar readings with you to your follow up appointment with primary care.    Please call and make your follow up appointments as soon as possible.    Also  Note the following: If you experience worsening of your admission symptoms, develop shortness of breath, life threatening emergency, suicidal or homicidal thoughts you must seek medical attention immediately by calling 911 or calling your MD immediately  if symptoms less severe.  You must read complete instructions/literature along with all the possible adverse reactions/side effects for all the Medicines you take and that have been prescribed to you. Take any new Medicines after you have completely understood and accpet all the possible adverse reactions/side effects.   Do not drive when taking Pain medications or sleeping medications (Benzodiazepines)  Do not take more than prescribed Pain, Sleep and Anxiety Medications. It is not advisable to combine anxiety,sleep and pain medications without talking with your primary care practitioner  Special Instructions: If you have smoked or chewed Tobacco  in the last 2 yrs please stop smoking, stop any regular Alcohol  and or any Recreational drug use.  Wear Seat belts while driving.  Do not drive if taking any narcotic, mind altering or controlled substances or recreational drugs or alcohol.

## 2024-04-22 ENCOUNTER — Telehealth: Payer: Self-pay | Admitting: *Deleted

## 2024-04-22 NOTE — Telephone Encounter (Signed)
 Rec'd a call from, [place]. She works at [employer]. She is wanting to know about the patient's prescriptions. At last office visit here, Joe Rio,  NP advised that the patient should not be on Insulin  at this time because of his confusion. Joe [name] was called and made aware, Refer to previous notes.  Patient was recently admitted to [facility] after being in the ED. Joe [name] states that she came in this morning to new prescriptions from another doctor who is at [facility]. She wants to verify with our office.  Joe was made aware, she advises that pharmacy may go by what the ED/discharge recommended. I have talked with Joe [name] and shared [name]'s recommendation. She will do the prescriptions for his discharge from [facility].

## 2024-04-23 ENCOUNTER — Telehealth: Payer: Self-pay | Admitting: *Deleted

## 2024-04-23 NOTE — Telephone Encounter (Signed)
 Called and let pt know about appt with [name] and appt with the dietician

## 2024-04-23 NOTE — Telephone Encounter (Signed)
 Joe [name] has left a message asking about his next appointment. He cannot remember the date. Would appreciate a call back.

## 2024-04-24 ENCOUNTER — Telehealth: Payer: Self-pay | Admitting: *Deleted

## 2024-04-24 ENCOUNTER — Other Ambulatory Visit (HOSPITAL_COMMUNITY): Payer: Self-pay

## 2024-04-24 NOTE — Telephone Encounter (Signed)
 Patient left a message with the Access Nurse after hours. He shared that he had surgery on Friday, He shared they got a new medication and insulin  that was called in by Dr. Richelle. He stated that he had not gotten his insulin .  Patient was seen in the ED recently, and was admitted. Upon his discharge the provider there prescribed medications.  We received a call from [facility] about those prescriptions earlier this week. As, [name] Reardon,NP at patient's last visit here stopped insulin  as the patient is very confused and cannot understand how to use it safely. She prescribed other medications. All of this was explained to Franklin at [place].  [name] ask that they go by the new prescriptions that they had received from [facility] visit/provider. This was shared with Rosaline and she stated that she would work on it.

## 2024-04-25 ENCOUNTER — Telehealth: Payer: Self-pay | Admitting: *Deleted

## 2024-04-25 NOTE — Telephone Encounter (Signed)
 Patient has left two messages. Both of them he is asking for help injecting his insulin . He does not know how much, but states that he has to do it with a needle. He also mentions that he has to get readings for [name].  As noted previously, Joe Rio, NP had stopped the insulin  as the patient is confused, has no understanding. This could be a danger to him. He went to the ED at [facility] over weekend and a provider there prescribed medicine to include insulin . Returned the patient's call. His voicemail box is not set up. We would like to followup and see if home health has been to see him and are helping him with his insulin .

## 2024-04-25 NOTE — Telephone Encounter (Signed)
 After not being able to reach the patient. I talked with the Provider, [name] Reardon,NP. We agreed that we should reach out to his sister, Adrien Rhyme. A call was made to her, and it was explained to her about his recent visit here and the concerns that had for the patient to take Insulin , due to his memory issues. Shared the calls that he leaves and how we return those calls only to hear that the patient has not set up his voicemail.  Suggested that she reach out to his PCP Dr. Marvine or [facility]. Number provided.

## 2024-04-26 ENCOUNTER — Telehealth: Payer: Self-pay | Admitting: *Deleted

## 2024-04-26 NOTE — Telephone Encounter (Signed)
 Patient left a message with the after hours access. That he had missed a call from the office to cut back on the metformin  and he is confirming a cut back.  Patient was called and no answer, rang multiple times.

## 2024-05-02 ENCOUNTER — Encounter: Admitting: Nutrition

## 2024-05-29 ENCOUNTER — Ambulatory Visit: Admitting: Nurse Practitioner
